# Patient Record
Sex: Male | Born: 1935 | Race: White | Hispanic: No | Smoking: Never smoker
Health system: Southern US, Community
[De-identification: ages and names within clinical notes are randomized; demographics above are authoritative.]

## PROBLEM LIST (undated history)

## (undated) DIAGNOSIS — C801 Malignant (primary) neoplasm, unspecified: Secondary | ICD-10-CM

## (undated) DIAGNOSIS — N400 Enlarged prostate without lower urinary tract symptoms: Secondary | ICD-10-CM

## (undated) DIAGNOSIS — R112 Nausea with vomiting, unspecified: Secondary | ICD-10-CM

## (undated) DIAGNOSIS — I499 Cardiac arrhythmia, unspecified: Secondary | ICD-10-CM

## (undated) DIAGNOSIS — E785 Hyperlipidemia, unspecified: Secondary | ICD-10-CM

## (undated) DIAGNOSIS — Z9889 Other specified postprocedural states: Secondary | ICD-10-CM

## (undated) DIAGNOSIS — H409 Unspecified glaucoma: Secondary | ICD-10-CM

## (undated) DIAGNOSIS — S92101A Unspecified fracture of right talus, initial encounter for closed fracture: Secondary | ICD-10-CM

## (undated) DIAGNOSIS — I10 Essential (primary) hypertension: Secondary | ICD-10-CM

## (undated) HISTORY — DX: Hyperlipidemia, unspecified: E78.5

## (undated) HISTORY — DX: Malignant (primary) neoplasm, unspecified: C80.1

## (undated) HISTORY — PX: BLADDER SURGERY: SHX569

## (undated) HISTORY — PX: TONSILLECTOMY AND ADENOIDECTOMY: SUR1326

## (undated) HISTORY — DX: Essential (primary) hypertension: I10

## (undated) HISTORY — DX: Cardiac arrhythmia, unspecified: I49.9

## (undated) HISTORY — DX: Benign prostatic hyperplasia without lower urinary tract symptoms: N40.0

---

## 2008-12-30 ENCOUNTER — Inpatient Hospital Stay (HOSPITAL_COMMUNITY): Admission: RE | Admit: 2008-12-30 | Discharge: 2009-01-01 | Payer: Self-pay | Admitting: Urology

## 2008-12-30 ENCOUNTER — Encounter (INDEPENDENT_AMBULATORY_CARE_PROVIDER_SITE_OTHER): Payer: Self-pay | Admitting: Urology

## 2009-02-01 ENCOUNTER — Ambulatory Visit: Admission: RE | Admit: 2009-02-01 | Discharge: 2009-05-02 | Payer: Self-pay | Admitting: Radiation Oncology

## 2009-02-04 ENCOUNTER — Ambulatory Visit: Payer: Self-pay | Admitting: Oncology

## 2009-02-15 ENCOUNTER — Encounter (INDEPENDENT_AMBULATORY_CARE_PROVIDER_SITE_OTHER): Payer: Self-pay | Admitting: Urology

## 2009-02-15 ENCOUNTER — Ambulatory Visit (HOSPITAL_COMMUNITY): Admission: RE | Admit: 2009-02-15 | Discharge: 2009-02-16 | Payer: Self-pay | Admitting: Urology

## 2009-03-05 LAB — CBC WITH DIFFERENTIAL/PLATELET
Basophils Absolute: 0 10*3/uL (ref 0.0–0.1)
EOS%: 3.4 % (ref 0.0–7.0)
Eosinophils Absolute: 0.3 10*3/uL (ref 0.0–0.5)
HCT: 43 % (ref 38.4–49.9)
HGB: 14.8 g/dL (ref 13.0–17.1)
MCH: 33 pg (ref 27.2–33.4)
MONO#: 0.6 10*3/uL (ref 0.1–0.9)
NEUT#: 4.7 10*3/uL (ref 1.5–6.5)
NEUT%: 62 % (ref 39.0–75.0)
RDW: 14.2 % (ref 11.0–14.6)
WBC: 7.6 10*3/uL (ref 4.0–10.3)
lymph#: 2 10*3/uL (ref 0.9–3.3)

## 2009-03-05 LAB — COMPREHENSIVE METABOLIC PANEL
AST: 14 U/L (ref 0–37)
BUN: 17 mg/dL (ref 6–23)
Calcium: 9 mg/dL (ref 8.4–10.5)
Chloride: 107 mEq/L (ref 96–112)
Creatinine, Ser: 0.97 mg/dL (ref 0.40–1.50)
Total Bilirubin: 0.8 mg/dL (ref 0.3–1.2)

## 2009-03-05 LAB — LACTATE DEHYDROGENASE: LDH: 149 U/L (ref 94–250)

## 2009-03-17 ENCOUNTER — Ambulatory Visit: Payer: Self-pay | Admitting: Oncology

## 2009-03-19 LAB — COMPREHENSIVE METABOLIC PANEL
Albumin: 4.1 g/dL (ref 3.5–5.2)
BUN: 17 mg/dL (ref 6–23)
Calcium: 8.5 mg/dL (ref 8.4–10.5)
Chloride: 107 mEq/L (ref 96–112)
Glucose, Bld: 155 mg/dL — ABNORMAL HIGH (ref 70–99)
Potassium: 3.4 mEq/L — ABNORMAL LOW (ref 3.5–5.3)

## 2009-03-19 LAB — CBC WITH DIFFERENTIAL/PLATELET
Basophils Absolute: 0 10*3/uL (ref 0.0–0.1)
Eosinophils Absolute: 0.2 10*3/uL (ref 0.0–0.5)
HGB: 14.7 g/dL (ref 13.0–17.1)
MCV: 94.4 fL (ref 79.3–98.0)
NEUT#: 3.8 10*3/uL (ref 1.5–6.5)
RDW: 13.9 % (ref 11.0–14.6)
lymph#: 1.1 10*3/uL (ref 0.9–3.3)

## 2009-04-02 LAB — CBC WITH DIFFERENTIAL/PLATELET
Basophils Absolute: 0 10*3/uL (ref 0.0–0.1)
EOS%: 3.2 % (ref 0.0–7.0)
Eosinophils Absolute: 0.2 10*3/uL (ref 0.0–0.5)
HCT: 41 % (ref 38.4–49.9)
HGB: 14.7 g/dL (ref 13.0–17.1)
MCH: 34.5 pg — ABNORMAL HIGH (ref 27.2–33.4)
MCV: 96.2 fL (ref 79.3–98.0)
NEUT#: 3.1 10*3/uL (ref 1.5–6.5)
NEUT%: 61.4 % (ref 39.0–75.0)
lymph#: 1.2 10*3/uL (ref 0.9–3.3)

## 2009-04-02 LAB — COMPREHENSIVE METABOLIC PANEL
AST: 15 U/L (ref 0–37)
Albumin: 4.1 g/dL (ref 3.5–5.2)
BUN: 16 mg/dL (ref 6–23)
Calcium: 8.3 mg/dL — ABNORMAL LOW (ref 8.4–10.5)
Chloride: 107 mEq/L (ref 96–112)
Creatinine, Ser: 1.12 mg/dL (ref 0.40–1.50)
Glucose, Bld: 98 mg/dL (ref 70–99)
Potassium: 4 mEq/L (ref 3.5–5.3)

## 2009-04-27 ENCOUNTER — Ambulatory Visit: Payer: Self-pay | Admitting: Oncology

## 2009-04-27 LAB — CBC WITH DIFFERENTIAL/PLATELET
BASO%: 0.3 % (ref 0.0–2.0)
Basophils Absolute: 0 10*3/uL (ref 0.0–0.1)
HCT: 38.2 % — ABNORMAL LOW (ref 38.4–49.9)
HGB: 13.8 g/dL (ref 13.0–17.1)
MONO#: 0.4 10*3/uL (ref 0.1–0.9)
NEUT%: 68 % (ref 39.0–75.0)
RDW: 17.3 % — ABNORMAL HIGH (ref 11.0–14.6)
WBC: 4.1 10*3/uL (ref 4.0–10.3)
lymph#: 0.8 10*3/uL — ABNORMAL LOW (ref 0.9–3.3)

## 2009-05-03 ENCOUNTER — Ambulatory Visit: Admission: RE | Admit: 2009-05-03 | Discharge: 2009-06-22 | Payer: Self-pay | Admitting: Radiation Oncology

## 2009-05-28 ENCOUNTER — Ambulatory Visit: Payer: Self-pay | Admitting: Oncology

## 2009-05-28 LAB — CBC WITH DIFFERENTIAL/PLATELET
Basophils Absolute: 0 10*3/uL (ref 0.0–0.1)
Eosinophils Absolute: 0.1 10*3/uL (ref 0.0–0.5)
HGB: 14.3 g/dL (ref 13.0–17.1)
MCV: 98.4 fL — ABNORMAL HIGH (ref 79.3–98.0)
MONO#: 0.5 10*3/uL (ref 0.1–0.9)
MONO%: 9.8 % (ref 0.0–14.0)
NEUT#: 3.3 10*3/uL (ref 1.5–6.5)
RDW: 15.8 % — ABNORMAL HIGH (ref 11.0–14.6)
WBC: 4.8 10*3/uL (ref 4.0–10.3)
lymph#: 0.9 10*3/uL (ref 0.9–3.3)

## 2009-05-28 LAB — COMPREHENSIVE METABOLIC PANEL
BUN: 13 mg/dL (ref 6–23)
CO2: 21 mEq/L (ref 19–32)
Calcium: 8.4 mg/dL (ref 8.4–10.5)
Chloride: 110 mEq/L (ref 96–112)
Creatinine, Ser: 0.99 mg/dL (ref 0.40–1.50)
Glucose, Bld: 109 mg/dL — ABNORMAL HIGH (ref 70–99)

## 2009-06-23 ENCOUNTER — Ambulatory Visit (HOSPITAL_COMMUNITY): Admission: RE | Admit: 2009-06-23 | Discharge: 2009-06-23 | Payer: Self-pay | Admitting: Oncology

## 2009-06-24 ENCOUNTER — Ambulatory Visit: Payer: Self-pay | Admitting: Oncology

## 2009-06-29 LAB — CBC WITH DIFFERENTIAL/PLATELET
Basophils Absolute: 0 10*3/uL (ref 0.0–0.1)
EOS%: 4 % (ref 0.0–7.0)
HCT: 42.4 % (ref 38.4–49.9)
HGB: 15.1 g/dL (ref 13.0–17.1)
MCH: 34.6 pg — ABNORMAL HIGH (ref 27.2–33.4)
MCV: 97.2 fL (ref 79.3–98.0)
MONO%: 9.9 % (ref 0.0–14.0)
NEUT%: 66.6 % (ref 39.0–75.0)
lymph#: 1 10*3/uL (ref 0.9–3.3)

## 2009-06-29 LAB — COMPREHENSIVE METABOLIC PANEL
AST: 16 U/L (ref 0–37)
BUN: 16 mg/dL (ref 6–23)
Calcium: 8.6 mg/dL (ref 8.4–10.5)
Chloride: 109 mEq/L (ref 96–112)
Creatinine, Ser: 0.97 mg/dL (ref 0.40–1.50)
Glucose, Bld: 97 mg/dL (ref 70–99)

## 2009-07-12 ENCOUNTER — Encounter (INDEPENDENT_AMBULATORY_CARE_PROVIDER_SITE_OTHER): Payer: Self-pay | Admitting: Urology

## 2009-07-12 ENCOUNTER — Ambulatory Visit (HOSPITAL_BASED_OUTPATIENT_CLINIC_OR_DEPARTMENT_OTHER): Admission: RE | Admit: 2009-07-12 | Discharge: 2009-07-12 | Payer: Self-pay | Admitting: Urology

## 2009-07-14 ENCOUNTER — Emergency Department (HOSPITAL_COMMUNITY): Admission: EM | Admit: 2009-07-14 | Discharge: 2009-07-14 | Payer: Self-pay | Admitting: Emergency Medicine

## 2010-08-22 ENCOUNTER — Ambulatory Visit (HOSPITAL_BASED_OUTPATIENT_CLINIC_OR_DEPARTMENT_OTHER): Admission: RE | Admit: 2010-08-22 | Discharge: 2010-08-23 | Payer: Self-pay | Admitting: Urology

## 2011-01-04 LAB — POCT I-STAT 4, (NA,K, GLUC, HGB,HCT)
Glucose, Bld: 102 mg/dL — ABNORMAL HIGH (ref 70–99)
HCT: 47 % (ref 39.0–52.0)
Hemoglobin: 16 g/dL (ref 13.0–17.0)
Potassium: 4.3 meq/L (ref 3.5–5.1)
Sodium: 143 meq/L (ref 135–145)

## 2011-01-09 ENCOUNTER — Ambulatory Visit (INDEPENDENT_AMBULATORY_CARE_PROVIDER_SITE_OTHER): Payer: Medicare Other | Admitting: Cardiology

## 2011-01-09 DIAGNOSIS — I119 Hypertensive heart disease without heart failure: Secondary | ICD-10-CM

## 2011-01-09 DIAGNOSIS — C679 Malignant neoplasm of bladder, unspecified: Secondary | ICD-10-CM

## 2011-01-09 DIAGNOSIS — E781 Pure hyperglyceridemia: Secondary | ICD-10-CM

## 2011-01-12 ENCOUNTER — Encounter: Payer: Self-pay | Admitting: Cardiology

## 2011-01-27 LAB — BASIC METABOLIC PANEL
BUN: 17 mg/dL (ref 6–23)
CO2: 25 mEq/L (ref 19–32)
Chloride: 110 mEq/L (ref 96–112)
Creatinine, Ser: 1.09 mg/dL (ref 0.4–1.5)
Glucose, Bld: 97 mg/dL (ref 70–99)

## 2011-01-27 LAB — DIFFERENTIAL
Basophils Relative: 0 % (ref 0–1)
Eosinophils Absolute: 0.1 10*3/uL (ref 0.0–0.7)
Monocytes Absolute: 0.5 10*3/uL (ref 0.1–1.0)
Monocytes Relative: 9 % (ref 3–12)
Neutro Abs: 4.3 10*3/uL (ref 1.7–7.7)

## 2011-01-27 LAB — POCT I-STAT 4, (NA,K, GLUC, HGB,HCT)
Potassium: 3.7 mEq/L (ref 3.5–5.1)
Sodium: 141 mEq/L (ref 135–145)

## 2011-01-27 LAB — CBC
MCHC: 34.3 g/dL (ref 30.0–36.0)
MCV: 100.1 fL — ABNORMAL HIGH (ref 78.0–100.0)
Platelets: 141 10*3/uL — ABNORMAL LOW (ref 150–400)

## 2011-01-27 LAB — CK TOTAL AND CKMB (NOT AT ARMC): Relative Index: INVALID (ref 0.0–2.5)

## 2011-01-30 ENCOUNTER — Other Ambulatory Visit: Payer: Self-pay | Admitting: Urology

## 2011-01-30 ENCOUNTER — Ambulatory Visit (HOSPITAL_BASED_OUTPATIENT_CLINIC_OR_DEPARTMENT_OTHER)
Admission: RE | Admit: 2011-01-30 | Discharge: 2011-01-30 | Disposition: A | Discharge status: Home / Self Care | Payer: Medicare Other | Source: Ambulatory Visit | Attending: Urology | Admitting: Urology

## 2011-01-30 DIAGNOSIS — C679 Malignant neoplasm of bladder, unspecified: Secondary | ICD-10-CM | POA: Insufficient documentation

## 2011-01-30 DIAGNOSIS — Z01812 Encounter for preprocedural laboratory examination: Secondary | ICD-10-CM | POA: Insufficient documentation

## 2011-01-30 DIAGNOSIS — Z79899 Other long term (current) drug therapy: Secondary | ICD-10-CM | POA: Insufficient documentation

## 2011-01-30 DIAGNOSIS — C669 Malignant neoplasm of unspecified ureter: Secondary | ICD-10-CM | POA: Insufficient documentation

## 2011-01-30 DIAGNOSIS — K219 Gastro-esophageal reflux disease without esophagitis: Secondary | ICD-10-CM | POA: Insufficient documentation

## 2011-01-30 DIAGNOSIS — I10 Essential (primary) hypertension: Secondary | ICD-10-CM | POA: Insufficient documentation

## 2011-01-30 DIAGNOSIS — C659 Malignant neoplasm of unspecified renal pelvis: Secondary | ICD-10-CM | POA: Insufficient documentation

## 2011-01-30 LAB — POCT I-STAT 4, (NA,K, GLUC, HGB,HCT)
Glucose, Bld: 97 mg/dL (ref 70–99)
HCT: 44 % (ref 39.0–52.0)
Hemoglobin: 15 g/dL (ref 13.0–17.0)
Sodium: 143 mEq/L (ref 135–145)

## 2011-02-01 LAB — BASIC METABOLIC PANEL
BUN: 11 mg/dL (ref 6–23)
Calcium: 8.4 mg/dL (ref 8.4–10.5)
GFR calc non Af Amer: >60 mL/min (ref >60)
Potassium: 3.6 mEq/L (ref 3.5–5.1)

## 2011-02-01 LAB — HEMOGLOBIN AND HEMATOCRIT, BLOOD: Hemoglobin: 13.7 g/dL (ref 13.0–17.0)

## 2011-02-02 LAB — BASIC METABOLIC PANEL
BUN: 10 mg/dL (ref 6–23)
BUN: 17 mg/dL (ref 6–23)
CO2: 24 mEq/L (ref 19–32)
Calcium: 7.8 mg/dL — ABNORMAL LOW (ref 8.4–10.5)
Calcium: 9.2 mg/dL (ref 8.4–10.5)
Chloride: 113 mEq/L — ABNORMAL HIGH (ref 96–112)
Creatinine, Ser: 1.05 mg/dL (ref 0.4–1.5)
Creatinine, Ser: 1.1 mg/dL (ref 0.4–1.5)
GFR calc Af Amer: >60 mL/min (ref >60)
GFR calc non Af Amer: >60 mL/min (ref >60)
Glucose, Bld: 106 mg/dL — ABNORMAL HIGH (ref 70–99)
Glucose, Bld: 122 mg/dL — ABNORMAL HIGH (ref 70–99)
Sodium: 142 mEq/L (ref 135–145)

## 2011-02-15 NOTE — Op Note (Signed)
[name], [name]                 ACCOUNT NO.:  [id number]  MEDICAL RECORD NO.:  [id number]          PATIENT TYPE:  AMB  LOCATION:  [facility]                         FACILITY:  [facility]  PHYSICIAN:  Valetta Fuller, M.D.  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  PROCEDURE PERFORMED:  Cystoscopy, left double-J stent removal, left retrograde pyelogram, rigid and flexible ureteroscopy of the left collecting system,  washings for cytology of the bladder and left renal pelvis.  SURGEON:  Valetta Fuller, M.D.  ANESTHESIA:  General.  INDICATIONS:  Mr. [name] is 77 years of age.  He has a history of transitional cell carcinoma of the bladder as well as some low-grade papillary tumor involving his left proximal ureter.  The patient underwent previous ureteroscopic assessment with biopsy and fulguration. The patient  did not want more aggressive treatment for his transitional cell carcinoma.  The patient has also had recurrent transitional cell carcinoma of the bladder and on his last biopsy did have low-grade papillary transitional cell carcinoma.  The patient is status post a 6- week course of BCG therapy with hope that he would have some reflux of the BCG up his double-J stent.  He has done well clinically and presents now for reassessment.  TECHNIQUE AND FINDINGS:  The patient was brought to the operating room where he had successful induction of general anesthesia.  He received perioperative ciprofloxacin and PAS compression boots.  He was placed in lithotomy position and prepped and draped in the usual manner. Appropriate surgical time-out was performed.  A cystoscopy was performed.  The patient had  minimal visual obstruction at the level of the prostatic urethra.  Bladder neck was otherwise relatively open.  Left double-J stent was seen in good position with just some mild erythema and mucosal edema around the orifice.  The bladder was carefully panendoscoped and no evidence of obvious recurrent tumor was noted.  A saline barbotage was done for cytologic analysis.  The left double-J stent was removed.  We then performed retrograde pyelography of the left ureter with fluoroscopic interpretation.  There was no evidence of obvious filling defect or obstruction.  A guidewire was placed up to the left renal pelvis.  Rigid ureteroscopy was utilized to assess the distal ureter and there was no evidence of obvious tumor.  An access sheath was placed and the digital flexible ureteroscope was then utilized to assess the proximal ureter and collecting system.  There were some areas of increased mucosal edema and erythema mostly consistent with inflammatory change.  I could see no definitive evidence of any residual papillary tumor.  Saline barbotage was utilized after removal of the flexible ureteroscope with an open-ended catheter to obtain cytology from the left renal pelvis.  We did not feel that left double-J stent replacement was necessary and the patient was brought to recovery room in stable  condition.     Valetta Fuller, M.D.     DSG/MEDQ  D:  01/30/2011  T:  01/30/2011  Job:  [id number]  Electronically Signed by Barron Alvine M.D. on 02/15/2011 11:21:52 AM

## 2011-02-22 ENCOUNTER — Other Ambulatory Visit: Payer: Self-pay | Admitting: *Deleted

## 2011-02-22 DIAGNOSIS — I10 Essential (primary) hypertension: Secondary | ICD-10-CM

## 2011-02-22 MED ORDER — LISINOPRIL 10 MG PO TABS: 10.0000 mg | ORAL_TABLET | Freq: Two times a day (BID) | ORAL | Status: DC

## 2011-02-22 NOTE — Telephone Encounter (Signed)
Refilled rx. For lisinopril 10mg  bid

## 2011-03-07 NOTE — Op Note (Signed)
[name], [name]                 ACCOUNT NO.:  [id number]   MEDICAL RECORD NO.:  [id number]          PATIENT TYPE:  OIB   LOCATION:  1610                         FACILITY:  [facility]   PHYSICIAN:  Valetta Fuller, M.D.  DATE OF BIRTH:  04-15-1923   DATE OF PROCEDURE:  DATE OF DISCHARGE:                               OPERATIVE REPORT   PREOPERATIVE DIAGNOSIS:  Muscle-invasive bladder cancer.   POSTOPERATIVE DIAGNOSIS:  Muscle-invasive bladder cancer.   PROCEDURE PERFORMED:  1. Cystourethroscopy.  2. Exchange of left ureteral stent.  3. Removal of right ureteral stent.  4. Transurethral resection of bladder tumor, medium size.   SURGEON:  Valetta Fuller, M.D.   RESIDENT:  [name] __________.   ANESTHESIA:  General.   DRAINS:  A 22 French 3-way Foley catheter.   ESTIMATED BLOOD LOSS:  Minimal.   COMPLICATIONS:  None.   INDICATIONS FOR PROCEDURE:  Patient is an 77 year old gentleman who is  status post TURBT about 4 weeks ago, at which point the last tumor  burden was seen involving the trigone, the left lateral bladder wall,  and also the right trigone.  The patient's pathology from last year  showed muscle-invasive bladder cancer.  At that resection, patient  apparently had ureteral stents placed.  Patient was most recently  evaluated by Dr. Isabel Caprice in the clinic, at which point, different  treatment options were discussed.  Concerning the patient's  comorbidities and age, it was decided that the patient will undergo  chemoradiation, for which he has been referred to the radiation  oncologist.  Also, it was decided that the patient will undergo second-  look TURBT to assess residual tumor burden and left stent change.  The  risks and benefits of the procedure were explained, and informed consent  obtained.   DESCRIPTION OF PROCEDURE IN DETAIL:  Patient was brought to the  operating room, placed in supine position.  Anesthesia was administered  by the anesthesia team.  A  proper time-out was performed, identifying  the correct patient, procedure, and the site.  Patient was given a  preoperative antibiotics.  Patient was subsequently placed in the dorsal  lithotomy position.  Pressure points were well padded.  Patient was  appropriately draped in the usual sterile manner.  Using a 22 French  sheath and a 12-degree lens, we then performed cystourethroscopy.  The  patient's anterior urethra was normal.  The posterior urethra revealed  moderate bilevel prostatic enlargement.  Upon entering the bladder, a  distal course of bilateral ureteral stents were seen fluxing the right  and left ureteral orifices.  On careful inspection, it was seen that he  had 2 cm sized papillary lesions along the lateral aspect of the left  ureteral orifice.  Also, erythematous and edematous mucosa was seen  around the left ureteral orifice and left trigone and left lateral  bladder wall.  Then using a flexible forceps, externalized the lower  uterine stent and over a Glidewire, changed the left ureteral stent to a  7x 24cm contour stent.  Good proximal curl was seen.  We then removed  the right ureteral stent.  We then performed urethral dilatation using  male urethral sounds to a 30 [place].  We then introduced a 28 French  resectoscope sheath over an obturator and introduced the resectoscope.  We then sequentially performed resection to the 1 cm papillary lesion  along the lateral aspect of the left ureteral orifice.  We then also  resected erythematous mucosa along the medial aspect of the left  ureteral orifice.  We then fulgurated all the concerned areas of  resection, the base and the edges, and the erythematous mucosa, as  described above.  We then examined the area, and there was no active  bleeding.  We then emptied the patient's bladder, removed the  resectoscope, and placed a [place] 3-way Foley catheter.  Irrigated the  bladder.  This marked the end of the procedure.   Patient was  subsequently transferred in stable condition to the recovery room.  Dr.  Isabel Caprice was present and available for all the aspects of the case.      Delman Kitten, MD      Valetta Fuller, M.D.  Electronically Signed    DW/MEDQ  D:  02/15/2009  T:  02/15/2009  Job:  [id number]

## 2011-03-07 NOTE — Discharge Summary (Signed)
NAMEFIN, HUPP                 ACCOUNT NO.:  [id number]   MEDICAL RECORD NO.:  [id number]          PATIENT TYPE:  INP   LOCATION:  1444                         FACILITY:  [facility]   PHYSICIAN:  Valetta Fuller, M.D.  DATE OF BIRTH:  [birth year]   DATE OF ADMISSION:  12/30/2008  DATE OF DISCHARGE:  01/01/2009                               DISCHARGE SUMMARY   ADMISSION DIAGNOSIS:  Bladder mass.   DISCHARGE DIAGNOSIS:  Bladder cancer.   SERVICE:  Patient was admitted under Dr. Isabel Caprice, Urology.   CONSULTS:  Home health services was requested.   PROCEDURES:  Patient underwent cystourethroscopy, bilateral ureteral  stent placement, left retrograde pyelogram and transurethral resection  of bladder tumor on 12/30/2008.   HISTORY:  Patient is an 77 year old gentleman who was evaluated by Dr.  Isabel Caprice in the Urology Clinic for recurrent microhematuria.  Patient had  an office cystoscopy which revealed extensive clots in his bladder.  The  patient was consulted about different treatment options, was brought in  now to explain risks and benefits and to obtain informed consent for  cystourethroscopy, bilateral retrograde and transurethral resection of  bladder tumor.   COURSE:  Patient underwent uncomplicated procedure on 12/30/2008.  Patient during the surgery had bilateral ureteral stents placed and  extensive resection of bladder mass.  Postoperatively, patient after a  brief stay in recovery room was transferred to regular floor bed.  Patient postoperatively had a 3-way Foley catheter placed status post  bladder irrigation.  His postoperative course was uncomplicated.  His  continuous bladder irrigation was weaned off on the morning of  postoperative day.  Patient, however, because of his extensive nature of  bladder involvement by the mass underwent a CT scan of the abdomen and  pelvis and chest x-ray for metastatic workup.  The CT scan of the  abdomen and pelvis revealed bilateral  atelectasis with bilateral  hydronephrosis, although there was Foley catheter in place in the  bladder with decompressed bladder.  There was some concern about  contrast extravasating in the retro-pubic space.  This was discussed  with the patient and his family.  At that point, decision was made to  send patient home with Foley catheter to straight drain.  The urine was  clear requiring no irrigation.  The patient has poor social support, is  being taken care of by his sister as this point so home health services  were consulted.   Discharge condition  Stable.   DISPOSITION:   MEDICATIONS:  The patient was sent home on:  1. Vicodin.  2. Ciprofloxacin.  3. Colace.   INSTRUCTIONS:  Patient has been instructed to give [place] call if he  develops a high grade temperature of 101.5, intractable nausea,  vomiting, intolerable pain, any difficulty with catheter.   FOLLOWUP:  Patient will be scheduled a followup appointment with Dr.  Isabel Caprice in 1-2 weeks for catheter removal.     ______________________________  Georgeanna Lea, MD      Valetta Fuller, M.D.  Electronically Signed    JJ/MEDQ  D:  01/27/2009  T:  01/27/2009  Job:  [id number]

## 2011-03-07 NOTE — Op Note (Signed)
[name], KNEE                 ACCOUNT NO.:  [id number]   MEDICAL RECORD NO.:  [id number]          PATIENT TYPE:  AMB   LOCATION:  DAY                          [facility]AC[facility]   PHYSICIAN:  Valetta Fuller, M.D.  DATE OF BIRTH:  1923/07/10   DATE OF PROCEDURE:  12/30/2008  DATE OF DISCHARGE:                               OPERATIVE REPORT   PREOPERATIVE DIAGNOSIS:  Bladder mass.   POSTOPERATIVE DIAGNOSIS:  Bladder mass.   PROCEDURE PERFORMED:  1. Cystourethroscopy.  2. Bilateral ureteral stent placement, double J.  3. Left retrograde pyelogram.  4. Transurethral resection of bladder tumor, large greater than 5 cm.   SURGEON:  Valetta Fuller, MD.   RESIDENT:  Georgeanna Lea, MD.   ANESTHESIA:  General.   COMPLICATIONS:  None.   DRAIN:  24-French 3-way Foley catheter.   INDICATIONS FOR PROCEDURE:  The patient is an 77 year old gentleman, who  was recently evaluated for complaints of recurrent microhematuria.  The  patient 4 or 5 years ago had a workup for microhematuria, which was  negative.  This time about a month ago was seen in the Urology Clinic at  which point he had a pathology, which was atypical cells and offered  cystoscopy, which revealed bladder mass involving the left trigone and  bladder wall area extending into the bladder neck area.  The patient was  counseled about different treatment options but explained would probably  need an extensive resection with or without ureteral stents.  The risks  and benefits of the procedure were explained and informed consent  obtained.   DESCRIPTION OF PROCEDURE IN DETAIL:  The patient was brought to the  operating room.  Placed in the supine position.  Administered general  anesthesia by the Anesthesia Team.  Appropriate time-out was performed  to identify correct the patient, procedure, and the site.  The patient  was subsequently placed in the dorsal lithotomy position and pressure  points were well-padded,  bilateral lower extremities SCDs were applied.  The patient was prepped and draped in the usual sterile fashion.  Using  a 22-French sheath and a 12-degree lens, we then performed  cystourethroscopy.  The patient's anterior urethra was normal.  The  posterior urethra revealed a moderate to severe bilobar prostate  enlargement.  Upon entering the bladder, at the bladder neck we could  see papillary tissue coming out of the left side of the bladder neck  area.  Once we entered the bladder, we could appreciate a large  papillary lesion involving the trigone, the left lateral bladder wall,  base of the bladder extending onto the posterior bladder wall.  With  much difficulty after giving patient an indigo carmine, then we were  able to identify the right ureteric orifice.  The rest of the cystoscopy  did not reveal any mass lesion or stone.  We cannulated the right  ureteric orifice with glide-tip guidewire and advanced a 4.8 into 24-cm  stent in the right renal pelvis under fluoroscopic guidance.  We then  looked for the left ureteric orifice, however, we were not able  to  identify the left ureteric orifice.  At that point, we removed the  cystoscope.  We then performed urethral dilatation with the urethral  meatal sounds, sounding from 28 to 32 [place].  We then were able to  advance a 28-French resectoscopic sheath into the bladder.  We then  placed the rest of the resectoscope up the bladder.  With continuous  flow, we then reevaluated the tissue, again confirmed the bladder mass.  We then sequentially performed resection of this bladder mass starting  at the left lateral edge.  From time to time, we were able to resect the  bladder mass and remove it and then obtain coagulation.  We kept looking  for a left ureteric orifice.  The patient had to be given indigo carmine  once more and once we had most of the bladder mass and bleeding was  coagulated, we were able to find a pretty dilated  opening at the spot  where the left ureteric orifice would be.  We then removed the  resectoscope, repositioned the cystoscope, and performed a left  retrograde pyelogram after cannulating what seemed like the left  ureteric orifice.  The left retrograde pyelogram revealed a dilated  system.  The contrast initially could just go up to the midureter.  We  then pulled the Glidewire, advanced the end-hole catheter up to be in  the left renal pelvis, and performed the rest of the pyelogram, again  revealing a dilated tortuous system.  We then placed a Glidewire into  the left renal pelvis and again over the Glidewire we were able to place  six 24 double-J ureteral stent into the left renal pelvis under  fluoroscopy.  We then once again examined the resection site for any  residual tumor.  Any residual tumor that was seen was coagulated.  We  also coagulated the base and the edge of the resection.  There was no  active bleeding seen.  At this point, we removed the resectoscope and  placed a 24-French 3-way Foley catheter and irrigated until light pink.  This marked the end of the procedure.  The patient was subsequently  extubated and transferred in stable condition to the recovery room.  Note that Dr. Isabel Caprice was present and available for all the aspects of  the case.     ______________________________  Lovie Macadamia, M.D.      Valetta Fuller, M.D.  Electronically Signed    JJ/MEDQ  D:  12/30/2008  T:  12/30/2008  Job:  [id number]

## 2011-06-07 ENCOUNTER — Other Ambulatory Visit: Payer: Self-pay | Admitting: Cardiology

## 2011-06-07 DIAGNOSIS — I10 Essential (primary) hypertension: Secondary | ICD-10-CM

## 2011-06-07 DIAGNOSIS — E785 Hyperlipidemia, unspecified: Secondary | ICD-10-CM

## 2011-06-07 DIAGNOSIS — I499 Cardiac arrhythmia, unspecified: Secondary | ICD-10-CM

## 2011-06-07 DIAGNOSIS — N400 Enlarged prostate without lower urinary tract symptoms: Secondary | ICD-10-CM

## 2011-06-09 ENCOUNTER — Ambulatory Visit (INDEPENDENT_AMBULATORY_CARE_PROVIDER_SITE_OTHER): Payer: Medicare Other | Admitting: Cardiology

## 2011-06-09 ENCOUNTER — Other Ambulatory Visit (INDEPENDENT_AMBULATORY_CARE_PROVIDER_SITE_OTHER): Payer: Medicare Other | Admitting: *Deleted

## 2011-06-09 ENCOUNTER — Other Ambulatory Visit: Payer: Medicare Other | Admitting: *Deleted

## 2011-06-09 ENCOUNTER — Encounter: Payer: Self-pay | Admitting: Cardiology

## 2011-06-09 VITALS — BP 136/80 | HR 72 | Wt 179.0 lb

## 2011-06-09 DIAGNOSIS — I10 Essential (primary) hypertension: Secondary | ICD-10-CM

## 2011-06-09 DIAGNOSIS — E785 Hyperlipidemia, unspecified: Secondary | ICD-10-CM

## 2011-06-09 DIAGNOSIS — I493 Ventricular premature depolarization: Secondary | ICD-10-CM

## 2011-06-09 DIAGNOSIS — N400 Enlarged prostate without lower urinary tract symptoms: Secondary | ICD-10-CM

## 2011-06-09 DIAGNOSIS — I4949 Other premature depolarization: Secondary | ICD-10-CM

## 2011-06-09 DIAGNOSIS — I499 Cardiac arrhythmia, unspecified: Secondary | ICD-10-CM

## 2011-06-09 LAB — HEPATIC FUNCTION PANEL
ALT: 20 U/L (ref 0–53)
AST: 16 U/L (ref 0–37)
Albumin: 4 g/dL (ref 3.5–5.2)
Alkaline Phosphatase: 45 U/L (ref 39–117)
Total Protein: 6.8 g/dL (ref 6.0–8.3)

## 2011-06-09 LAB — BASIC METABOLIC PANEL
CO2: 29 mEq/L (ref 19–32)
Calcium: 8.7 mg/dL (ref 8.4–10.5)
Chloride: 108 mEq/L (ref 96–112)
Glucose, Bld: 103 mg/dL — ABNORMAL HIGH (ref 70–99)
Potassium: 4.5 mEq/L (ref 3.5–5.1)
Sodium: 142 mEq/L (ref 135–145)

## 2011-06-09 LAB — LIPID PANEL: HDL: 33.3 mg/dL — ABNORMAL LOW (ref >39.00)

## 2011-06-09 NOTE — Progress Notes (Signed)
Bayard Hugger Date of Birth:  December 13, 1922 [facility] / [facility] [address]   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  He has a past history of asymptomatic PVCs and a past history of hypercholesterolemia.  He also has a history of an enlarged prostate.  He's also had a past history of bladder cancer treated by Dr. Isabel Caprice.  He denies any chest pain or shortness of breath.He has had no dizziness or syncope  Current Outpatient Prescriptions  Medication Sig Dispense Refill  . aspirin 81 MG tablet Take 81 mg by mouth 2 (two) times daily.       [name] atorvastatin (LIPITOR) 10 MG tablet Take 10 mg by mouth 2 (two) times daily.       [name] dutasteride (AVODART) 0.5 MG capsule Take 0.5 mg by mouth daily.        [name] lisinopril (PRINIVIL,ZESTRIL) 10 MG tablet Take 1 tablet (10 mg total) by mouth 2 (two) times daily.  60 tablet  11  . omeprazole (PRILOSEC) 20 MG capsule Take 20 mg by mouth daily.          No Known Allergies  There is no problem list on file for this patient.   History  Smoking status  . Never Smoker   Smokeless tobacco  . Not on file    History  Alcohol Use No    Family History  Problem Relation Age of Onset  . Heart attack Father 108    deceased from mi  . Hypertension Father   . Stroke Father   . Breast cancer Mother 75    breast cancer  . Cancer Mother     Review of Systems: The patient denies any heat or cold intolerance.  No weight gain or weight loss.  The patient denies headaches or blurry vision.  There is no cough or sputum production.  The patient denies dizziness.  There is no hematuria or hematochezia.  The patient denies any muscle aches or arthritis.  The patient denies any rash.  The patient denies frequent falling or instability.  There is no history of depression or anxiety.  All other systems were reviewed  and are negative.   Physical Exam: Filed Vitals:   06/09/11 0827  BP: 136/80  Pulse: 72  The general appearance feels a well-developed well-nourished gentleman in no distress.The head and neck exam reveals pupils equal and reactive.  Extraocular movements are full.  There is no scleral icterus.  The mouth and pharynx are normal.  The neck is supple.  The carotids reveal no bruits.  The jugular venous pressure is normal.  The  thyroid is not enlarged.  There is no lymphadenopathy.  The chest is clear to percussion and auscultation.  There are no rales or rhonchi.  Expansion of the chest is symmetrical.  The precordium is quiet.  The first heart sound is normal.  The second heart sound is physiologically split.  There is no murmur gallop rub or click.  There is no abnormal lift or heave.  The abdomen is soft and nontender.  The bowel sounds are normal.  The liver and spleen are not enlarged.  There are no abdominal masses.  There are no abdominal bruits.  Extremities reveal good pedal pulses.  There is no phlebitis or edema.  There is  no cyanosis or clubbing.  Strength is normal and symmetrical in all extremities.  There is no lateralizing weakness.  There are no sensory deficits.  The skin is warm and dry.  There is no rash.      Assessment / Plan: Continue same medication.  Recheck in 6 months.  Blood work today pending

## 2011-06-09 NOTE — Assessment & Plan Note (Signed)
Patient has a history of dyslipidemia.  He is on low dose Lipitor.  He's not having any side effects.

## 2011-06-09 NOTE — Assessment & Plan Note (Signed)
Patient has a history of PVCs.  He has not had any dizziness or syncope associated with his heart.  He has not really been aware of the PVCs himself and it does not seem to affect his ability to ambulate and get around his forearm.

## 2011-06-12 ENCOUNTER — Telehealth: Payer: Self-pay | Admitting: *Deleted

## 2011-06-12 NOTE — Telephone Encounter (Signed)
Advised of labs 

## 2011-06-12 NOTE — Telephone Encounter (Signed)
Message copied by Eugenia Pancoast on Mon Jun 12, 2011 10:03 AM ------      Message from: Cassell Clement      Created: Sat Jun 10, 2011  8:00 PM       TG 160 slightly high.  Watch carbs.      Liver and kidneys are okay

## 2011-07-31 DIAGNOSIS — H2510 Age-related nuclear cataract, unspecified eye: Secondary | ICD-10-CM | POA: Insufficient documentation

## 2011-12-15 ENCOUNTER — Ambulatory Visit (INDEPENDENT_AMBULATORY_CARE_PROVIDER_SITE_OTHER): Payer: Medicare Other | Admitting: Cardiology

## 2011-12-15 ENCOUNTER — Encounter: Payer: Self-pay | Admitting: Cardiology

## 2011-12-15 ENCOUNTER — Other Ambulatory Visit (INDEPENDENT_AMBULATORY_CARE_PROVIDER_SITE_OTHER): Payer: Medicare Other

## 2011-12-15 VITALS — BP 100/86 | HR 70 | Ht 66.0 in | Wt 184.0 lb

## 2011-12-15 DIAGNOSIS — I493 Ventricular premature depolarization: Secondary | ICD-10-CM

## 2011-12-15 DIAGNOSIS — E785 Hyperlipidemia, unspecified: Secondary | ICD-10-CM

## 2011-12-15 DIAGNOSIS — I119 Hypertensive heart disease without heart failure: Secondary | ICD-10-CM

## 2011-12-15 DIAGNOSIS — I4949 Other premature depolarization: Secondary | ICD-10-CM

## 2011-12-15 LAB — HEPATIC FUNCTION PANEL
ALT: 25 U/L (ref 0–53)
Total Protein: 6.8 g/dL (ref 6.0–8.3)

## 2011-12-15 LAB — LDL CHOLESTEROL, DIRECT: Direct LDL: 54.2 mg/dL

## 2011-12-15 LAB — BASIC METABOLIC PANEL
BUN: 14 mg/dL (ref 6–23)
GFR: 71.06 mL/min (ref >60.00)
Potassium: 4.1 mEq/L (ref 3.5–5.1)
Sodium: 138 mEq/L (ref 135–145)

## 2011-12-15 LAB — LIPID PANEL
HDL: 31.3 mg/dL — ABNORMAL LOW (ref >39.00)
Triglycerides: 278 mg/dL — ABNORMAL HIGH (ref 0.0–149.0)

## 2011-12-15 NOTE — Assessment & Plan Note (Signed)
The patient has not had any recent PVCs.  He is not having any chest pain or shortness of breath.  He has had no dizziness or syncope

## 2011-12-15 NOTE — Patient Instructions (Signed)
Your physician recommends that you continue on your current medications as directed. Please refer to the Current Medication list given to you today.  Your physician wants you to follow-up in: 6 months. You will receive a reminder letter in the mail two months in advance. If you don't receive a letter, please call our office to schedule the follow-up appointment.  

## 2011-12-15 NOTE — Assessment & Plan Note (Signed)
The patient has a history of dyslipidemia with elevated triglycerides.  His cholesterol has responded nicely to low dose atorvastatin.  He has not been having any myalgias from the atorvastatin.

## 2011-12-15 NOTE — Progress Notes (Signed)
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  He has a history of dyslipidemia and a history of frequent PVCs.  He does not have any history of ischemic heart disease.  He had a normal nuclear stress test in 2005.  His EKGs in the past have shown rate related right bundle branch block pattern.  Current Outpatient Prescriptions  Medication Sig Dispense Refill  . aspirin 81 MG tablet Take 81 mg by mouth 2 (two) times daily.       [name] atorvastatin (LIPITOR) 10 MG tablet Take 10 mg by mouth 2 (two) times daily.       [name] dutasteride (AVODART) 0.5 MG capsule Take 0.5 mg by mouth daily.        [name] lisinopril (PRINIVIL,ZESTRIL) 10 MG tablet Take 1 tablet (10 mg total) by mouth 2 (two) times daily.  60 tablet  11  . omeprazole (PRILOSEC) 20 MG capsule Take 20 mg by mouth daily.        [name] latanoprost (XALATAN) 0.005 % ophthalmic solution as directed.        No Known Allergies  Patient Active Problem List  Diagnoses  . Dyslipidemia  . PVC (premature ventricular contraction)    History  Smoking status  . Never Smoker   Smokeless tobacco  . Not on file    History  Alcohol Use No    Family History  Problem Relation Age of Onset  . Heart attack Father 92    deceased from mi  . Hypertension Father   . Stroke Father   . Breast cancer Mother 37    breast cancer  . Cancer Mother     Review of Systems: The patient denies any heat or cold intolerance.  No weight gain or weight loss.  The patient denies headaches or blurry vision.  There is no cough or sputum production.  The patient denies dizziness.  There is no hematuria or hematochezia.  The patient denies any muscle aches or arthritis.  The patient denies any rash.  The patient denies frequent falling or instability.  There is no  history of depression or anxiety.  All other systems were reviewed and are negative.   Physical Exam: Filed Vitals:   12/15/11 0936  BP: 100/86  Pulse: 70   The general appearance reveals a well-developed well-nourished gentleman in no distress.The head and neck exam reveals pupils equal and reactive.  Extraocular movements are full.  There is no scleral icterus.  The mouth and pharynx are normal.  The neck is supple.  The carotids reveal no bruits.  The jugular venous pressure is normal.  The  thyroid is not enlarged.  There is no lymphadenopathy.  The chest is clear to percussion and auscultation.  There are no rales or rhonchi.  Expansion of the chest is symmetrical.  The precordium is quiet.  The first heart sound is normal.  The second heart sound is physiologically split.  There is no murmur gallop rub or click.  There is no abnormal lift or heave.  The abdomen is soft and nontender.  The bowel sounds are normal.  The liver and spleen are not enlarged.  There are no abdominal masses.  There are no abdominal bruits.  Extremities reveal good pedal pulses.  There is no phlebitis or edema.  There is no cyanosis or clubbing.  Strength is normal and symmetrical in all extremities.  There is no lateralizing weakness.  There are no sensory deficits.  The skin is warm and dry.  There is no rash.     Assessment / Plan:  Continue same medication.  Blood work today pending.  Recheck in 6 months for followup office visit EKG CBC and lipid panel and hepatic function panel and basal metabolic panel

## 2011-12-18 ENCOUNTER — Telehealth: Payer: Self-pay | Admitting: *Deleted

## 2011-12-18 NOTE — Telephone Encounter (Signed)
Message copied by Burnell Blanks on Mon Dec 18, 2011  1:12 PM ------      Message from: Cassell Clement      Created: Mon Dec 18, 2011  8:36 AM       The liver and kidney tests are good.  The cholesterol is excellent.  The triglycerides are still high and I want him to watch his carbohydrate intake carefully.

## 2011-12-18 NOTE — Telephone Encounter (Signed)
Mailed copy of labs and left message to call if any questions  

## 2012-01-26 ENCOUNTER — Other Ambulatory Visit: Payer: Self-pay | Admitting: *Deleted

## 2012-02-01 ENCOUNTER — Other Ambulatory Visit: Payer: Self-pay | Admitting: *Deleted

## 2012-02-01 DIAGNOSIS — E7800 Pure hypercholesterolemia, unspecified: Secondary | ICD-10-CM

## 2012-02-01 MED ORDER — ATORVASTATIN CALCIUM 10 MG PO TABS: 10.0000 mg | ORAL_TABLET | Freq: Every day | ORAL | Status: DC

## 2012-03-13 ENCOUNTER — Other Ambulatory Visit: Payer: Self-pay | Admitting: *Deleted

## 2012-03-13 DIAGNOSIS — I10 Essential (primary) hypertension: Secondary | ICD-10-CM

## 2012-03-13 MED ORDER — LISINOPRIL 10 MG PO TABS: 10.0000 mg | ORAL_TABLET | Freq: Two times a day (BID) | ORAL | Status: DC

## 2012-06-13 ENCOUNTER — Ambulatory Visit (INDEPENDENT_AMBULATORY_CARE_PROVIDER_SITE_OTHER): Payer: Medicare Other | Admitting: Cardiology

## 2012-06-13 ENCOUNTER — Encounter: Payer: Self-pay | Admitting: Cardiology

## 2012-06-13 VITALS — BP 110/78 | HR 58 | Ht 66.0 in | Wt 186.0 lb

## 2012-06-13 DIAGNOSIS — I493 Ventricular premature depolarization: Secondary | ICD-10-CM

## 2012-06-13 DIAGNOSIS — E785 Hyperlipidemia, unspecified: Secondary | ICD-10-CM

## 2012-06-13 DIAGNOSIS — I119 Hypertensive heart disease without heart failure: Secondary | ICD-10-CM

## 2012-06-13 DIAGNOSIS — I4949 Other premature depolarization: Secondary | ICD-10-CM

## 2012-06-13 NOTE — Assessment & Plan Note (Signed)
Patient has a past history of dyslipidemia and is on atorvastatin 10 mg daily.  He denies any myalgias or side effects.  Blood work pending

## 2012-06-13 NOTE — Progress Notes (Signed)
[name] Date of Birth:  May 29, 1923 [facility] 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 He has a history of dyslipidemia and a history of frequent PVCs. He does not have any history of ischemic heart disease. He had a normal nuclear stress test in 2005. His EKGs in the past have shown rate related right bundle branch block pattern. Since last visit he has been feeling well.  He has not been expressing any chest pain or shortness of breath.  No syncope.  No racing of his heart.  His appetite is good and his weight is up 2 pounds.  He does not get any aerobic exercise other than for walking to his mailbox and back.   Current Outpatient Prescriptions  Medication Sig Dispense Refill  . aspirin 81 MG tablet Take 81 mg by mouth 2 (two) times daily.       [name] atorvastatin (LIPITOR) 10 MG tablet Take 1 tablet (10 mg total) by mouth daily.  30 tablet  11  . dutasteride (AVODART) 0.5 MG capsule Take 0.5 mg by mouth daily.        [name] latanoprost (XALATAN) 0.005 % ophthalmic solution as directed.      [name] lisinopril (PRINIVIL,ZESTRIL) 10 MG tablet Take 1 tablet (10 mg total) by mouth 2 (two) times daily.  60 tablet  11  . omeprazole (PRILOSEC) 20 MG capsule Take 20 mg by mouth daily.        . dorzolamide (TRUSOPT) 2 % ophthalmic solution as directed.        No Known Allergies  Patient Active Problem List  Diagnosis  . Dyslipidemia  . PVC (premature ventricular contraction)    History  Smoking status  . Never Smoker   Smokeless tobacco  . Not on file    History  Alcohol Use No    Family History  Problem Relation Age of Onset  . Heart attack Father 63    deceased from mi  . Hypertension Father   . Stroke Father   . Breast cancer Mother 25    breast cancer  . Cancer Mother     Review of Systems: The patient denies any  heat or cold intolerance.  No weight gain or weight loss.  The patient denies headaches or blurry vision.  There is no cough or sputum production.  The patient denies dizziness.  There is no hematuria or hematochezia.  The patient denies any muscle aches or arthritis.  The patient denies any rash.  The patient denies frequent falling or instability.  There is no history of depression or anxiety.  All other systems were reviewed and are negative.   Physical Exam: Filed Vitals:   06/13/12 1046  BP: 110/78  Pulse: 58   general appearance reveals an elderly gentleman in no distress.The head and neck exam reveals pupils equal and reactive.  Extraocular movements are full.  There is no scleral icterus.  The mouth and pharynx are normal.  The neck is supple.  The carotids reveal no bruits.  The jugular venous pressure is normal.  The  thyroid is not enlarged.  There is no lymphadenopathy.  The chest is clear to percussion and auscultation.  There are no rales or rhonchi.  Expansion of the chest is symmetrical.  The precordium is quiet.  The first heart sound is normal.  The second  heart sound is physiologically split.  There is no murmur gallop rub or click.  There is no abnormal lift or heave.  The abdomen is soft and nontender.  The bowel sounds are normal.  The liver and spleen are not enlarged.  There are no abdominal masses.  There are no abdominal bruits.  Extremities reveal good pedal pulses.  There is no phlebitis or edema.  There is no cyanosis or clubbing.  Strength is normal and symmetrical in all extremities.  There is no lateralizing weakness.  There are no sensory deficits.  The skin is warm and dry.  There is no rash.  EKG shows sinus bradycardia with incomplete right bundle branch block and no ischemic changes   Assessment / Plan: Continue same medication.  Recheck in 6 months for office visit lipid panel hepatic function panel and basal metabolic panel.  I have encouraged him to try to get  more aerobic exercise and to watch his diet carefully

## 2012-06-13 NOTE — Patient Instructions (Addendum)
Your physician recommends that you continue on your current medications as directed. Please refer to the Current Medication list given to you today.  Your physician wants you to follow-up in: 6 months with fasting labs (lp/bmet/hfp)  You will receive a reminder letter in the mail two months in advance. If you don't receive a letter, please call our office to schedule the follow-up appointment.  

## 2012-06-13 NOTE — Assessment & Plan Note (Signed)
The patient has not been aware of any recent PVCs or palpitations.  EKG today shows no ectopic rhythm

## 2013-03-14 ENCOUNTER — Other Ambulatory Visit: Payer: Self-pay | Admitting: *Deleted

## 2013-03-14 DIAGNOSIS — E7800 Pure hypercholesterolemia, unspecified: Secondary | ICD-10-CM

## 2013-03-14 MED ORDER — ATORVASTATIN CALCIUM 10 MG PO TABS: 10.0000 mg | ORAL_TABLET | Freq: Every day | ORAL | Status: DC

## 2013-03-21 ENCOUNTER — Other Ambulatory Visit: Payer: Self-pay | Admitting: *Deleted

## 2013-03-21 DIAGNOSIS — I10 Essential (primary) hypertension: Secondary | ICD-10-CM

## 2013-03-21 MED ORDER — LISINOPRIL 10 MG PO TABS: 10.0000 mg | ORAL_TABLET | Freq: Two times a day (BID) | ORAL | Status: DC

## 2013-05-27 ENCOUNTER — Other Ambulatory Visit: Payer: Self-pay | Admitting: *Deleted

## 2013-05-27 DIAGNOSIS — I10 Essential (primary) hypertension: Secondary | ICD-10-CM

## 2013-05-27 MED ORDER — LISINOPRIL 10 MG PO TABS: 10.0000 mg | ORAL_TABLET | Freq: Two times a day (BID) | ORAL | Status: DC

## 2013-06-17 ENCOUNTER — Telehealth: Payer: Self-pay | Admitting: *Deleted

## 2013-06-17 DIAGNOSIS — E7800 Pure hypercholesterolemia, unspecified: Secondary | ICD-10-CM

## 2013-06-17 DIAGNOSIS — I10 Essential (primary) hypertension: Secondary | ICD-10-CM

## 2013-06-17 MED ORDER — LISINOPRIL 10 MG PO TABS: 10.0000 mg | ORAL_TABLET | Freq: Two times a day (BID) | ORAL | Status: DC

## 2013-06-17 MED ORDER — ATORVASTATIN CALCIUM 10 MG PO TABS: 10.0000 mg | ORAL_TABLET | Freq: Every day | ORAL | Status: DC

## 2013-06-17 NOTE — Telephone Encounter (Signed)
Patient came in to pick up Rx for Atorvastatin and Lisinopril to last him until he comes back for his 06/27/13 office visit with Dr. Patty Sermons. Printed Rx out for one month supply only.

## 2013-06-27 ENCOUNTER — Ambulatory Visit (INDEPENDENT_AMBULATORY_CARE_PROVIDER_SITE_OTHER): Payer: Medicare Other | Admitting: Cardiology

## 2013-06-27 ENCOUNTER — Encounter: Payer: Self-pay | Admitting: Cardiology

## 2013-06-27 VITALS — BP 132/68 | HR 60 | Ht 66.0 in | Wt 185.0 lb

## 2013-06-27 DIAGNOSIS — E7800 Pure hypercholesterolemia, unspecified: Secondary | ICD-10-CM

## 2013-06-27 DIAGNOSIS — E785 Hyperlipidemia, unspecified: Secondary | ICD-10-CM

## 2013-06-27 DIAGNOSIS — I493 Ventricular premature depolarization: Secondary | ICD-10-CM

## 2013-06-27 DIAGNOSIS — I4949 Other premature depolarization: Secondary | ICD-10-CM

## 2013-06-27 LAB — BASIC METABOLIC PANEL
BUN: 16 mg/dL (ref 6–23)
Calcium: 8.7 mg/dL (ref 8.4–10.5)
GFR: 70.81 mL/min (ref >60.00)
Glucose, Bld: 93 mg/dL (ref 70–99)
Potassium: 3.7 mEq/L (ref 3.5–5.1)
Sodium: 139 mEq/L (ref 135–145)

## 2013-06-27 LAB — LIPID PANEL
HDL: 27.9 mg/dL — ABNORMAL LOW (ref >39.00)
Triglycerides: 365 mg/dL — ABNORMAL HIGH (ref 0.0–149.0)
VLDL: 73 mg/dL — ABNORMAL HIGH (ref 0.0–40.0)

## 2013-06-27 LAB — HEPATIC FUNCTION PANEL
Albumin: 3.8 g/dL (ref 3.5–5.2)
Total Bilirubin: 0.8 mg/dL (ref 0.3–1.2)

## 2013-06-27 LAB — LDL CHOLESTEROL, DIRECT: Direct LDL: 54.1 mg/dL

## 2013-06-27 NOTE — Patient Instructions (Signed)
Will obtain labs today and call you with the results (lp/bmet/hfp)  Your physician recommends that you continue on your current medications as directed. Please refer to the Current Medication list given to you today.  Your physician wants you to follow-up in: 6 months with fasting labs (lp/bmet/hfp) and ekg You will receive a reminder letter in the mail two months in advance. If you don't receive a letter, please call our office to schedule the follow-up appointment.  

## 2013-06-27 NOTE — Assessment & Plan Note (Signed)
The patient has a history of dyslipidemia and is on low-dose Lipitor 10 mg daily.  He denies any myalgias.  We are checking fasting lab work today.

## 2013-06-27 NOTE — Progress Notes (Signed)
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 He has a history of dyslipidemia and a history of frequent PVCs. He does not have any history of ischemic heart disease. He had a normal nuclear stress test in 2005. His EKGs in the past have shown rate related right bundle branch block pattern.  Since last visit he has been feeling well. He has not been expressing any chest pain or shortness of breath. No syncope. No racing of his heart. His appetite is good but his weight is down 1 pound.  He eats his meals out every day.  He does not Cook at home.  He does not get any aerobic exercise other than for walking to his mailbox and back.   Current Outpatient Prescriptions  Medication Sig Dispense Refill  . aspirin 81 MG tablet Take 81 mg by mouth 2 (two) times daily.       [name] atorvastatin (LIPITOR) 10 MG tablet Take 1 tablet (10 mg total) by mouth daily.  30 tablet  1  . dorzolamide (TRUSOPT) 2 % ophthalmic solution as directed.      . dutasteride (AVODART) 0.5 MG capsule Take 0.5 mg by mouth daily.        [name] latanoprost (XALATAN) 0.005 % ophthalmic solution as directed.      [name] lisinopril (PRINIVIL,ZESTRIL) 10 MG tablet Take 1 tablet (10 mg total) by mouth 2 (two) times daily.  60 tablet  0  . omeprazole (PRILOSEC) 20 MG capsule Take 20 mg by mouth daily.         No current facility-administered medications for this visit.    No Known Allergies  Patient Active Problem List   Diagnosis Date Noted  . Dyslipidemia 06/09/2011  . PVC (premature ventricular contraction) 06/09/2011    History  Smoking status  . Never Smoker   Smokeless tobacco  . Not on file    History  Alcohol Use No    Family History  Problem Relation Age of Onset  . Heart attack Father 71    deceased from mi  .  Hypertension Father   . Stroke Father   . Breast cancer Mother 63    breast cancer  . Cancer Mother     Review of Systems: The patient denies any heat or cold intolerance.  No weight gain or weight loss.  The patient denies headaches or blurry vision.  There is no cough or sputum production.  The patient denies dizziness.  There is no hematuria or hematochezia.  The patient denies any muscle aches or arthritis.  The patient denies any rash.  The patient denies frequent falling or instability.  There is no history of depression or anxiety.  All other systems were reviewed and are negative.   Physical Exam: Filed Vitals:   06/27/13 1504  BP: 132/68  Pulse: 60   the general appearance reveals a well-developed well-nourished hard of hearing elderly gentleman in no acute distress.The head and neck exam reveals pupils equal and reactive.  Extraocular movements are full.  There is no scleral icterus.  The mouth and pharynx are normal.  The neck is supple.  The carotids reveal no bruits.  The jugular venous pressure is normal.  The  thyroid is not enlarged.  There is no lymphadenopathy.  The chest is clear  to percussion and auscultation.  There are no rales or rhonchi.  Expansion of the chest is symmetrical.  The precordium is quiet.  The first heart sound is normal.  The second heart sound is physiologically split.  There is no murmur gallop rub or click.  There is no abnormal lift or heave.  The abdomen is soft and nontender.  The bowel sounds are normal.  The liver and spleen are not enlarged.  There are no abdominal masses.  There are no abdominal bruits.  Extremities reveal good pedal pulses.  There is no phlebitis or edema.  There is no cyanosis or clubbing.  Strength is normal and symmetrical in all extremities.  There is no lateralizing weakness.  There are no sensory deficits.  The skin is warm and dry.  There is no rash.      Assessment / Plan: Continue same medication.  Try to get plenty of  walking exercise.  Recheck 6 months for office visit EKG lipid panel hepatic function panel and basal metabolic panel.  Blood work today is pending

## 2013-06-27 NOTE — Assessment & Plan Note (Signed)
The patient has not been aware of any recent PVCs or palpitations.  No dizziness or syncope.  No chest pain

## 2013-06-27 NOTE — Progress Notes (Signed)
Quick Note:  Please report to patient. The recent labs are stable. Continue same medication and careful diet. Triglycerides are high so he should avoid too many carbohydrates and sweets. Try to get more regular exercise ______

## 2013-06-30 ENCOUNTER — Telehealth: Payer: Self-pay | Admitting: *Deleted

## 2013-06-30 NOTE — Telephone Encounter (Signed)
Message copied by Burnell Blanks on Mon Jun 30, 2013  9:11 AM ------      Message from: Cassell Clement      Created: Fri Jun 27, 2013  7:40 PM       Please report to patient.  The recent labs are stable. Continue same medication and careful diet.  Triglycerides are high so he should avoid too many carbohydrates and sweets.  Try to get more regular exercise ------

## 2013-06-30 NOTE — Telephone Encounter (Signed)
Advised patient of lab results  

## 2013-08-04 ENCOUNTER — Other Ambulatory Visit: Payer: Self-pay

## 2013-08-04 DIAGNOSIS — I10 Essential (primary) hypertension: Secondary | ICD-10-CM

## 2013-08-04 MED ORDER — LISINOPRIL 10 MG PO TABS: 10.0000 mg | ORAL_TABLET | Freq: Two times a day (BID) | ORAL | Status: DC

## 2013-08-12 ENCOUNTER — Other Ambulatory Visit: Payer: Self-pay

## 2013-08-12 DIAGNOSIS — I10 Essential (primary) hypertension: Secondary | ICD-10-CM

## 2013-08-12 MED ORDER — LISINOPRIL 10 MG PO TABS: 10.0000 mg | ORAL_TABLET | Freq: Two times a day (BID) | ORAL | Status: DC

## 2013-08-19 ENCOUNTER — Other Ambulatory Visit: Payer: Self-pay

## 2013-08-19 DIAGNOSIS — E7800 Pure hypercholesterolemia, unspecified: Secondary | ICD-10-CM

## 2013-08-19 MED ORDER — ATORVASTATIN CALCIUM 10 MG PO TABS: 10.0000 mg | ORAL_TABLET | Freq: Every day | ORAL | Status: DC

## 2013-09-19 ENCOUNTER — Other Ambulatory Visit: Payer: Self-pay

## 2013-09-19 DIAGNOSIS — E7800 Pure hypercholesterolemia, unspecified: Secondary | ICD-10-CM

## 2013-09-19 MED ORDER — ATORVASTATIN CALCIUM 10 MG PO TABS: 10.0000 mg | ORAL_TABLET | Freq: Every day | ORAL | Status: DC

## 2013-11-03 ENCOUNTER — Other Ambulatory Visit: Payer: Self-pay | Admitting: Urology

## 2013-11-10 ENCOUNTER — Encounter (HOSPITAL_COMMUNITY): Payer: Self-pay | Admitting: Pharmacy Technician

## 2013-11-10 NOTE — Patient Instructions (Addendum)
20 WASIF SIMONICH  11/10/2013   Your procedure is scheduled on: 11/17/13  Report to [place] at 7:30 AM.  Call this number if you have problems the morning of surgery [phone]: [phone]   Remember:   Do not eat food or drink liquids After Midnight.     Take these medicines the morning of surgery with A SIP OF WATER: lipitor, eye drops (azopt)   Do not wear jewelry, make-up or nail polish.  Do not wear lotions, powders, or perfumes. You may wear deodorant.  Do not shave 48 hours prior to surgery. Men may shave face and neck.  Do not bring valuables to the hospital.  Contacts, dentures or bridgework may not be worn into surgery.     Patients discharged the day of surgery will not be allowed to drive home.  Name and phone number of your driver: Opal Sidles [phone]    Paulette Blanch, RN  pre op nurse call if needed [phone]    FAILURE TO FOLLOW THESE INSTRUCTIONS MAY RESULT IN CANCELLATION OF YOUR SURGERY   Patient Signature: ___________________________________________

## 2013-11-11 ENCOUNTER — Ambulatory Visit (HOSPITAL_COMMUNITY)
Admission: RE | Admit: 2013-11-11 | Discharge: 2013-11-11 | Disposition: A | Discharge status: Home / Self Care | Payer: Medicare Other | Source: Ambulatory Visit | Attending: Urology | Admitting: Urology

## 2013-11-11 ENCOUNTER — Encounter (HOSPITAL_COMMUNITY)
Admission: RE | Admit: 2013-11-11 | Discharge: 2013-11-11 | Disposition: A | Discharge status: Home / Self Care | Payer: Medicare Other | Source: Ambulatory Visit | Attending: Urology | Admitting: Urology

## 2013-11-11 ENCOUNTER — Encounter (HOSPITAL_COMMUNITY): Payer: Self-pay

## 2013-11-11 DIAGNOSIS — Z01818 Encounter for other preprocedural examination: Secondary | ICD-10-CM | POA: Insufficient documentation

## 2013-11-11 DIAGNOSIS — Z01810 Encounter for preprocedural cardiovascular examination: Secondary | ICD-10-CM | POA: Insufficient documentation

## 2013-11-11 DIAGNOSIS — Z01812 Encounter for preprocedural laboratory examination: Secondary | ICD-10-CM | POA: Insufficient documentation

## 2013-11-11 HISTORY — DX: Nausea with vomiting, unspecified: R11.2

## 2013-11-11 HISTORY — DX: Other specified postprocedural states: Z98.890

## 2013-11-11 HISTORY — DX: Unspecified glaucoma: H40.9

## 2013-11-11 LAB — BASIC METABOLIC PANEL
BUN: 13 mg/dL (ref 6–23)
CALCIUM: 9.2 mg/dL (ref 8.4–10.5)
CO2: 25 mEq/L (ref 19–32)
Chloride: 107 mEq/L (ref 96–112)
Creatinine, Ser: 1.09 mg/dL (ref 0.50–1.35)
GFR calc Af Amer: 68 mL/min — ABNORMAL LOW (ref >90)
GFR, EST NON AFRICAN AMERICAN: 59 mL/min — AB (ref >90)
GLUCOSE: 100 mg/dL — AB (ref 70–99)
Potassium: 4.3 mEq/L (ref 3.7–5.3)
SODIUM: 142 meq/L (ref 137–147)

## 2013-11-11 LAB — CBC
HCT: 40.2 % (ref 39.0–52.0)
Hemoglobin: 13.9 g/dL (ref 13.0–17.0)
MCH: 32.7 pg (ref 26.0–34.0)
MCHC: 34.6 g/dL (ref 30.0–36.0)
MCV: 94.6 fL (ref 78.0–100.0)
PLATELETS: 182 10*3/uL (ref 150–400)
RBC: 4.25 MIL/uL (ref 4.22–5.81)
RDW: 13.6 % (ref 11.5–15.5)
WBC: 6.7 10*3/uL (ref 4.0–10.5)

## 2013-11-14 NOTE — H&P (Signed)
Reason For Visit                [name] presents today for followup and also consideration of cystoscopy. His very complicated history is summarized below. 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 Last CT with IV contrast was approximately a year ago in February of 2014 and actually looked fairly good. The patient subsequently refused ongoing routine followup cystoscopy and we said we would see him back if he had any difficulties. For about 3 weeks now, he has had recurrent total painless gross hematuria, which has been fairly persistent. Recent urinalysis here showed too numerous to count red blood cells and urine culture was negative. Repeat urine today continues to show significant degrees of ongoing gross hematuria. He has had no abdominal or flank pain. He has been voiding reasonably well and appears to be emptying his bladder fairly well based on previous postvoid residual checked a couple of weeks ago.     History of Present Illness       PAST GU HX:      Mr. [name] is currently 80 years of age. 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 He is also noted to have bilateral hydronephrosis. He did have tumor involving he left hemi-trigone which did encroach on the right side of the bladder as well. Bilateral double-J stents were placed. On second look biopsy, there was no obvious residual tumor. The patient was treated with chemo and radiation therapy. He finished his radiation in July of 2010. CT scan done in September of 2010 was negative for hydronephrosis, negative for metastatic disease or obvious bladder tumor. He was taken to surgery for cystoscopy which was really totally unremarkable. Random biopsy was negative at that time.        The patient was reassessed endoscopically ( 2011) to both check his  bladder as well as a questionable filling defect in his proximal left ureter. At the time of surgery his bladder looked pretty good but there were some areas of erythema. I did some biopsies which did show some low-grade papillary transitional cell carcinoma without any evidence of invasion. The patient also had ureteroscopy. There I could see a papillary tumor in his proximal left ureter. Biopsies indeed confirmed that there was a papillary tumor. We did the best we could with fulguration and the gross tumor appeared to be taken care of with that procedure, although certainly we could not definitively say that the tumor was completely eradicated. [place] has recovered from that well. He tells me today he does not want any other procedures or operations and certainly I think he is not a particularly good candidate for cystectomy or nephroureterectomy. He lives alone and certainly does not want to end up in a nursing facility. After discussion today, however, I do think we need to strike a balance between ignoring this problem and seeing what we can do on a continued endoscopic basis to keep things under control and most importantly keep his quality of life as high as possible and keep him from having further problems or issues.     Cytology checked in February 2013 was negative. CT scan with IV contrast showed some proximal ureteral narrowing but no evidence of obvious obstruction or tumor at that time. That was also done in February 2013. The patient again did get  BCG weekly x6 weeks completed in January 2012.    Patient underwent repeat CT imaging of the abdomen and pelvis with contrast in February 2014. There is no evidence of significant obstruction/hydronephrosis and the bladder was without evidence of obvious mass/recurrence. There is no evidence of metastatic disease. The patient refused cystoscopy at that time.     Past Medical History Problems  1. History of glaucoma (V12.49) 2. History of  heartburn (V12.79) 3. History of hypercholesterolemia (V12.29) 4. History of hypertension (V12.59)  Surgical History Problems  1. History of Cystoscopy With Biopsy 2. History of Cystoscopy With Biopsy 3. History of Cystoscopy With Fulguration Large Lesion (Over 5cm) 4. History of Cystoscopy With Fulguration Medium Lesion (2-5cm) 5. History of Cystoscopy With Insertion Of Ureteral Stent Bilateral 6. History of Cystoscopy With Insertion Of Ureteral Stent Left 7. History of Cystoscopy With Insertion Of Ureteral Stent Left 8. History of Cystoscopy With Ureteroscopy For Biopsy Left 9. History of Cystoscopy With Ureteroscopy Left 10. History of Tonsillectomy  Current Meds 1. Aspirin 81 MG Oral Tablet; 2 tablets 1 x daily;  Therapy: (Recorded:27Jul2012) to Recorded 2. Atorvastatin Calcium 10 MG Oral Tablet;  Therapy: [id number] to Recorded 3. Avodart 0.5 MG Oral Capsule; TAKE 1 CAPSULE BY MOUTH  EVERY DAY;  Therapy: 30Sep2008 to (Evaluate:24Feb2015)  Requested for: [phone]; Last  Rx:26Nov2014 Ordered 4. Azopt 1 % Ophthalmic Suspension; 1 drop in the left eye twice daily;  Therapy: [id number] to Recorded 5. Dorzolamide HCl - 2 % Ophthalmic Solution;  Therapy: [phone] to Recorded 6. Latanoprost 0.005 % Ophthalmic Solution;  Therapy: 28Apr2011 to Recorded 7. Lisinopril 10 MG Oral Tablet; 1 per day;  Therapy: (Recorded:27Jul2012) to Recorded 8. PriLOSEC OTC TBEC; 20mg ; 1 per day;  Therapy: (Recorded:27Jul2012) to Recorded 9. VESIcare 5 MG Oral Tablet; Take 1 tablet daily;  Therapy: 20Apr2012 to (Evaluate:20Apr2013)  Requested for: 20Apr2012; Last  Rx:20Apr2012 Ordered  Allergies Medication  1. No Known Drug Allergies  Family History Problems  1. Family history of Acute Myocardial Infarction (V17.3) : Father 2. Family history of Breast Cancer (V16.3) : Mother 3. Family history of Death In The Family Father   37yrs, MI 4. Family history of Death In The Family Mother   2yrs,  breast cancer 5. Denied: Family history of Family Health Status Number Of Children 6. Family history of Hypertension (V17.49) : Father  Social History Problems  1. Denied: History of Alcohol Use 2. Denied: History of Caffeine Use 3. Marital History - Single 4. Never A Smoker 5. Occupation:   retired  Review of Systems Genitourinary, constitutional, skin, eye, otolaryngeal, hematologic/lymphatic, cardiovascular, pulmonary, endocrine, musculoskeletal, gastrointestinal, neurological and psychiatric system(s) were reviewed and pertinent findings if present are noted.  Genitourinary: urinary frequency, feelings of urinary urgency, nocturia, hematuria and erectile dysfunction, but no dysuria and urine stream is not weak.  Gastrointestinal: no nausea, no vomiting, no flank pain and no abdominal pain.  Hematologic/Lymphatic: a tendency to easily bruise.  Cardiovascular: no chest pain.  Musculoskeletal: no bone pain and no back pain.    Vitals Vital Signs [Data Includes: Last 1 Day]  Recorded: [id number] 10:58AM  Blood Pressure: 179 / 100 Temperature: 98.2 F Heart Rate: 63  Physical Exam Constitutional: Well nourished and well developed . No acute distress.  Pulmonary: No respiratory distress and normal respiratory rhythm and effort.  Cardiovascular: Heart rate and rhythm are normal . No peripheral edema.  Abdomen: The abdomen is soft and nontender. No masses are palpated. No CVA tenderness. No hernias are palpable.  No hepatosplenomegaly noted.  Skin: Normal skin turgor, no visible rash and no visible skin lesions.  Neuro/Psych:. Mood and affect are appropriate.    Results/Data Urine [Data Includes: Last 1 Day]   [id number]  COLOR RED   APPEARANCE CLOUDY   SQUAMOUS EPITHELIAL/HPF NONE SEEN   WBC 0-2 WBC/hpf  RBC TNTC RBC/hpf  BACTERIA MODERATE   CRYSTALS NONE SEEN   CASTS NONE SEEN    Assessment Assessed  1. 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 Gross  hematuria (599.71)  Plan Gross hematuria  1. Follow-up Schedule Surgery Office  Follow-up  Status: Hold For - Appointment   Requested for: [id number] Health Maintenance  2. UA With REFLEX; [Do Not Release]; Status:Resulted - Requires Verification;   Done:  [id number] 10:43AM  Discussion/Summary   Mort has actually done surprisingly well over the last several years, given his prior history as an aggressive and poorly differentiated muscle invasive cancer. In addition, he was diagnosed with a tumor within his left renal pelvis, which was treated endoscopically. He has now had several weeks of recurrent gross hematuria and it is exceedingly likely that this represents recurrent transitional cell carcinoma. Whether it is in the bladder or upper tracts or both, is unclear at this point. He really does not want to have office cystoscopy and it would be limited, given his ongoing hematuria. For that reason, we will bring him to surgery for cystoscopy under anesthesia. If there is an abnormality in the his bladder, this will be biopsied and/or resected, depending on what is found. We will also perform bilateral retrograde pyelography and if necessary consider ureteroscopy. Depending on where any findings are located, we may be able to deal with it definitively or we may be able to simply provide diagnostic information. We potentially will need to keep him overnight depending on what is found and what has to be done. We will assess renal function and hemoglobin levels today to see where we stand. We will attempt to get this on schedule for the next 1-3 weeks presuming hemoglobin and blood work is acceptable and does not require anymore urgent intervention.  Signatures Electronically signed by : Rana Snare, M.D.; Oct 31 2013  4:25PM EST

## 2013-11-17 ENCOUNTER — Encounter (HOSPITAL_COMMUNITY): Payer: Self-pay | Admitting: *Deleted

## 2013-11-17 ENCOUNTER — Ambulatory Visit (HOSPITAL_COMMUNITY)
Admission: RE | Admit: 2013-11-17 | Discharge: 2013-11-17 | Disposition: A | Discharge status: Home / Self Care | Payer: Medicare Other | Source: Ambulatory Visit | Attending: Urology | Admitting: Urology

## 2013-11-17 ENCOUNTER — Encounter (HOSPITAL_COMMUNITY): Payer: Medicare Other | Admitting: Anesthesiology

## 2013-11-17 ENCOUNTER — Encounter (HOSPITAL_COMMUNITY)
Admission: RE | Disposition: A | Discharge status: Home / Self Care | Payer: Self-pay | Source: Ambulatory Visit | Attending: Urology

## 2013-11-17 ENCOUNTER — Ambulatory Visit (HOSPITAL_COMMUNITY): Payer: Medicare Other | Admitting: Anesthesiology

## 2013-11-17 DIAGNOSIS — Z7982 Long term (current) use of aspirin: Secondary | ICD-10-CM | POA: Insufficient documentation

## 2013-11-17 DIAGNOSIS — I10 Essential (primary) hypertension: Secondary | ICD-10-CM | POA: Insufficient documentation

## 2013-11-17 DIAGNOSIS — N1330 Unspecified hydronephrosis: Secondary | ICD-10-CM | POA: Insufficient documentation

## 2013-11-17 DIAGNOSIS — H409 Unspecified glaucoma: Secondary | ICD-10-CM | POA: Insufficient documentation

## 2013-11-17 DIAGNOSIS — Z8551 Personal history of malignant neoplasm of bladder: Secondary | ICD-10-CM | POA: Insufficient documentation

## 2013-11-17 DIAGNOSIS — E7800 Pure hypercholesterolemia, unspecified: Secondary | ICD-10-CM | POA: Insufficient documentation

## 2013-11-17 DIAGNOSIS — R310 Gross hematuria: Secondary | ICD-10-CM

## 2013-11-17 DIAGNOSIS — C679 Malignant neoplasm of bladder, unspecified: Secondary | ICD-10-CM

## 2013-11-17 DIAGNOSIS — Z79899 Other long term (current) drug therapy: Secondary | ICD-10-CM | POA: Insufficient documentation

## 2013-11-17 DIAGNOSIS — R12 Heartburn: Secondary | ICD-10-CM | POA: Insufficient documentation

## 2013-11-17 DIAGNOSIS — D414 Neoplasm of uncertain behavior of bladder: Secondary | ICD-10-CM | POA: Insufficient documentation

## 2013-11-17 HISTORY — PX: CYSTOSCOPY/RETROGRADE/URETEROSCOPY: SHX5316

## 2013-11-17 SURGERY — CYSTOSCOPY/RETROGRADE/URETEROSCOPY
Anesthesia: General

## 2013-11-17 MED ORDER — LIDOCAINE HCL 2 % EX GEL
CUTANEOUS | Status: DC | PRN
  Administered 2013-11-17: 1 via URETHRAL

## 2013-11-17 MED ORDER — PROMETHAZINE HCL 25 MG PO TABS
25.0000 mg | ORAL_TABLET | Freq: Once | ORAL | Status: AC
  Administered 2013-11-17: 25 mg via ORAL
  Filled 2013-11-17 (×3): qty 1

## 2013-11-17 MED ORDER — LIDOCAINE HCL 2 % EX GEL
CUTANEOUS | Status: AC
  Filled 2013-11-17: qty 10

## 2013-11-17 MED ORDER — CIPROFLOXACIN IN D5W 400 MG/200ML IV SOLN
400.0000 mg | INTRAVENOUS | Status: AC
  Administered 2013-11-17: 400 mg via INTRAVENOUS

## 2013-11-17 MED ORDER — ONDANSETRON HCL 4 MG/2ML IJ SOLN
INTRAMUSCULAR | Status: DC | PRN
  Administered 2013-11-17: 4 mg via INTRAVENOUS

## 2013-11-17 MED ORDER — PROPOFOL 10 MG/ML IV BOLUS
INTRAVENOUS | Status: DC | PRN
  Administered 2013-11-17: 50 mg via INTRAVENOUS
  Administered 2013-11-17: 150 mg via INTRAVENOUS

## 2013-11-17 MED ORDER — EPHEDRINE SULFATE 50 MG/ML IJ SOLN
INTRAMUSCULAR | Status: DC | PRN
  Administered 2013-11-17: 5 mg via INTRAVENOUS

## 2013-11-17 MED ORDER — FENTANYL CITRATE 0.05 MG/ML IJ SOLN
INTRAMUSCULAR | Status: AC
  Filled 2013-11-17: qty 2

## 2013-11-17 MED ORDER — SODIUM CHLORIDE 0.9 % IJ SOLN
INTRAMUSCULAR | Status: AC
  Filled 2013-11-17: qty 10

## 2013-11-17 MED ORDER — LACTATED RINGERS IV SOLN: INTRAVENOUS | Status: DC

## 2013-11-17 MED ORDER — DEXAMETHASONE SODIUM PHOSPHATE 10 MG/ML IJ SOLN
INTRAMUSCULAR | Status: DC | PRN
  Administered 2013-11-17: 10 mg via INTRAVENOUS

## 2013-11-17 MED ORDER — DEXAMETHASONE SODIUM PHOSPHATE 10 MG/ML IJ SOLN
INTRAMUSCULAR | Status: AC
  Filled 2013-11-17: qty 1

## 2013-11-17 MED ORDER — EPHEDRINE SULFATE 50 MG/ML IJ SOLN
INTRAMUSCULAR | Status: AC
  Filled 2013-11-17: qty 1

## 2013-11-17 MED ORDER — LACTATED RINGERS IV SOLN
INTRAVENOUS | Status: DC | PRN
  Administered 2013-11-17: 09:00:00 via INTRAVENOUS

## 2013-11-17 MED ORDER — CIPROFLOXACIN IN D5W 400 MG/200ML IV SOLN
INTRAVENOUS | Status: AC
  Filled 2013-11-17: qty 200

## 2013-11-17 MED ORDER — ONDANSETRON HCL 4 MG/2ML IJ SOLN
INTRAMUSCULAR | Status: AC
  Filled 2013-11-17: qty 2

## 2013-11-17 MED ORDER — FENTANYL CITRATE 0.05 MG/ML IJ SOLN
INTRAMUSCULAR | Status: DC | PRN
  Administered 2013-11-17 (×4): 25 ug via INTRAVENOUS

## 2013-11-17 MED ORDER — PROPOFOL 10 MG/ML IV BOLUS
INTRAVENOUS | Status: AC
  Filled 2013-11-17: qty 20

## 2013-11-17 MED ORDER — STERILE WATER FOR IRRIGATION IR SOLN
Status: DC | PRN
  Administered 2013-11-17: 3000 mL

## 2013-11-17 MED ORDER — FENTANYL CITRATE 0.05 MG/ML IJ SOLN
25.0000 ug | INTRAMUSCULAR | Status: DC | PRN
  Administered 2013-11-17 (×2): 25 ug via INTRAVENOUS
  Administered 2013-11-17: 50 ug via INTRAVENOUS

## 2013-11-17 MED ORDER — SODIUM CHLORIDE 0.9 % IR SOLN
Status: DC | PRN
  Administered 2013-11-17: 4000 mL

## 2013-11-17 SURGICAL SUPPLY — 27 items
BAG URINE DRAINAGE (UROLOGICAL SUPPLIES) IMPLANT
BAG URO CATCHER STRL LF (DRAPE) ×3 IMPLANT
BASKET ZERO TIP NITINOL 2.4FR (BASKET) IMPLANT
BLADE SURG 15 STRL LF DISP TIS (BLADE) IMPLANT
BLADE SURG 15 STRL SS (BLADE)
BSKT STON RTRVL ZERO TP 2.4FR (BASKET)
CATH INTERMIT  6FR 70CM (CATHETERS) ×3 IMPLANT
CATH URET 5FR 28IN CONE TIP (BALLOONS)
CATH URET 5FR 70CM CONE TIP (BALLOONS) IMPLANT
DRAPE CAMERA CLOSED 9X96 (DRAPES) ×3 IMPLANT
ELECT REM PT RETURN 9FT ADLT (ELECTROSURGICAL) ×3
ELECTRODE REM PT RTRN 9FT ADLT (ELECTROSURGICAL) ×2 IMPLANT
GLOVE BIOGEL M STRL SZ7.5 (GLOVE) ×18 IMPLANT
GOWN STRL REUS W/TWL LRG LVL3 (GOWN DISPOSABLE) ×9 IMPLANT
GOWN STRL REUS W/TWL XL LVL3 (GOWN DISPOSABLE) IMPLANT
GUIDEWIRE STR DUAL SENSOR (WIRE) ×6 IMPLANT
HOLDER FOLEY CATH W/STRAP (MISCELLANEOUS) IMPLANT
KIT ASPIRATION TUBING (SET/KITS/TRAYS/PACK) IMPLANT
LOOPS RESECTOSCOPE DISP (ELECTROSURGICAL) IMPLANT
MANIFOLD NEPTUNE II (INSTRUMENTS) ×3 IMPLANT
NS IRRIG 1000ML POUR BTL (IV SOLUTION) ×3 IMPLANT
PACK CYSTO (CUSTOM PROCEDURE TRAY) ×3 IMPLANT
SHEATH ACCESS URETERAL 54CM (SHEATH) ×3 IMPLANT
STENT CONTOUR 7FRX24 (STENTS) ×3 IMPLANT
SYRINGE IRR TOOMEY STRL 70CC (SYRINGE) ×3 IMPLANT
TUBING CONNECTING 10 (TUBING) ×3 IMPLANT
WIRE COONS/BENSON .038X145CM (WIRE) IMPLANT

## 2013-11-17 NOTE — Anesthesia Postprocedure Evaluation (Signed)
  Anesthesia Post-op Note  Patient: [name]  Procedure(s) Performed: Procedure(s) (LRB): CYSTOSCOPY, bilateral /RETROGRADE/ right URETEROSCOPY, right stent, bladder fulgeration (Bilateral)  Patient Location: PACU  Anesthesia Type: General  Level of Consciousness: awake and alert   Airway and Oxygen Therapy: Patient Spontanous Breathing  Post-op Pain: mild  Post-op Assessment: Post-op Vital signs reviewed, Patient's Cardiovascular Status Stable, Respiratory Function Stable, Patent Airway and No signs of Nausea or vomiting  Last Vitals:  Filed Vitals:   11/17/13 1230  BP: 152/60  Pulse: 58  Temp: 36.6 C  Resp: 14    Post-op Vital Signs: stable   Complications: No apparent anesthesia complications

## 2013-11-17 NOTE — Interval H&P Note (Signed)
History and Physical Interval Note:  11/17/2013 10:14 AM  [name]  has presented today for surgery, with the diagnosis of GROSS HEMATURIA, HISTORY OF BLADDER AND URETERAL CANCER  The various methods of treatment have been discussed with the patient and family. After consideration of risks, benefits and other options for treatment, the patient has consented to  Procedure(s): CYSTOSCOPY/RETROGRADE/ POSSIBLE URETEROSCOPY (Bilateral) TRANSURETHRAL RESECTION OF BLADDER TUMOR (TURBT) (N/A) as a surgical intervention .  The patient's history has been reviewed, patient examined, no change in status, stable for surgery.  I have reviewed the patient's chart and labs.  Questions were answered to the patient's satisfaction.     Cyanna Neace S

## 2013-11-17 NOTE — Anesthesia Preprocedure Evaluation (Addendum)
Anesthesia Evaluation  Patient identified by MRN, date of birth, ID band Patient awake    Reviewed: Allergy & Precautions, H&P , NPO status , Patient's Chart, lab work & pertinent test results  History of Anesthesia Complications (+) PONV and history of anesthetic complications  Airway Mallampati: II TM Distance: >3 FB Neck ROM: Full    Dental no notable dental hx.    Pulmonary neg pulmonary ROS,  breath sounds clear to auscultation  Pulmonary exam normal       Cardiovascular Exercise Tolerance: Good hypertension, Pt. on medications + dysrhythmias Rhythm:Regular Rate:Normal  CXR and ECG reviewed.   Neuro/Psych negative neurological ROS  negative psych ROS   GI/Hepatic negative GI ROS, Neg liver ROS,   Endo/Other  negative endocrine ROS  Renal/GU negative Renal ROS  negative genitourinary   Musculoskeletal negative musculoskeletal ROS (+)   Abdominal   Peds negative pediatric ROS (+)  Hematology negative hematology ROS (+)   Anesthesia Other Findings   Reproductive/Obstetrics negative OB ROS                          Anesthesia Physical Anesthesia Plan  ASA: III  Anesthesia Plan: General   Post-op Pain Management:    Induction: Intravenous  Airway Management Planned: LMA  Additional Equipment:   Intra-op Plan:   Post-operative Plan: Extubation in OR  Informed Consent: I have reviewed the patients History and Physical, chart, labs and discussed the procedure including the risks, benefits and alternatives for the proposed anesthesia with the patient or authorized representative who has indicated his/her understanding and acceptance.   Dental advisory given  Plan Discussed with: CRNA  Anesthesia Plan Comments: (Multiple antiemetics.)       Anesthesia Quick Evaluation

## 2013-11-17 NOTE — Preoperative (Signed)
Beta Blockers   Reason not to administer Beta Blockers:Not Applicable 

## 2013-11-17 NOTE — Transfer of Care (Signed)
Immediate Anesthesia Transfer of Care Note  Patient: [name]  Procedure(s) Performed: Procedure(s): CYSTOSCOPY, bilateral /RETROGRADE/ right URETEROSCOPY, right stent, bladder fulgeration (Bilateral)  Patient Location: PACU  Anesthesia Type:General  Level of Consciousness: awake and patient cooperative  Airway & Oxygen Therapy: Patient Spontanous Breathing and Patient connected to face mask oxygen  Post-op Assessment: Report given to PACU RN and Post -op Vital signs reviewed and stable  Post vital signs: Reviewed and stable  Complications: No apparent anesthesia complications

## 2013-11-17 NOTE — Progress Notes (Signed)
Denies nausea  Wants to go home

## 2013-11-17 NOTE — Op Note (Signed)
Preoperative diagnosis: history of transitional cell carcinoma of the urinary bladder and left renal pelvis, gross hematuria Postoperative diagnosis:same  Procedure:cystoscopy, bilateral retrograde pyelography with fluoroscopic interpretation, right flexible ureteroscopy and bladder fulguration   Surgeon: Bernestine Amass M.D.  Anesthesia: Gen.  Indications:patient is currently 80 years of age.  He was diagnosed with muscle invasive bladder cancer or possibly 4-5 years ago.  He refused cystectomy and was treated with radiation and chemotherapy.  The patient subsequently had a tumor involving the left renal pelvis/proximal ureter which was treated endoscopically.  The patient then did well and had no evidence of recurrence.  He was last imaged proximally 12 months ago.  The patient subsequently refused ongoing surveillance cystoscopy recently developed significant ongoing gross hematuria.  He presents now for further assessment of the hematuria.     Technique and findings:patient was brought to the operating room revealed successful induction of general anesthesia.  He was placed in lithotomy position appropriate usual manner.  Appropriate surgical timeout was performed.  Cystoscopy revealed a unremarkable anterior and prostatic urethra.  After irrigation the bladder did show evidence of a small area of papillary recurrence above the left trigone.  Was not actively bleeding however.  The left ureteral orifice was capacious.   Retrograde pyelogram  was performed on the left side.  The ureter appeared delicate and there were no obvious filling defects. Because of his prior history in that location and placed an open-ended catheter in the left renal pelvis were saline washings were performed for cytology.  The urine from that side appeared to be clear.  On evaluation of the right ureteral orifice gross blood could be seen effluxing from the orifice.  A retrograde polyp wrap showed a delicate appearing ureter  but a question of several filling defects in the right renal pelvis.  Both retrograde followed grams were performed with fluoroscopic interpretation.  A guidewire was placed to the right renal pelvis and a access sheath was placed.  The digital flexible ureteroscope was then inserted.  One could appreciate diffuse oozing from the right collecting system but despite careful endoscopic assessment of each calyx I was unable to find a definitive area of bleeding and saw no definitive evidence of a transitional cell carcinoma.  A right-sided double-J stent was placed.  This was 7 [place] 24 cm.  With the resectoscope in place and utilizing a loop I fulgurated the small area of papillary tumor recurrence above the left hemitrigone.  This was a small area of carpeting of tumor that appeared to be superficial.  Given his entire circumstance I did not feel that pathology would likely change my management.  The patient was brought to recovery room in stable condition having had no obvious complications or problems.      I saw [facility] 6:30

## 2013-11-17 NOTE — Discharge Instructions (Signed)
[facility] [phone] Post Ureteroscopy With or Without Stent Instructions  Definitions:  Ureter: The duct that transports urine from the kidney to the bladder. Stent:   A plastic hollow tube that is placed into the ureter, from the kidney to the                 bladder to prevent the ureter from swelling shut.  GENERAL INSTRUCTIONS:  Despite the fact that no skin incisions were used, the area around the ureter and bladder is raw and irritated. The stent is a foreign body which will further irritate the bladder wall. This irritation is manifested by increased frequency of urination, both day and night, and by an increase in the urge to urinate. In some, the urge to urinate is present almost always. Sometimes the urge is strong enough that you may not be able to stop yourself from urinating. The only real cure is to remove the stent and then give time for the bladder wall to heal which can't be done until the danger of the ureter swelling shut has passed, which varies.  You may see some blood in your urine while the stent is in place and a few days afterwards. Do not be alarmed, even if the urine was clear for a while. Get off your feet and drink lots of fluids until clearing occurs. If you start to pass clots or don't improve, call us.  DIET: You may return to your normal diet immediately. Because of the raw surface of your bladder, alcohol, spicy foods, acid type foods and drinks with caffeine may cause irritation or frequency and should be used in moderation. To keep your urine flowing freely and to avoid constipation, drink plenty of fluids during the day ( 8-10 glasses ). Tip: Avoid cranberry juice because it is very acidic.  ACTIVITY: Your physical activity doesn't need to be restricted. However, if you are very active, you may see some blood in your urine. We suggest that you reduce your activity under these circumstances until the bleeding has stopped.  BOWELS: It is  important to keep your bowels regular during the postoperative period. Straining with bowel movements can cause bleeding. A bowel movement every other day is reasonable. Use a mild laxative if needed, such as Milk of Magnesia 2-3 tablespoons, or 2 Dulcolax tablets. Call if you continue to have problems. If you have been taking narcotics for pain, before, during or after your surgery, you may be constipated. Take a laxative if necessary.   MEDICATION: You should resume your pre-surgery medications unless told not to. In addition you will often be given an antibiotic to prevent infection. These should be taken as prescribed until the bottles are finished unless you are having an unusual reaction to one of the drugs.  PROBLEMS YOU SHOULD REPORT TO [place]:  Fevers over 100.5 Fahrenheit.  Heavy bleeding, or clots ( See above notes about blood in urine ).  Inability to urinate.  Drug reactions ( hives, rash, nausea, vomiting, diarrhea ).  Severe burning or pain with urination that is not improving.  FOLLOW-UP: You will need a follow-up appointment to monitor your progress. Call for this appointment at the number listed above. Usually the first appointment will be about three to fourteen days after your surgery.  Hold the aspirin and do not restart

## 2013-11-18 ENCOUNTER — Encounter (HOSPITAL_COMMUNITY): Payer: Self-pay | Admitting: Urology

## 2013-12-18 ENCOUNTER — Emergency Department: Payer: Self-pay | Admitting: Emergency Medicine

## 2013-12-25 ENCOUNTER — Other Ambulatory Visit: Payer: Self-pay | Admitting: Urology

## 2013-12-29 ENCOUNTER — Ambulatory Visit: Payer: Medicare Other | Admitting: Cardiology

## 2013-12-31 ENCOUNTER — Encounter (HOSPITAL_COMMUNITY): Payer: Self-pay | Admitting: *Deleted

## 2013-12-31 NOTE — Progress Notes (Signed)
Completed preop instructions along with allergies, medications, and history by phone with daughter, [place].  Verbalized understanding.  Patient uses Scient[employer], water quality in [place].

## 2014-01-06 ENCOUNTER — Encounter: Payer: Self-pay | Admitting: Cardiology

## 2014-01-08 NOTE — H&P (Signed)
Reason For Visit   Marta presents today for a followup visit. Again, his very complicated history is summarized in the section below. Because of ongoing gross hematuria, he was taken to surgery at the end of January for clinical reassessment. At that time, I could not appreciate any definitive tumor in his bladder. He did have clear evidence of blood effluxing from the right ureteral orifice. We went ahead and performed ureteroscopy. I was never able to appreciate any definitive malignancy within the right collecting system. Because of the need for dilation, we placed a double-J stent, which remains indwelling. Interestingly, he has tolerated that stent well and has not had recurrent gross hematuria. The patient was felt to have a little area of some papillary tumor above the left hemitrigone which was just fulgurated. 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 That retrograde showed nothing of concern but when I did do some washings, there were some malignant cells. I suspect he had some low-level, low-volume tumor up in that left upper tract, but again, that side did not appear to be bleeding . clinically, he is doing well. He would prefer to be put to sleep for any stent removal.     History of Present Illness       PAST GU HX:      Mr. Reath is currently 80 years of age. 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 He is also noted to have bilateral hydronephrosis. He did have tumor involving he left hemi-trigone which did encroach on the right side of the bladder as well. Bilateral double-J stents were placed. On second look biopsy, there was no obvious residual tumor. The patient was treated with chemo and radiation therapy. He finished his radiation in July of 2010. CT scan done in September of 2010 was negative for hydronephrosis, negative for metastatic disease or  obvious bladder tumor. He was taken to surgery for cystoscopy which was really totally unremarkable. Random biopsy was negative at that time.        The patient was reassessed endoscopically ( 2011) to both check his bladder as well as a questionable filling defect in his proximal left ureter. At the time of surgery his bladder looked pretty good but there were some areas of erythema. I did some biopsies which did show some low-grade papillary transitional cell carcinoma without any evidence of invasion. The patient also had ureteroscopy. There I could see a papillary tumor in his proximal left ureter. Biopsies indeed confirmed that there was a papillary tumor. We did the best we could with fulguration and the gross tumor appeared to be taken care of with that procedure, although certainly we could not definitively say that the tumor was completely eradicated. Brendt has recovered from that well. He tells me today he does not want any other procedures or operations and certainly I think he is not a particularly good candidate for cystectomy or nephroureterectomy. He lives alone and certainly does not want to end up in a nursing facility. After discussion today, however, I do think we need to strike a balance between ignoring this problem and seeing what we can do on a continued endoscopic basis to keep things under control and most importantly keep his quality of life as high as possible and keep him from having further problems or issues.     Cytology checked in February 2013 was  negative. CT scan with IV contrast showed some proximal ureteral narrowing but no evidence of obvious obstruction or tumor at that time. That was also done in February 2013. The patient again did get BCG weekly x6 weeks completed in January 2012.    Patient underwent repeat CT imaging of the abdomen and pelvis with contrast in February 2014. There is no evidence of significant obstruction/hydronephrosis and the bladder was  without evidence of obvious mass/recurrence. There is no evidence of metastatic disease. The patient refused cystoscopy at that time.     Past Medical History Problems  1. History of glaucoma (V12.49) 2. History of heartburn (V12.79) 3. History of hypercholesterolemia (V12.29) 4. History of hypertension (V12.59)  Surgical History Problems  1. History of Cystoscopy With Biopsy 2. History of Cystoscopy With Biopsy 3. History of Cystoscopy With Fulguration Large Lesion (Over 5cm) 4. History of Cystoscopy With Fulguration Medium Lesion (2-5cm) 5. History of Cystoscopy With Fulguration Minor Lesion (Under 76mm) 6. History of Cystoscopy With Insertion Of Ureteral Stent Bilateral 7. History of Cystoscopy With Insertion Of Ureteral Stent Left 8. History of Cystoscopy With Insertion Of Ureteral Stent Left 9. History of Cystoscopy With Insertion Of Ureteral Stent Right 10. History of Cystoscopy With Insertion Of Ureteral Stent Right 11. History of Cystoscopy With Ureteroscopy For Biopsy Left 12. History of Cystoscopy With Ureteroscopy Left 13. History of Cystoscopy With Ureteroscopy Right 14. History of Tonsillectomy  Current Meds 1. Aspirin 81 MG Oral Tablet; 2 tablets 1 x daily;  Therapy: (Recorded:27Jul2012) to Recorded 2. Atorvastatin Calcium 10 MG Oral Tablet;  Therapy: [id number] to Recorded 3. Avodart 0.5 MG Oral Capsule; TAKE 1 CAPSULE BY MOUTH  EVERY DAY;  Therapy: 30Sep2008 to (Evaluate:24Feb2015)  Requested for: [phone]; Last  Rx:26Nov2014 Ordered 4. Azopt 1 % Ophthalmic Suspension; 1 drop in the left eye twice daily;  Therapy: [id number] to Recorded 5. Dorzolamide HCl - 2 % Ophthalmic Solution;  Therapy: [phone] to Recorded 6. Latanoprost 0.005 % Ophthalmic Solution;  Therapy: 28Apr2011 to Recorded 7. Lisinopril 10 MG Oral Tablet; 1 per day;  Therapy: (Recorded:27Jul2012) to Recorded 8. PriLOSEC OTC TBEC; 20mg ; 1 per day;  Therapy: (Recorded:27Jul2012) to Recorded 9.  VESIcare 5 MG Oral Tablet; Take 1 tablet daily;  Therapy: 20Apr2012 to (Evaluate:20Apr2013)  Requested for: 20Apr2012; Last  Rx:20Apr2012 Ordered  Allergies Medication  1. No Known Drug Allergies  Family History Problems  1. Family history of Acute Myocardial Infarction (V17.3) : Father 2. Family history of Breast Cancer (V16.3) : Mother 3. Family history of Death In The Family Father   6yrs, MI 4. Family history of Death In The Family Mother   41yrs, breast cancer 5. Denied: Family history of Family Health Status Number Of Children 6. Family history of Hypertension (V17.49) : Father  Social History Problems  1. Denied: History of Alcohol Use 2. Denied: History of Caffeine Use 3. Marital History - Single 4. Never A Smoker 5. Occupation:   retired  Review of Systems Genitourinary, constitutional, skin, eye, otolaryngeal, hematologic/lymphatic, cardiovascular, pulmonary, endocrine, musculoskeletal, gastrointestinal, neurological and psychiatric system(s) were reviewed and pertinent findings if present are noted.  Genitourinary: urinary frequency, feelings of urinary urgency, nocturia and erectile dysfunction, but no dysuria, urine stream is not weak and no hematuria.  Gastrointestinal: no nausea, no vomiting, no flank pain and no abdominal pain.  Hematologic/Lymphatic: a tendency to easily bruise.  Cardiovascular: no chest pain.  Musculoskeletal: no bone pain and no back pain.    Vitals Vital Signs [Data Includes: Last 1  Day]  Recorded: [id number] 11:09AM  Blood Pressure: 131 / 84 Temperature: 97.5 F Heart Rate: 47  Well-developed well-nourished male in no acute distress Respiratory: Normal effort  cardiac: Regular rate and rhythm Abdomen: No obvious masses, nontender, soft no CVA tenderness Extremities: No edema tenderness   Results/Data Urine [Data Includes: Last 1 Day]   [id number] COLOR RED  APPEARANCE CLOUDY  SPECIFIC GRAVITY 1.020  pH 5.5  GLUCOSE NEG  mg/dL BILIRUBIN NEG  KETONE NEG mg/dL BLOOD LARGE  PROTEIN 100 mg/dL UROBILINOGEN 0.2 mg/dL NITRITE NEG  LEUKOCYTE ESTERASE TRACE  SQUAMOUS EPITHELIAL/HPF FEW  WBC 21-50 WBC/hpf RBC TNTC RBC/hpf BACTERIA MANY  CRYSTALS NONE SEEN  CASTS NONE SEEN   Assessment Assessed  1. 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 Gross hematuria (599.71)  Plan Gross hematuria  1. URINE CULTURE; Status:Hold For - Specimen/Data Collection,Appointment; Requested  for:02Mar2015;  2. URINE CYTOLOGY; Status:Hold For - Specimen/Data Collection,Appointment;  Requested for:02Mar2015;  3. Follow-up Schedule Surgery Office  Follow-up  Status: Hold For - Appointment   Requested for: [id number] Health Maintenance  4. UA With REFLEX; [Do Not Release]; Status:Complete;   Done: [id number] 11:02AM  Discussion/Summary   Wai is doing okay clinically. He has a history of muscle invasive bladder cancer and also a history of transitional cell carcinoma of the upper tracts. He is not interested nor a great candidate for any aggressive treatment. We are really trying to do our best to make sure he does not develop any clinical problems and try to keep his quality of life as high as possible. He does not want his double-J stent removed in the office. For that reason, we will plan on an outpatient procedure in 3-4 weeks. We will plan on removal of his stent and just a quick reassessment of his bladder. We may perform some additional retrograde pyelograms, just to be sure that we missed nothing last month. We will then plan on just monitoring his situation. Urine culture to be done today.   Signatures Electronically signed by : Rana Snare, M.D.; Dec 22 2013  2:24PM EST

## 2014-01-09 ENCOUNTER — Encounter (HOSPITAL_COMMUNITY): Payer: Self-pay | Admitting: *Deleted

## 2014-01-09 NOTE — Progress Notes (Signed)
Requested last office visit note from podiatry office of Dr Caryl Comes in [place] ( had boot on as of 3/11/5) .  They will fax.  Left message with daughter, [place] to see if any medications changed since last phone conversation of 12/31/13.

## 2014-01-12 ENCOUNTER — Encounter (HOSPITAL_COMMUNITY): Payer: Self-pay | Admitting: *Deleted

## 2014-01-12 ENCOUNTER — Encounter (HOSPITAL_COMMUNITY): Payer: Medicare Other | Admitting: Registered Nurse

## 2014-01-12 ENCOUNTER — Encounter (HOSPITAL_COMMUNITY)
Admission: RE | Disposition: A | Discharge status: Home / Self Care | Payer: Self-pay | Source: Ambulatory Visit | Attending: Urology

## 2014-01-12 ENCOUNTER — Ambulatory Visit (HOSPITAL_COMMUNITY)
Admission: RE | Admit: 2014-01-12 | Discharge: 2014-01-12 | Disposition: A | Discharge status: Home / Self Care | Payer: Medicare Other | Source: Ambulatory Visit | Attending: Urology | Admitting: Urology

## 2014-01-12 ENCOUNTER — Ambulatory Visit (HOSPITAL_COMMUNITY): Payer: Medicare Other | Admitting: Registered Nurse

## 2014-01-12 DIAGNOSIS — Z79899 Other long term (current) drug therapy: Secondary | ICD-10-CM | POA: Insufficient documentation

## 2014-01-12 DIAGNOSIS — N3289 Other specified disorders of bladder: Secondary | ICD-10-CM | POA: Insufficient documentation

## 2014-01-12 DIAGNOSIS — Z7982 Long term (current) use of aspirin: Secondary | ICD-10-CM | POA: Insufficient documentation

## 2014-01-12 DIAGNOSIS — H409 Unspecified glaucoma: Secondary | ICD-10-CM | POA: Insufficient documentation

## 2014-01-12 DIAGNOSIS — I10 Essential (primary) hypertension: Secondary | ICD-10-CM | POA: Insufficient documentation

## 2014-01-12 DIAGNOSIS — E7800 Pure hypercholesterolemia, unspecified: Secondary | ICD-10-CM | POA: Insufficient documentation

## 2014-01-12 DIAGNOSIS — D4120 Neoplasm of uncertain behavior of unspecified ureter: Secondary | ICD-10-CM

## 2014-01-12 DIAGNOSIS — D4100 Neoplasm of uncertain behavior of unspecified kidney: Secondary | ICD-10-CM | POA: Insufficient documentation

## 2014-01-12 DIAGNOSIS — C679 Malignant neoplasm of bladder, unspecified: Secondary | ICD-10-CM | POA: Insufficient documentation

## 2014-01-12 HISTORY — DX: Unspecified fracture of right talus, initial encounter for closed fracture: S92.101A

## 2014-01-12 HISTORY — PX: CYSTOSCOPY WITH RETROGRADE PYELOGRAM, URETEROSCOPY AND STENT PLACEMENT: SHX5789

## 2014-01-12 LAB — BASIC METABOLIC PANEL
BUN: 14 mg/dL (ref 6–23)
CO2: 24 mEq/L (ref 19–32)
Calcium: 8.6 mg/dL (ref 8.4–10.5)
Chloride: 106 mEq/L (ref 96–112)
Creatinine, Ser: 1.1 mg/dL (ref 0.50–1.35)
GFR, EST AFRICAN AMERICAN: 67 mL/min — AB (ref >90)
GFR, EST NON AFRICAN AMERICAN: 58 mL/min — AB (ref >90)
Glucose, Bld: 106 mg/dL — ABNORMAL HIGH (ref 70–99)
POTASSIUM: 3.7 meq/L (ref 3.7–5.3)
Sodium: 142 mEq/L (ref 137–147)

## 2014-01-12 LAB — CBC
HCT: 37.5 % — ABNORMAL LOW (ref 39.0–52.0)
Hemoglobin: 12.7 g/dL — ABNORMAL LOW (ref 13.0–17.0)
MCH: 30.7 pg (ref 26.0–34.0)
MCHC: 33.9 g/dL (ref 30.0–36.0)
MCV: 90.6 fL (ref 78.0–100.0)
PLATELETS: 140 10*3/uL — AB (ref 150–400)
RBC: 4.14 MIL/uL — ABNORMAL LOW (ref 4.22–5.81)
RDW: 13.2 % (ref 11.5–15.5)
WBC: 5.7 10*3/uL (ref 4.0–10.5)

## 2014-01-12 SURGERY — CYSTOURETEROSCOPY, WITH RETROGRADE PYELOGRAM AND STENT INSERTION
Anesthesia: General | Site: Ureter | Laterality: Bilateral

## 2014-01-12 MED ORDER — ONDANSETRON HCL 4 MG/2ML IJ SOLN
INTRAMUSCULAR | Status: AC
  Filled 2014-01-12: qty 2

## 2014-01-12 MED ORDER — ONDANSETRON HCL 4 MG/2ML IJ SOLN
INTRAMUSCULAR | Status: DC | PRN
  Administered 2014-01-12: 4 mg via INTRAVENOUS

## 2014-01-12 MED ORDER — LACTATED RINGERS IV SOLN: INTRAVENOUS | Status: DC

## 2014-01-12 MED ORDER — LIDOCAINE HCL (CARDIAC) 20 MG/ML IV SOLN
INTRAVENOUS | Status: AC
  Filled 2014-01-12: qty 5

## 2014-01-12 MED ORDER — PROMETHAZINE HCL 25 MG/ML IJ SOLN: 6.2500 mg | INTRAMUSCULAR | Status: DC | PRN

## 2014-01-12 MED ORDER — FENTANYL CITRATE 0.05 MG/ML IJ SOLN
INTRAMUSCULAR | Status: AC
  Filled 2014-01-12: qty 2

## 2014-01-12 MED ORDER — LACTATED RINGERS IV SOLN
INTRAVENOUS | Status: DC
  Administered 2014-01-12: 1000 mL via INTRAVENOUS

## 2014-01-12 MED ORDER — IOHEXOL 300 MG/ML  SOLN
INTRAMUSCULAR | Status: DC | PRN
  Administered 2014-01-12: 20 mL

## 2014-01-12 MED ORDER — CIPROFLOXACIN IN D5W 400 MG/200ML IV SOLN
INTRAVENOUS | Status: AC
  Filled 2014-01-12: qty 200

## 2014-01-12 MED ORDER — STERILE WATER FOR IRRIGATION IR SOLN
Status: DC | PRN
  Administered 2014-01-12: 3000 mL

## 2014-01-12 MED ORDER — LIDOCAINE HCL (CARDIAC) 20 MG/ML IV SOLN
INTRAVENOUS | Status: DC | PRN
Start: 2014-01-12 — End: 2014-01-12
  Administered 2014-01-12: 50 mg via INTRAVENOUS

## 2014-01-12 MED ORDER — FENTANYL CITRATE 0.05 MG/ML IJ SOLN: 25.0000 ug | INTRAMUSCULAR | Status: DC | PRN

## 2014-01-12 MED ORDER — SODIUM CHLORIDE 0.9 % IR SOLN
Status: DC | PRN
  Administered 2014-01-12: 1000 mL

## 2014-01-12 MED ORDER — PROPOFOL 10 MG/ML IV BOLUS
INTRAVENOUS | Status: AC
  Filled 2014-01-12: qty 20

## 2014-01-12 MED ORDER — CIPROFLOXACIN IN D5W 400 MG/200ML IV SOLN
400.0000 mg | INTRAVENOUS | Status: AC
  Administered 2014-01-12: 400 mg via INTRAVENOUS

## 2014-01-12 MED ORDER — LIDOCAINE HCL 2 % EX GEL
CUTANEOUS | Status: AC
  Filled 2014-01-12: qty 10

## 2014-01-12 MED ORDER — FENTANYL CITRATE 0.05 MG/ML IJ SOLN
INTRAMUSCULAR | Status: DC | PRN
  Administered 2014-01-12 (×3): 25 ug via INTRAVENOUS

## 2014-01-12 MED ORDER — PROPOFOL 10 MG/ML IV BOLUS
INTRAVENOUS | Status: DC | PRN
  Administered 2014-01-12: 100 mg via INTRAVENOUS
  Administered 2014-01-12: 50 mg via INTRAVENOUS

## 2014-01-12 MED ORDER — LIDOCAINE HCL 2 % EX GEL
CUTANEOUS | Status: DC | PRN
  Administered 2014-01-12: 1 via URETHRAL

## 2014-01-12 SURGICAL SUPPLY — 23 items
BAG URINE DRAINAGE (UROLOGICAL SUPPLIES) IMPLANT
BASKET ZERO TIP NITINOL 2.4FR (BASKET) IMPLANT
BSKT STON RTRVL ZERO TP 2.4FR (BASKET)
CATH INTERMIT  6FR 70CM (CATHETERS) IMPLANT
CLOTH BEACON ORANGE TIMEOUT ST (SAFETY) ×3 IMPLANT
DRAPE CAMERA CLOSED 9X96 (DRAPES) IMPLANT
ELECT REM PT RETURN 9FT ADLT (ELECTROSURGICAL) ×3
ELECTRODE REM PT RTRN 9FT ADLT (ELECTROSURGICAL) ×1 IMPLANT
FIBER LASER FLEXIVA 200 (UROLOGICAL SUPPLIES) IMPLANT
FIBER LASER FLEXIVA 365 (UROLOGICAL SUPPLIES) IMPLANT
GLOVE BIO SURGEON STRL SZ7 (GLOVE) ×3 IMPLANT
GLOVE BIOGEL M STRL SZ7.5 (GLOVE) ×3 IMPLANT
GLOVE SURG SS PI 7.5 STRL IVOR (GLOVE) ×3 IMPLANT
GOWN STRL REUS W/ TWL LRG LVL3 (GOWN DISPOSABLE) ×1 IMPLANT
GOWN STRL REUS W/TWL LRG LVL3 (GOWN DISPOSABLE) ×2
GOWN STRL REUS W/TWL XL LVL3 (GOWN DISPOSABLE) ×3 IMPLANT
GUIDEWIRE .038 (WIRE) IMPLANT
GUIDEWIRE ANG ZIPWIRE 038X150 (WIRE) IMPLANT
GUIDEWIRE STR DUAL SENSOR (WIRE) ×3 IMPLANT
IV NS IRRIG 3000ML ARTHROMATIC (IV SOLUTION) IMPLANT
NDL SAFETY ECLIPSE 18X1.5 (NEEDLE) ×1 IMPLANT
NEEDLE HYPO 18GX1.5 SHARP (NEEDLE) ×2
PACK CYSTO (CUSTOM PROCEDURE TRAY) ×3 IMPLANT

## 2014-01-12 NOTE — Transfer of Care (Signed)
Immediate Anesthesia Transfer of Care Note  Patient: [name]  Procedure(s) Performed: Procedure(s): CYSTOSCOPY WITH BILATERAL RETROGRADE PYELOGRAM/URETERAL STENT REMOVAL, URETEROSCOPY, LEFT  AND POSSIBLE BLADDER BIOPSY (Bilateral)  Patient Location: PACU  Anesthesia Type:General  Level of Consciousness: awake, alert , oriented and patient cooperative  Airway & Oxygen Therapy: Patient Spontanous Breathing and Patient connected to face mask oxygen  Post-op Assessment: Report given to PACU RN, Post -op Vital signs reviewed and stable and Patient moving all extremities  Post vital signs: Reviewed and stable  Complications: No apparent anesthesia complications

## 2014-01-12 NOTE — Anesthesia Preprocedure Evaluation (Addendum)
Anesthesia Evaluation  Patient identified by MRN, date of birth, ID band Patient awake    Reviewed: Allergy & Precautions, H&P , NPO status , Patient's Chart, lab work & pertinent test results  History of Anesthesia Complications (+) PONV and history of anesthetic complications  Airway Mallampati: II TM Distance: >3 FB Neck ROM: Full    Dental no notable dental hx. (+) Teeth Intact, Poor Dentition,    Pulmonary neg pulmonary ROS,  breath sounds clear to auscultation  Pulmonary exam normal       Cardiovascular Exercise Tolerance: Good hypertension, Pt. on medications + dysrhythmias Rhythm:Regular Rate:Normal  CXR and ECG reviewed.   Neuro/Psych negative neurological ROS  negative psych ROS   GI/Hepatic negative GI ROS, Neg liver ROS,   Endo/Other  negative endocrine ROS  Renal/GU negative Renal ROS  negative genitourinary   Musculoskeletal negative musculoskeletal ROS (+)   Abdominal   Peds  Hematology negative hematology ROS (+)   Anesthesia Other Findings   Reproductive/Obstetrics                         Anesthesia Physical Anesthesia Plan  ASA: III  Anesthesia Plan: General   Post-op Pain Management:    Induction: Intravenous  Airway Management Planned: LMA  Additional Equipment:   Intra-op Plan:   Post-operative Plan:   Informed Consent: I have reviewed the patients History and Physical, chart, labs and discussed the procedure including the risks, benefits and alternatives for the proposed anesthesia with the patient or authorized representative who has indicated his/her understanding and acceptance.   Dental advisory given  Plan Discussed with: CRNA  Anesthesia Plan Comments:         Anesthesia Quick Evaluation

## 2014-01-12 NOTE — Op Note (Signed)
Preoperative diagnosis: History of muscle invasive transitional cell carcinoma urinary bladder, history of bilateral ureteral transitional cell carcinoma Postoperative diagnosis: Same  Procedure: Cystoscopy, removal of right double-J stent, right ureteroscopy, bladder biopsy with fulguration   Surgeon: Bernestine Amass M.D.  Anesthesia: Gen.  Indications: 80 year old male with long-standing history of transitional cell carcinoma. He has had muscle invasive bladder cancer St. with radiation and chemotherapy. He is also been known to have tumor in the upper tract of his left kidney. He has refused more aggressive therapy and therefore we do what we can endoscopically. The patient recently had gross hematuria with blood coming from his right ureteral orifice. Double-J stent was placed after a diagnostic ureteroscopy recently. He presents now for removal of his double-J stent and reexamination of his distal right ureter. He is also to have reassessment of his bladder.     Technique and findings: Patient was brought the operative room where he had successful induction general anesthesia. He was placed in lithotomy position and prepped and draped in usual manner. PAS compression boots were used and he received perioperative antibiotics. An appropriate surgical timeout was performed. Cystoscopy revealed a right double-J stent to be in good position. Careful inspection of the bladder did show some slight thickening the left lateral wall the bladder. There were some mucosal changes but again he has had radiation.   Right double-J stent was partially removed and a guidewire placed. Ureteroscopy was then performed of the distal right ureter. There appeared to be some carpeting of papillary tumor in the distal 4-5 cm of the right ureter. Given the carpeting of the tumor was not felt to be amenable to any type of easy endoscopic treatment. The left bladder wall was cold cup biopsied and then fulgurated. Patient was  brought to recovery room in stable condition having had no obvious complications or problems.

## 2014-01-12 NOTE — Discharge Instructions (Signed)

## 2014-01-12 NOTE — Interval H&P Note (Signed)
History and Physical Interval Note:  01/12/2014 9:54 AM  [name]  has presented today for surgery, with the diagnosis of GROSS HEMATURIA, HISTORY OF BLADDER CANCER  The various methods of treatment have been discussed with the patient and family. After consideration of risks, benefits and other options for treatment, the patient has consented to  Procedure(s): CYSTOSCOPY WITH RETROGRADE PYELOGRAM/URETERAL STENT REMOVAL AND POSSIBLE BLADDER BIOPSY (N/A) as a surgical intervention .  The patient's history has been reviewed, patient examined, no change in status, stable for surgery.  I have reviewed the patient's chart and labs.  Questions were answered to the patient's satisfaction.     [name]

## 2014-01-12 NOTE — Anesthesia Postprocedure Evaluation (Signed)
Anesthesia Post Note  Patient: [name]  Procedure(s) Performed: Procedure(s) (LRB): CYSTOSCOPY WITH BILATERAL RETROGRADE PYELOGRAM/URETERAL STENT REMOVAL, URETEROSCOPY, LEFT  AND POSSIBLE BLADDER BIOPSY (Bilateral)  Anesthesia type: General  Patient location: PACU  Post pain: Pain level controlled  Post assessment: Post-op Vital signs reviewed  Last Vitals:  Filed Vitals:   01/12/14 1306  BP: 140/66  Pulse: 66  Temp: 36.6 C  Resp: 14    Post vital signs: Reviewed  Level of consciousness: sedated  Complications: No apparent anesthesia complications

## 2014-01-12 NOTE — Interval H&P Note (Signed)
History and Physical Interval Note:  01/12/2014 9:58 AM  [name]  has presented today for surgery, with the diagnosis of GROSS HEMATURIA, HISTORY OF BLADDER CANCER  The various methods of treatment have been discussed with the patient and family. After consideration of risks, benefits and other options for treatment, the patient has consented to  Procedure(s): CYSTOSCOPY WITH RETROGRADE PYELOGRAM/URETERAL STENT REMOVAL AND POSSIBLE BLADDER BIOPSY (N/A) as a surgical intervention .  The patient's history has been reviewed, patient examined, no change in status, stable for surgery.  I have reviewed the patient's chart and labs.  Questions were answered to the patient's satisfaction.     Mirage Pfefferkorn S

## 2014-01-12 NOTE — Preoperative (Signed)
Beta Blockers   Reason not to administer Beta Blockers:Not Applicable 

## 2014-01-13 ENCOUNTER — Encounter (HOSPITAL_COMMUNITY): Payer: Self-pay | Admitting: Urology

## 2014-01-16 ENCOUNTER — Other Ambulatory Visit: Payer: Self-pay

## 2014-01-16 MED ORDER — LISINOPRIL 10 MG PO TABS: 10.0000 mg | ORAL_TABLET | Freq: Two times a day (BID) | ORAL | Status: DC

## 2014-04-22 ENCOUNTER — Encounter: Payer: Self-pay | Admitting: Cardiology

## 2014-05-01 ENCOUNTER — Other Ambulatory Visit: Payer: Self-pay | Admitting: *Deleted

## 2014-05-01 MED ORDER — ATORVASTATIN CALCIUM 10 MG PO TABS: 10.0000 mg | ORAL_TABLET | Freq: Every morning | ORAL | Status: DC

## 2014-05-22 ENCOUNTER — Other Ambulatory Visit: Payer: Self-pay

## 2014-05-22 MED ORDER — LISINOPRIL 10 MG PO TABS: 10.0000 mg | ORAL_TABLET | Freq: Two times a day (BID) | ORAL | Status: DC

## 2014-06-23 ENCOUNTER — Other Ambulatory Visit: Payer: Self-pay

## 2014-06-23 MED ORDER — LISINOPRIL 10 MG PO TABS: 10.0000 mg | ORAL_TABLET | Freq: Two times a day (BID) | ORAL | Status: DC

## 2014-07-28 ENCOUNTER — Other Ambulatory Visit: Payer: Self-pay

## 2014-07-28 MED ORDER — LISINOPRIL 10 MG PO TABS: 10.0000 mg | ORAL_TABLET | Freq: Two times a day (BID) | ORAL | Status: DC

## 2014-08-26 ENCOUNTER — Other Ambulatory Visit: Payer: Self-pay

## 2014-08-26 MED ORDER — LISINOPRIL 10 MG PO TABS: 10.0000 mg | ORAL_TABLET | Freq: Two times a day (BID) | ORAL | Status: DC

## 2014-09-21 ENCOUNTER — Ambulatory Visit: Payer: Medicare Other | Admitting: Cardiology

## 2014-10-13 ENCOUNTER — Encounter: Payer: Self-pay | Admitting: Cardiology

## 2014-10-23 ENCOUNTER — Other Ambulatory Visit: Payer: Self-pay | Admitting: Cardiology

## 2014-10-28 ENCOUNTER — Other Ambulatory Visit: Payer: Self-pay

## 2014-11-05 ENCOUNTER — Other Ambulatory Visit: Payer: Self-pay | Admitting: *Deleted

## 2014-11-05 MED ORDER — LISINOPRIL 10 MG PO TABS: 10.0000 mg | ORAL_TABLET | Freq: Two times a day (BID) | ORAL | Status: DC

## 2014-11-27 ENCOUNTER — Encounter: Payer: Self-pay | Admitting: Cardiology

## 2014-11-27 ENCOUNTER — Ambulatory Visit (INDEPENDENT_AMBULATORY_CARE_PROVIDER_SITE_OTHER): Payer: Medicare Other | Admitting: Cardiology

## 2014-11-27 VITALS — BP 126/80 | HR 61 | Wt 180.0 lb

## 2014-11-27 DIAGNOSIS — I493 Ventricular premature depolarization: Secondary | ICD-10-CM

## 2014-11-27 DIAGNOSIS — E785 Hyperlipidemia, unspecified: Secondary | ICD-10-CM

## 2014-11-27 NOTE — Progress Notes (Signed)
Cardiology Office Note   Date:  11/27/2014   ID:  [name], DOB 1923/03/23, MRN [id number]  PCP:  No PCP Per Patient  Cardiologist:   Darlin Coco, MD   No chief complaint on file.     History of Present Illness: [name] is a 81 y.o. male who presents for a six-month follow-up office visit.  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 He has a history of dyslipidemia and a history of frequent PVCs. He does not have any history of ischemic heart disease. He had a normal nuclear stress test in 2005. His EKGs in the past have shown rate related right bundle branch block pattern.  Since last visit he has been feeling well. He has not been expressing any chest pain or shortness of breath. No syncope. No racing of his heart. His appetite is good but his weight is down 5 pound. He eats his meals out every day. He does not cook at home. He does not get any aerobic exercise other than for walking to his mailbox and back.  Since we last saw him he had a cystoscopy by his urologist.  The patient reports no recent hematuria or dysuria and he has nocturia only once a night.  Past Medical History  Diagnosis Date  . Arrhythmia     pvcs  . Hyperlipidemia   . Cancer     bladder  . BPH (benign prostatic hyperplasia)   . Hypertension   . PONV (postoperative nausea and vomiting)   . Glaucoma   . Fracture of right talus     diagonosed 12/22/13- last f/u note on chart dated 01/02/14.     Past Surgical History  Procedure Laterality Date  . Bladder surgery  4 or 5 years ago    "cut bladder cancer out"  . Tonsillectomy and adenoidectomy  as child  . Cystoscopy/retrograde/ureteroscopy Bilateral 11/17/2013    Procedure: CYSTOSCOPY, bilateral /RETROGRADE/ right URETEROSCOPY, right stent, bladder fulgeration;  Surgeon: Bernestine Amass, MD;  Location: [facility];  Service: Urology;  Laterality: Bilateral;  . Cystoscopy with retrograde  pyelogram, ureteroscopy and stent placement Bilateral 01/12/2014    Procedure: CYSTOSCOPY WITH BILATERAL RETROGRADE PYELOGRAM/URETERAL STENT REMOVAL, URETEROSCOPY, LEFT  AND  BLADDER BIOPSY;  Surgeon: Bernestine Amass, MD;  Location: [facility];  Service: Urology;  Laterality: Bilateral;     Current Outpatient Prescriptions  Medication Sig Dispense Refill  . brinzolamide (AZOPT) 1 % ophthalmic suspension Place 1 drop into both eyes 2 (two) times daily.    [name] dutasteride (AVODART) 0.5 MG capsule Take 0.5 mg by mouth daily.     [name] latanoprost (XALATAN) 0.005 % ophthalmic solution Place 1 drop into both eyes at bedtime.    [name] lisinopril (PRINIVIL,ZESTRIL) 10 MG tablet Take 1 tablet (10 mg total) by mouth 2 (two) times daily. 60 tablet 0   No current facility-administered medications for this visit.    Allergies:   Review of patient's allergies indicates no known allergies.    Social History:  The patient  reports that he has never smoked. He has never used smokeless tobacco. He reports that he does not drink alcohol or use illicit drugs.   Family History:  The patient's family history includes Breast cancer (age of onset: 37) in his mother; Cancer in his mother; Heart attack (age of onset: 8) in his father; Hypertension in his father; Stroke in his father.    ROS:  Please  see the history of present illness.   Otherwise, review of systems are positive for none.   All other systems are reviewed and negative.    PHYSICAL EXAM: VS:  BP 126/80 mmHg  Pulse 61  Wt 180 lb (81.647 kg) , BMI Body mass index is 28.19 kg/(m^2). GEN: Well nourished, well developed, in no acute distress HEENT: normal Neck: no JVD, carotid bruits, or masses Cardiac: RRR; no murmurs, rubs, or gallops,no edema  Respiratory:  clear to auscultation bilaterally, normal work of breathing GI: soft, nontender, nondistended, + BS MS: no deformity or atrophy Skin: warm and dry, no rash Neuro:  Strength and sensation are  intact Psych: euthymic mood, full affect   EKG:  EKG is ordered today. The ekg ordered today demonstrates normal sinus rhythm.  There is an incomplete right bundle branch block.  Since previous tracing of 11/11/13, no significant change.   Recent Labs: 01/12/2014: BUN 14; Creatinine 1.10; Hemoglobin 12.7*; Platelets 140*; Potassium 3.7; Sodium 142    Lipid Panel    Component Value Date/Time   CHOL 117 06/27/2013 1528   TRIG 365.0* 06/27/2013 1528   HDL 27.90* 06/27/2013 1528   CHOLHDL 4 06/27/2013 1528   VLDL 73.0* 06/27/2013 1528   LDLCALC 54 06/09/2011 0812   LDLDIRECT 54.1 06/27/2013 1528      Wt Readings from Last 3 Encounters:  11/27/14 180 lb (81.647 kg)  01/12/14 184 lb 6 oz (83.632 kg)  06/27/13 185 lb (83.915 kg)      Other studies Reviewed:    ASSESSMENT AND PLAN:  1.  Essential hypertension without heart failure 2.  Incomplete right bundle branch block 3.  Hypercholesterolemia 4.  Strain of bladder cancer 5.  Past history of PVCs, not seen on EKG today.   Current medicines are reviewed at length with the patient today.  The patient does not have concerns regarding medicines.  The following changes have been made:  no change  Labs/ tests ordered today include:   Orders Placed This Encounter  Procedures  . Lipid panel  . Hepatic function panel  . Basic metabolic panel  . EKG 12-Lead     Disposition:   FU with Dr. Mare Ferrari in 6 months for office visit fasting lipid panel hepatic function panel and basal metabolic panel.  At the present time he is apparently not taking his Lipitor.  He is a little bit confused as to what medicines he is and is not taking.   Signed, Darlin Coco, MD  11/27/2014 1:59 PM    [facility] [address] [place], [place], [place]  [address] Phone: [phone]; Fax: [phone]

## 2014-11-27 NOTE — Patient Instructions (Signed)
Your physician recommends that you continue on your current medications as directed. Please refer to the Current Medication list given to you today.  Your physician wants you to follow-up in: 6 months with fasting labs (lp/bmet/hfp)  You will receive a reminder letter in the mail two months in advance. If you don't receive a letter, please call our office to schedule the follow-up appointment.  

## 2014-12-23 ENCOUNTER — Other Ambulatory Visit: Payer: Self-pay | Admitting: Cardiology

## 2015-02-17 DIAGNOSIS — H401190 Primary open-angle glaucoma, unspecified eye, stage unspecified: Secondary | ICD-10-CM | POA: Insufficient documentation

## 2015-06-22 ENCOUNTER — Encounter: Payer: Self-pay | Admitting: Cardiology

## 2015-07-28 ENCOUNTER — Ambulatory Visit (INDEPENDENT_AMBULATORY_CARE_PROVIDER_SITE_OTHER): Payer: Medicare Other | Admitting: Cardiology

## 2015-07-28 ENCOUNTER — Encounter: Payer: Self-pay | Admitting: Cardiology

## 2015-07-28 VITALS — BP 160/90 | HR 68 | Ht 68.0 in | Wt 169.8 lb

## 2015-07-28 DIAGNOSIS — I10 Essential (primary) hypertension: Secondary | ICD-10-CM | POA: Diagnosis not present

## 2015-07-28 DIAGNOSIS — E785 Hyperlipidemia, unspecified: Secondary | ICD-10-CM | POA: Diagnosis not present

## 2015-07-28 DIAGNOSIS — I493 Ventricular premature depolarization: Secondary | ICD-10-CM | POA: Diagnosis not present

## 2015-07-28 MED ORDER — LISINOPRIL 20 MG PO TABS: 20.0000 mg | ORAL_TABLET | Freq: Every day | ORAL | Status: DC

## 2015-07-28 NOTE — Progress Notes (Signed)
Cardiology Office Note   Date:  07/28/2015   ID:  [name], DOB November 21, 1922, MRN [id number]  PCP:  No PCP Per Patient  Cardiologist: Darlin Coco MD  No chief complaint on file.     History of Present Illness: [name] is a 81 y.o. 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 He has a history of dyslipidemia and a history of frequent PVCs. He does not have any history of ischemic heart disease. He had a normal nuclear stress test in 2005. His EKGs in the past have shown rate related right bundle branch block pattern.  Since last visit he has been feeling well. He has not been expressing any chest pain or shortness of breath. No syncope. No racing of his heart. His appetite is good but his weight is down 11 pounds. He eats his meals out frequently. He does not cook at home. He does not get any aerobic exercise other than for walking to his mailbox and back. He is being followed by urology for bladder cancer.  At his urologist's office last week his blood pressure was running high at 177/99.  His blood pressure today is also high.  Some of this may be dietary indiscretion from eating out.  We talked to him about the importance of low-salt diet.   Past Medical History  Diagnosis Date  . Arrhythmia     pvcs  . Hyperlipidemia   . Cancer ([place])     bladder  . BPH (benign prostatic hyperplasia)   . Hypertension   . PONV (postoperative nausea and vomiting)   . Glaucoma   . Fracture of right talus     diagonosed 12/22/13- last f/u note on chart dated 01/02/14.     Past Surgical History  Procedure Laterality Date  . Bladder surgery  4 or 5 years ago    "cut bladder cancer out"  . Tonsillectomy and adenoidectomy  as child  . Cystoscopy/retrograde/ureteroscopy Bilateral 11/17/2013    Procedure: CYSTOSCOPY, bilateral /RETROGRADE/ right URETEROSCOPY, right stent,  bladder fulgeration;  Surgeon: Bernestine Amass, MD;  Location: [facility];  Service: Urology;  Laterality: Bilateral;  . Cystoscopy with retrograde pyelogram, ureteroscopy and stent placement Bilateral 01/12/2014    Procedure: CYSTOSCOPY WITH BILATERAL RETROGRADE PYELOGRAM/URETERAL STENT REMOVAL, URETEROSCOPY, LEFT  AND  BLADDER BIOPSY;  Surgeon: Bernestine Amass, MD;  Location: [facility];  Service: Urology;  Laterality: Bilateral;     Current Outpatient Prescriptions  Medication Sig Dispense Refill  . brinzolamide (AZOPT) 1 % ophthalmic suspension Place 1 drop into both eyes 2 (two) times daily.    [name] dutasteride (AVODART) 0.5 MG capsule Take 0.5 mg by mouth daily.     [name] latanoprost (XALATAN) 0.005 % ophthalmic solution Place 1 drop into both eyes at bedtime.    [name] lisinopril (PRINIVIL,ZESTRIL) 20 MG tablet Take 1 tablet (20 mg total) by mouth daily. 90 tablet 3   No current facility-administered medications for this visit.    Allergies:   Review of patient's allergies indicates no known allergies.    Social History:  The patient  reports that he has never smoked. He has never used smokeless tobacco. He reports that he does not drink alcohol or use illicit drugs.   Family History:  The patient's family history includes Breast cancer (age of onset: 76) in his mother; Cancer in his mother; Heart attack (age of  onset: 64) in his father; Hypertension in his father; Stroke in his father.    ROS:  Please see the history of present illness.   Otherwise, review of systems are positive for none.   All other systems are reviewed and negative.    PHYSICAL EXAM: VS:  BP 160/90 mmHg  Pulse 68  Ht 5\' 8"  (1.727 m)  Wt 169 lb 12.8 oz (77.021 kg)  BMI 25.82 kg/m2 , BMI Body mass index is 25.82 kg/(m^2). GEN: Well nourished, well developed, in no acute distress HEENT: normal Neck: no JVD, carotid bruits, or masses Cardiac: RRR; no murmurs, rubs, or gallops,no edema  Respiratory:  clear to auscultation  bilaterally, normal work of breathing GI: soft, nontender, nondistended, + BS MS: no deformity or atrophy Skin: warm and dry, no rash Neuro:  Strength and sensation are intact Psych: euthymic mood, full affect   EKG:  EKG is not ordered today.    Recent Labs: No results found for requested labs within last 365 days.    Lipid Panel    Component Value Date/Time   CHOL 117 06/27/2013 1528   TRIG 365.0* 06/27/2013 1528   HDL 27.90* 06/27/2013 1528   CHOLHDL 4 06/27/2013 1528   VLDL 73.0* 06/27/2013 1528   LDLCALC 54 06/09/2011 0812   LDLDIRECT 54.1 06/27/2013 1528      Wt Readings from Last 3 Encounters:  07/28/15 169 lb 12.8 oz (77.021 kg)  11/27/14 180 lb (81.647 kg)  01/12/14 184 lb 6 oz (83.632 kg)         ASSESSMENT AND PLAN:  1. Essential hypertension without heart failure 2. Incomplete right bundle branch block 3. Hypercholesterolemia 4. History  of bladder cancer--Dr. Risa Grill 5. Past history of PVCs,    Current medicines are reviewed at length with the patient today.  The patient does not have concerns regarding medicines.  The following changes have been made:  Increase dose of lisinopril to 20 mg daily  Labs/ tests ordered today include:  No orders of the defined types were placed in this encounter.     Disposition:  Continue IV medicines the same.  He will be getting a primary care physician in [place].  His stepdaughter will help him do that.  His stepdaughter also tries to bring him healthy food for him to warm up in his microwave at home  Return here when necessary  Signed, Darlin Coco MD 07/28/2015 1:29 PM    [address], [place], [place]  [address] Phone: [phone]; Fax: [phone]

## 2015-07-28 NOTE — Patient Instructions (Signed)
Medication Instructions:  INCREASE YOUR LISINOPRIL TO 20 MG DAILY   Labwork: NONE  Testing/Procedures: NONE  Follow-Up: AS NEEDED

## 2015-08-27 ENCOUNTER — Ambulatory Visit (INDEPENDENT_AMBULATORY_CARE_PROVIDER_SITE_OTHER): Payer: Medicare Other | Admitting: Family Medicine

## 2015-08-27 ENCOUNTER — Telehealth: Payer: Self-pay | Admitting: Family Medicine

## 2015-08-27 ENCOUNTER — Encounter: Payer: Self-pay | Admitting: Family Medicine

## 2015-08-27 VITALS — BP 142/84 | HR 58 | Temp 99.2°F | Ht 68.0 in | Wt 171.4 lb

## 2015-08-27 DIAGNOSIS — F0390 Unspecified dementia without behavioral disturbance: Secondary | ICD-10-CM | POA: Insufficient documentation

## 2015-08-27 DIAGNOSIS — I10 Essential (primary) hypertension: Secondary | ICD-10-CM

## 2015-08-27 DIAGNOSIS — R413 Other amnesia: Secondary | ICD-10-CM

## 2015-08-27 DIAGNOSIS — E538 Deficiency of other specified B group vitamins: Secondary | ICD-10-CM

## 2015-08-27 DIAGNOSIS — N179 Acute kidney failure, unspecified: Secondary | ICD-10-CM

## 2015-08-27 LAB — COMPREHENSIVE METABOLIC PANEL
ALK PHOS: 61 U/L (ref 39–117)
ALT: 7 U/L (ref 0–53)
AST: 10 U/L (ref 0–37)
Albumin: 4.1 g/dL (ref 3.5–5.2)
BILIRUBIN TOTAL: 0.6 mg/dL (ref 0.2–1.2)
BUN: 17 mg/dL (ref 6–23)
CALCIUM: 8.6 mg/dL (ref 8.4–10.5)
CO2: 27 meq/L (ref 19–32)
CREATININE: 1.55 mg/dL — AB (ref 0.40–1.50)
Chloride: 106 mEq/L (ref 96–112)
GFR: 44.95 mL/min — AB (ref >60.00)
Glucose, Bld: 86 mg/dL (ref 70–99)
Potassium: 3.9 mEq/L (ref 3.5–5.1)
Sodium: 141 mEq/L (ref 135–145)
TOTAL PROTEIN: 6.5 g/dL (ref 6.0–8.3)

## 2015-08-27 LAB — TSH: TSH: 1.96 u[IU]/mL (ref 0.35–4.50)

## 2015-08-27 LAB — VITAMIN B12: Vitamin B-12: 158 pg/mL — ABNORMAL LOW (ref 211–911)

## 2015-08-27 NOTE — Telephone Encounter (Signed)
Attempted to call patient. There was no answer and there is no voicemail set up. I called and spoke to the patient's stepdaughter Opal Sidles who was with him at the office visit today and patient signed paperwork stating that we could verbally disclose any information to her. I advised her that the patient's B12 was low and that this could be a cause of his memory issues. He would need to come in for B12 injections. I will have my nurse on Monday call to set this up for the patient. I also advised that the TSH was normal. We discussed the patient's creatinine. This is elevated from last check, though has not been checked since March 2015. He did have a recent increase in his dose of lisinopril. It is difficult to say whether or not this is directly related to the lisinopril or if this is just chronic kidney progression given it has been so long since his last bmet check. Though given that the patient's creatinine went from 1.1 to 1.55 I think it would be prudent to decrease his dose of lisinopril back to 10 mg daily. Patient's daughter will break the 20 mg tablets in half to give a 10 mg dose. Patient's daughter will pick up a blood pressure cuff and check the patient's blood pressure at home this weekend. If it is above 150/90 they will call the office and speak to the nurse triage service. Will have patient come in on Monday for repeat lab work to ensure improvement in creatinine.

## 2015-08-27 NOTE — Assessment & Plan Note (Signed)
Patient will long history of progressive memory decline. His MOCA scoring does not lineup with his ability to care for himself at home and manages finances and shopping himself. Suspect hearing issues contributed to low score, though suspect there is some level of dementia. We will obtain lab work including C met, TSH, B12, and RPR to evaluate for additional causes. I did discuss with the patient and his daughter that I would advise him not to drive given that he has gotten lost. They reported that this would be difficult. We will obtain a formal driving assessment through the [facility].

## 2015-08-27 NOTE — Patient Instructions (Signed)
Nice to meet you. We will check lab work to look for causes of your memory issues.  Please do not drive.  If you have worsening memory issues or issues getting lost please seek medical attention.

## 2015-08-27 NOTE — Assessment & Plan Note (Signed)
Is at goal today. We will continue lisinopril 20 mg daily. We'll check renal function and electrolytes as well.

## 2015-08-27 NOTE — Progress Notes (Signed)
Patient ID: [name], male   DOB: [birth year], 81 y.o.   MRN: [id number]  Nathaniel Rumps, MD Phone: [phone]  [name] is a 81 y.o. male who presents today for new patient visit.  Memory issues: Patient's daughter notes the patient has had issues with his memory off and on for years that has gotten worse since May. His issues involve remembering what he had for breakfast, who he has talked to this past week and what they talked about, and issues finding his car in a parking lot. Patient's daughter notes he still drives himself. He reports he has not gotten lost driving himself, though he has on several occasions been unable to find his car at the [place] parking lot. He is able to bathe and dress himself. He does not cook for himself. He is still able to shop for himself and manage his finances. He does administer his own medications, though his daughter lays them out on a weekly basis for him. She does note she has to call and remind him to do his eyedrops nightly. She notes he is not eating the right stuff, stating that he eats high sodium foods that are canned and frozen. MOCA score of 10-difficult to know accurate this is given patient's difficulty hearing  HYPERTENSION Disease Monitoring Home BP Monitoring not checking Chest pain- no    Dyspnea- no Medications Compliance-  taking lisinopril 20 mg daily, this was increased from 10 mg recently. Lightheadedness-  no  Edema- no   Active Ambulatory Problems    Diagnosis Date Noted  . Dyslipidemia 06/09/2011  . PVC (premature ventricular contraction) 06/09/2011  . Bladder cancer ([place]) 11/17/2013  . Gross hematuria 11/17/2013  . Essential hypertension 08/27/2015  . Memory deficits 08/27/2015   Resolved Ambulatory Problems    Diagnosis Date Noted  . No Resolved Ambulatory Problems   Past Medical History  Diagnosis Date  . Arrhythmia   . Hyperlipidemia   . Cancer ([place])   . BPH (benign prostatic hyperplasia)   . Hypertension    . PONV (postoperative nausea and vomiting)   . Glaucoma   . Fracture of right talus     Family History  Problem Relation Age of Onset  . Heart attack Father 44    deceased from mi  . Hypertension Father   . Stroke Father   . Breast cancer Mother 42    breast cancer  . Cancer Mother     Social History   Social History  . Marital Status: Widowed    Spouse Name: N/A  . Number of Children: N/A  . Years of Education: N/A   Occupational History  . Not on file.   Social History Main Topics  . Smoking status: Never Smoker   . Smokeless tobacco: Never Used  . Alcohol Use: No     Comment: rare  . Drug Use: No  . Sexual Activity: Not on file   Other Topics Concern  . Not on file   Social History Narrative    ROS   General:  Negative for unexplained weight loss, fever Skin: Negative for new or changing mole, sore that won't heal HEENT: Negative for trouble hearing, trouble seeing, ringing in ears, mouth sores, hoarseness, change in voice, dysphagia. CV:  Negative for chest pain, dyspnea, edema, palpitations Resp: Negative for cough, dyspnea, hemoptysis GI: Negative for nausea, vomiting, diarrhea, constipation, abdominal pain, melena, hematochezia. GU: Negative for dysuria, incontinence, urinary hesitance, hematuria, vaginal or penile discharge, polyuria, sexual difficulty,  lumps in testicle or breasts MSK: Negative for muscle cramps or aches, joint pain or swelling Neuro: Negative for headaches, weakness, numbness, dizziness, passing out/fainting Psych: Negative for depression, anxiety, positive for memory problems  Objective  Physical Exam Filed Vitals:   08/27/15 1327  BP: 142/84  Pulse: 58  Temp: 99.2 F (37.3 C)    Physical Exam  Constitutional: He is well-developed, well-nourished, and in no distress.  Patient is extremely difficult of hearing  HENT:  Head: Normocephalic and atraumatic.  Right Ear: External ear normal.  Left Ear: External ear normal.   Mouth/Throat: Oropharynx is clear and moist. No oropharyngeal exudate.  Hearing aids in bilateral ears  Eyes: Conjunctivae are normal. Pupils are equal, round, and reactive to light.  Neck: Neck supple.  Cardiovascular: Normal rate, regular rhythm and normal heart sounds.  Exam reveals no gallop and no friction rub.   No murmur heard. Pulmonary/Chest: Effort normal and breath sounds normal. No respiratory distress. He has no wheezes. He has no rales.  Abdominal: Soft. Bowel sounds are normal. He exhibits no distension. There is no tenderness. There is no rebound and no guarding.  Musculoskeletal: He exhibits no edema.  Lymphadenopathy:    He has no cervical adenopathy.  Neurological: He is alert. Gait normal.  Skin: Skin is warm and dry. He is not diaphoretic.  Psychiatric: Mood and affect normal.     Assessment/Plan:   Essential hypertension Is at goal today. We will continue lisinopril 20 mg daily. We'll check renal function and electrolytes as well.  Memory deficits Patient will long history of progressive memory decline. His MOCA scoring does not lineup with his ability to care for himself at home and manages finances and shopping himself. Suspect hearing issues contributed to low score, though suspect there is some level of dementia. We will obtain lab work including C met, TSH, B12, and RPR to evaluate for additional causes. I did discuss with the patient and his daughter that I would advise him not to drive given that he has gotten lost. They reported that this would be difficult. We will obtain a formal driving assessment through the [facility].    Orders Placed This Encounter  Procedures  . RPR  . B12  . TSH  . Comp Met (CMET)   [name]

## 2015-08-27 NOTE — Progress Notes (Signed)
Pre visit review using our clinic review tool, if applicable. No additional management support is needed unless otherwise documented below in the visit note. 

## 2015-08-28 LAB — RPR

## 2015-08-30 NOTE — Telephone Encounter (Signed)
Spoke with patient and he stated that he did not want to come back to see Dr. Caryl Bis. I tried to call the patients daughter in law to speak with her due to her being with patient during the appointment and patient is hard of hearing.

## 2015-08-30 NOTE — Telephone Encounter (Signed)
Attempted to call patient's daughter. No answer. Left a voicemail asking her to call back to the office.

## 2015-08-31 NOTE — Telephone Encounter (Signed)
Spoke to patient daughter and she is going to bring her father in for B 12 injection and BP check on Friday

## 2015-08-31 NOTE — Telephone Encounter (Signed)
Called and spoke with patient's daughter. She notes she's had discussions with her dad regarding coming in to the doctor. She'll continue to have discussions with him regarding this. We did discuss scheduling an appointment for repeat lab work. she reports that she can bring him in this on Friday. Lab appointment was scheduled for Friday at 2 PM. Future orders were placed. She is going to start checking the patient's blood pressure. I will also have my nurse give her a call to see if she can set up a nurse visit for blood pressure check and B12 injection on Friday as well.

## 2015-09-03 ENCOUNTER — Ambulatory Visit: Payer: Medicare Other | Admitting: Cardiology

## 2015-09-03 ENCOUNTER — Ambulatory Visit (INDEPENDENT_AMBULATORY_CARE_PROVIDER_SITE_OTHER): Payer: Medicare Other

## 2015-09-03 ENCOUNTER — Other Ambulatory Visit (INDEPENDENT_AMBULATORY_CARE_PROVIDER_SITE_OTHER): Payer: Medicare Other

## 2015-09-03 ENCOUNTER — Other Ambulatory Visit: Payer: Medicare Other

## 2015-09-03 VITALS — BP 128/82 | HR 69 | Resp 18

## 2015-09-03 DIAGNOSIS — E538 Deficiency of other specified B group vitamins: Secondary | ICD-10-CM | POA: Diagnosis not present

## 2015-09-03 DIAGNOSIS — N179 Acute kidney failure, unspecified: Secondary | ICD-10-CM

## 2015-09-03 DIAGNOSIS — I10 Essential (primary) hypertension: Secondary | ICD-10-CM | POA: Diagnosis not present

## 2015-09-03 DIAGNOSIS — E785 Hyperlipidemia, unspecified: Secondary | ICD-10-CM

## 2015-09-03 LAB — LDL CHOLESTEROL, DIRECT: LDL DIRECT: 149 mg/dL

## 2015-09-03 LAB — LIPID PANEL
Cholesterol: 193 mg/dL (ref 0–200)
HDL: 25.3 mg/dL — AB (ref >39.00)
NONHDL: 167.44
Total CHOL/HDL Ratio: 8
Triglycerides: 319 mg/dL — ABNORMAL HIGH (ref 0.0–149.0)
VLDL: 63.8 mg/dL — AB (ref 0.0–40.0)

## 2015-09-03 LAB — CBC
HEMATOCRIT: 43 % (ref 39.0–52.0)
HEMOGLOBIN: 14.4 g/dL (ref 13.0–17.0)
MCHC: 33.5 g/dL (ref 30.0–36.0)
MCV: 97 fl (ref 78.0–100.0)
Platelets: 186 10*3/uL (ref 150.0–400.0)
RBC: 4.43 Mil/uL (ref 4.22–5.81)
RDW: 14.2 % (ref 11.5–15.5)
WBC: 8.5 10*3/uL (ref 4.0–10.5)

## 2015-09-03 LAB — HEPATIC FUNCTION PANEL
ALT: 7 U/L (ref 0–53)
AST: 10 U/L (ref 0–37)
Albumin: 4.5 g/dL (ref 3.5–5.2)
Alkaline Phosphatase: 64 U/L (ref 39–117)
BILIRUBIN DIRECT: 0.1 mg/dL (ref 0.0–0.3)
BILIRUBIN TOTAL: 0.7 mg/dL (ref 0.2–1.2)
Total Protein: 6.9 g/dL (ref 6.0–8.3)

## 2015-09-03 LAB — BASIC METABOLIC PANEL
BUN: 23 mg/dL (ref 6–23)
BUN: 23 mg/dL (ref 6–23)
CALCIUM: 8.7 mg/dL (ref 8.4–10.5)
CHLORIDE: 105 meq/L (ref 96–112)
CO2: 24 mEq/L (ref 19–32)
CO2: 24 mEq/L (ref 19–32)
CREATININE: 1.55 mg/dL — AB (ref 0.40–1.50)
CREATININE: 1.55 mg/dL — AB (ref 0.40–1.50)
Calcium: 8.7 mg/dL (ref 8.4–10.5)
Chloride: 106 mEq/L (ref 96–112)
GFR: 44.95 mL/min — AB (ref >60.00)
GFR: 44.95 mL/min — ABNORMAL LOW (ref >60.00)
GLUCOSE: 95 mg/dL (ref 70–99)
Glucose, Bld: 95 mg/dL (ref 70–99)
POTASSIUM: 4 meq/L (ref 3.5–5.1)
Potassium: 4 mEq/L (ref 3.5–5.1)
Sodium: 141 mEq/L (ref 135–145)
Sodium: 140 mEq/L (ref 135–145)

## 2015-09-03 LAB — FOLATE: FOLATE: 6.7 ng/mL (ref >5.9)

## 2015-09-03 MED ORDER — CYANOCOBALAMIN 1000 MCG/ML IJ SOLN
1000.0000 ug | Freq: Once | INTRAMUSCULAR | Status: AC
  Administered 2015-09-03: 1000 ug via INTRAMUSCULAR

## 2015-09-03 NOTE — Progress Notes (Signed)
Patient came in for B12 injection and BP check.  Daughter states that the patient has been following orders from Dr. Caryl Bis.  Checked BP in bilateral upper extremities.  See vitals for details.   Patient received B12 injection in right deltoid.  Patient tolerated well.

## 2015-09-05 MED ORDER — LISINOPRIL 20 MG PO TABS: 10.0000 mg | ORAL_TABLET | Freq: Every day | ORAL | Status: DC

## 2015-09-05 NOTE — Addendum Note (Signed)
Addended by: Leone Haven on: 09/05/2015 08:33 PM   Modules accepted: Orders

## 2015-09-05 NOTE — Progress Notes (Signed)
Noted. Will continue with lisinopril 10 mg daily.

## 2015-09-08 ENCOUNTER — Encounter: Payer: Self-pay | Admitting: Family Medicine

## 2015-10-01 ENCOUNTER — Ambulatory Visit (INDEPENDENT_AMBULATORY_CARE_PROVIDER_SITE_OTHER): Payer: Medicare Other | Admitting: Family Medicine

## 2015-10-01 ENCOUNTER — Encounter: Payer: Self-pay | Admitting: Family Medicine

## 2015-10-01 VITALS — BP 130/86 | HR 71 | Temp 98.7°F | Ht 68.0 in | Wt 172.6 lb

## 2015-10-01 DIAGNOSIS — H269 Unspecified cataract: Secondary | ICD-10-CM

## 2015-10-01 DIAGNOSIS — R413 Other amnesia: Secondary | ICD-10-CM

## 2015-10-01 DIAGNOSIS — I10 Essential (primary) hypertension: Secondary | ICD-10-CM | POA: Diagnosis not present

## 2015-10-01 DIAGNOSIS — Z23 Encounter for immunization: Secondary | ICD-10-CM

## 2015-10-01 DIAGNOSIS — N189 Chronic kidney disease, unspecified: Secondary | ICD-10-CM

## 2015-10-01 LAB — BASIC METABOLIC PANEL
BUN: 15 mg/dL (ref 6–23)
CHLORIDE: 101 meq/L (ref 96–112)
CO2: 26 mEq/L (ref 19–32)
CREATININE: 1.39 mg/dL (ref 0.40–1.50)
Calcium: 8.3 mg/dL — ABNORMAL LOW (ref 8.4–10.5)
GFR: 50.97 mL/min — ABNORMAL LOW (ref >60.00)
GLUCOSE: 102 mg/dL — AB (ref 70–99)
POTASSIUM: 3.8 meq/L (ref 3.5–5.1)
Sodium: 137 mEq/L (ref 135–145)

## 2015-10-01 NOTE — Assessment & Plan Note (Signed)
Stable. They've not heard from the [facility] regarding a driving test. Given his vision check today have asked that he not drive. I discussed with his daughter then taking his keys away until they're able to see an eye doctor. They voiced understanding.

## 2015-10-01 NOTE — Progress Notes (Signed)
Pre visit review using our clinic review tool, if applicable. No additional management support is needed unless otherwise documented below in the visit note. 

## 2015-10-01 NOTE — Assessment & Plan Note (Signed)
At goal today. They report not being at goal with the diastolic blood pressures at home. I have asked that they come back for a nurse visit to compare their home monitor to our check in the office. He will continue his lisinopril. We'll check a B met today.

## 2015-10-01 NOTE — Progress Notes (Signed)
Patient ID: [name], male   DOB: 05-05-1923, 81 y.o.   MRN: [id number]  [name], MD Phone: [phone]  [name] is a 81 y.o. male who presents today for follow-up.  HYPERTENSION Disease Monitoring Home BP Monitoring 130 to 140/90's Chest pain- no    Dyspnea- no Medications Compliance-  taking lisinopril 20 mg daily. Lightheadedness-  no  Edema- no  Memory: Patient states his memory is okay. His daughter does note that he is not remembering everything. Symptoms doesn't rhinorrhea for breakfast. He has not gotten lost while driving. He has not been contacted by the [facility] yet for a driving test. His P80 was mildly low at his last visit. He has been getting B12 injections.  Daughter also notes that he has cataracts. Notes it is difficult for him to see small print. Saw ophthalmology in July and they could see the cataracts, though the daughter reports they were not quite ready to remove them. He does have a history of glaucoma and uses his eyedrops. He has not had any eye pain or eye redness. He has not had any acute changes in vision.   PMH: nonsmoker.   ROS see history of present illness  Objective  Physical Exam Filed Vitals:   10/01/15 1348  BP: 130/86  Pulse: 71  Temp: 98.7 F (37.1 C)    BP Readings from Last 3 Encounters:  10/01/15 130/86  09/03/15 128/82  08/27/15 142/84   Wt Readings from Last 3 Encounters:  10/01/15 172 lb 9.6 oz (78.291 kg)  08/27/15 171 lb 6.4 oz (77.747 kg)  07/28/15 169 lb 12.8 oz (77.021 kg)    Physical Exam  Constitutional: No distress.  HENT:  Head: Normocephalic and atraumatic.  Right Ear: External ear normal.  Left Ear: External ear normal.  Mouth/Throat: Oropharynx is clear and moist. No oropharyngeal exudate.  Eyes: Conjunctivae are normal. Pupils are equal, round, and reactive to light.  Cataracts noted on exam, no eye redness or tenderness, difficult to appreciate fundus on exam  Cardiovascular: Normal rate,  regular rhythm and normal heart sounds.   Pulmonary/Chest: Effort normal and breath sounds normal.  Musculoskeletal: He exhibits no edema.  Skin: Skin is warm and dry. He is not diaphoretic.     Assessment/Plan: Please see individual problem list.  Essential hypertension At goal today. They report not being at goal with the diastolic blood pressures at home. I have asked that they come back for a nurse visit to compare their home monitor to our check in the office. He will continue his lisinopril. We'll check a B met today.  Memory deficits Stable. They've not heard from the [facility] regarding a driving test. Given his vision check today have asked that he not drive. I discussed with his daughter then taking his keys away until they're able to see an eye doctor. They voiced understanding.  Cataracts, bilateral Reports history of cataracts. Notes has difficulty seeing up close. Vision check today revealed bilateral 20/200 vision. We have no prior vision check today compared to. Patient does not report any acute vision changes. He has no signs of acute glaucoma on exam or history. Advised that he needed to see his eye doctor soon. Advised that he should not drive until he is seen the eye doctor. I discussed that they should take his keys away so that he cannot drive until he sees the eye doctor. They voiced understanding and stated they would comply with this.    Orders Placed This  Encounter  Procedures  . Flu vaccine HIGH DOSE PF  . Basic Metabolic Panel (BMET)    Dragon voice recognition software was used during the dictation process of this note. If any phrases or words seem inappropriate it is likely secondary to the translation process being inefficient.  [name]

## 2015-10-01 NOTE — Assessment & Plan Note (Signed)
Reports history of cataracts. Notes has difficulty seeing up close. Vision check today revealed bilateral 20/200 vision. We have no prior vision check today compared to. Patient does not report any acute vision changes. He has no signs of acute glaucoma on exam or history. Advised that he needed to see his eye doctor soon. Advised that he should not drive until he is seen the eye doctor. I discussed that they should take his keys away so that he cannot drive until he sees the eye doctor. They voiced understanding and stated they would comply with this.

## 2015-10-01 NOTE — Patient Instructions (Addendum)
Nice to see you. Please see the eye doctor. Please call to set up an appointment as soon as you can. Please do not drive until you see the eye doctor. If you develop eye pain, redness, change in vision, or any new vision issues please seek medical attention immediately We'll check some blood work today. Please bring your blood pressure monitor in to compare to our blood pressure check. You should schedule a nurse visit to have this done.

## 2015-10-04 DIAGNOSIS — H25049 Posterior subcapsular polar age-related cataract, unspecified eye: Secondary | ICD-10-CM | POA: Insufficient documentation

## 2015-10-21 ENCOUNTER — Telehealth: Payer: Self-pay | Admitting: *Deleted

## 2015-10-21 NOTE — Telephone Encounter (Signed)
Roselyn Reef, Please advise if you received this?

## 2015-10-21 NOTE — Telephone Encounter (Signed)
This form is in your red folder to look at and sign.

## 2015-10-21 NOTE — Telephone Encounter (Signed)
Patient's daughter was following up with Dr. Caryl Bis to make sure he received the DMV-medical request for reexamination form. Please advise

## 2015-10-22 ENCOUNTER — Telehealth: Payer: Self-pay | Admitting: Surgical

## 2015-10-22 NOTE — Telephone Encounter (Signed)
Form signed and placed on your desk 

## 2015-10-22 NOTE — Telephone Encounter (Signed)
Spoke with patient son and they was at [employer]. I advised son that if he has any problems over the weekend that he would need to take his father to the ED

## 2015-10-26 ENCOUNTER — Telehealth: Payer: Self-pay | Admitting: Family Medicine

## 2015-10-26 ENCOUNTER — Ambulatory Visit (INDEPENDENT_AMBULATORY_CARE_PROVIDER_SITE_OTHER): Payer: Medicare Other | Admitting: Family Medicine

## 2015-10-26 ENCOUNTER — Telehealth: Payer: Self-pay

## 2015-10-26 ENCOUNTER — Encounter: Payer: Self-pay | Admitting: Family Medicine

## 2015-10-26 ENCOUNTER — Ambulatory Visit
Admission: RE | Admit: 2015-10-26 | Discharge: 2015-10-26 | Disposition: A | Discharge status: Home / Self Care | Payer: Medicare Other | Source: Ambulatory Visit | Attending: Family Medicine | Admitting: Family Medicine

## 2015-10-26 VITALS — BP 136/84 | HR 73 | Temp 98.6°F | Ht 68.0 in | Wt 163.4 lb

## 2015-10-26 DIAGNOSIS — Z23 Encounter for immunization: Secondary | ICD-10-CM

## 2015-10-26 DIAGNOSIS — W19XXXA Unspecified fall, initial encounter: Secondary | ICD-10-CM | POA: Diagnosis not present

## 2015-10-26 DIAGNOSIS — S0990XA Unspecified injury of head, initial encounter: Secondary | ICD-10-CM

## 2015-10-26 DIAGNOSIS — F0390 Unspecified dementia without behavioral disturbance: Secondary | ICD-10-CM | POA: Diagnosis not present

## 2015-10-26 NOTE — Telephone Encounter (Signed)
[name] from [facility] imaging called CT results to office,  NEGATIVE for Age noncontrast CT of Brain No acute abnormalities, no acute tramuatic injuries notified.   Daughter requesting a call with results.  [place] [place] [phone] per the radiology tech.   Please advise.

## 2015-10-26 NOTE — Telephone Encounter (Signed)
Called DSS and made report.  They will submit to two reviews and either mail or call with outcome. Will follow up with patient.

## 2015-10-26 NOTE — Patient Instructions (Signed)
Nice to see you. Please go get the CT scan of your head. If you develop headache, vision changes, lightheadedness, numbness, weakness, or any new or changing symptoms please seek medical attention immediately.

## 2015-10-26 NOTE — Progress Notes (Signed)
Pre visit review using our clinic review tool, if applicable. No additional management support is needed unless otherwise documented below in the visit note. 

## 2015-10-26 NOTE — Telephone Encounter (Signed)
Will forward to CMA as I sent result note to her and she tried to call patient and step daughter already.

## 2015-10-26 NOTE — Telephone Encounter (Signed)
Please call DSS.

## 2015-10-26 NOTE — Progress Notes (Signed)
Patient ID: SAUL LAWERY, male   DOB: November 18, 1922, 82 y.o.   MRN: [id number]  [name], MD Phone: [phone]  SHAWNTA DAHER is a 82 y.o. male who presents today for follow-up. Most of the history surrounding the patient's fall comes from the patient's stepdaughter.  Patient comes in with his stepdaughter today. Notes last week on Friday he fell. He was evidently out in the road near his house and found on the ground. He had no loss of consciousness. He was alert. He had a laceration to his right cheek. There is documentation from his daughter that will be scanned into the chart that outlines the events. He was taken to a fast med and received stitches. He was felt to be at his baseline per the daughter. Since the fall he has possibly been mildly more confused, though she notes he was confused prior to this and may be at his baseline. He has dementia per prior testing. With the fall he had no loss of consciousness, shortness of breath, chest pain, palpitations, headache, weakness, numbness. He has not had any of that since the fall. He is not on any blood thinners. Daughter notes his appetite has not been good recently. He's not had any abdominal pain, vomiting, or diarrhea.   PMH: nonsmoker.   ROS see history of present illness  Objective  Physical Exam Filed Vitals:   10/26/15 1058  BP: 136/84  Pulse: 73  Temp: 98.6 F (37 C)    Physical Exam  Constitutional: He is well-developed, well-nourished, and in no distress.  Patient does not appear to remember much of the events surrounding the fall or following a fall  HENT:  Head: Normocephalic and atraumatic.  Right Ear: External ear normal.  Left Ear: External ear normal.  Mouth/Throat: Oropharynx is clear and moist. No oropharyngeal exudate.  There is a well-healing laceration on his right upper cheek with several stitches in place, there is no bony step-offs or defects, there is no tenderness  Eyes: Conjunctivae are normal.  Pupils are equal, round, and reactive to light.  Neck: Neck supple.  Cardiovascular: Normal rate, regular rhythm and normal heart sounds.  Exam reveals no gallop and no friction rub.   No murmur heard. Pulmonary/Chest: Effort normal and breath sounds normal. No respiratory distress. He has no wheezes. He has no rales.  Lymphadenopathy:    He has no cervical adenopathy.  Neurological: He is alert.  CN 2-12 intact, 5/5 strength in bilateral biceps, triceps, grip, quads, hamstrings, plantar and dorsiflexion, sensation to light touch intact in bilateral UE and LE, slow but steady gait, absent patellar reflexes  Skin: Skin is warm and dry. He is not diaphoretic.     Assessment/Plan: Please see individual problem list.  Dementia Patient with prior moca testing consistent with dementia. He has subsequently developed decreased ability to care for himself at home. I am concerned that his dementia has progressed and that he will be unable to care for himself at home. I'm also concerned that he does not have the capacity to make medical decisions for himself. There is no healthcare power of attorney in place. Evidently there are no blood relatives either. Given his current situation I think evaluation by DSS and home health social work would be beneficial to determine his ability to care for himself and help the process of getting a guardian. Patient is adamant that he does not need anybody to help him. Referral placed to home health. We'll have our nurse team  lead call DSS.  Fall with injury to face Patient is status post fall. The history surrounding the fall is somewhat unclear, though it appears that he did not have loss of consciousness or other symptoms. He has no neurological defects at this time other than some confusion which may be chronic. Given his fall and the possibility of worsening confusion since the fall we'll obtain a stat CT scan of his head today to evaluate for intracranial pathology.  His laceration appears to be well healing at this time. He'll return either here on Friday or to fast med for removal of sutures. He is given return precautions.    Orders Placed This Encounter  Procedures  . CT Head Wo Contrast    Standing Status: Future     Number of Occurrences:      Standing Expiration Date: 01/23/2017    Order Specific Question:  Reason for Exam (SYMPTOM  OR DIAGNOSIS REQUIRED)    Answer:  head injury after fall, confusion surrounding events of fall    Order Specific Question:  Preferred imaging location?    Answer:  [facility]    Order Specific Question:  Call Results- Best Contact Number?    AnswerAY:[phone]  . Flu Vaccine QUAD 36+ mos IM  . Ambulatory referral to Home Health    Referral Priority:  Routine    Referral Type:  Home Health Care    Referral Reason:  Specialty Services Required    Requested Specialty:  [place]    Number of Visits Requested:  1    Dragon voice recognition software was used during the dictation process of this note. If any phrases or words seem inappropriate it is likely secondary to the translation process being inefficient.  [name]

## 2015-10-26 NOTE — Assessment & Plan Note (Signed)
Patient is status post fall. The history surrounding the fall is somewhat unclear, though it appears that he did not have loss of consciousness or other symptoms. He has no neurological defects at this time other than some confusion which may be chronic. Given his fall and the possibility of worsening confusion since the fall we'll obtain a stat CT scan of his head today to evaluate for intracranial pathology. His laceration appears to be well healing at this time. He'll return either here on Friday or to fast med for removal of sutures. He is given return precautions.

## 2015-10-26 NOTE — Telephone Encounter (Signed)
Gave Step daughter results. Patient was in the car with her so she gave him the results.

## 2015-10-26 NOTE — Assessment & Plan Note (Addendum)
Patient with prior moca testing consistent with dementia. He has subsequently developed decreased ability to care for himself at home. I am concerned that his dementia has progressed and that he will be unable to care for himself at home. I'm also concerned that he does not have the capacity to make medical decisions for himself. There is no healthcare power of attorney in place. Evidently there are no blood relatives either. Given his current situation I think evaluation by DSS and home health social work would be beneficial to determine his ability to care for himself and help the process of getting a guardian. Patient is adamant that he does not need anybody to help him. Referral placed to home health. We'll have our nurse team lead call DSS.

## 2015-10-27 NOTE — Telephone Encounter (Signed)
Noted  

## 2015-10-28 NOTE — Telephone Encounter (Signed)
Form mailed to [employer]. 

## 2015-10-29 ENCOUNTER — Ambulatory Visit (INDEPENDENT_AMBULATORY_CARE_PROVIDER_SITE_OTHER): Payer: Medicare Other | Admitting: Family Medicine

## 2015-10-29 ENCOUNTER — Telehealth: Payer: Self-pay | Admitting: Family Medicine

## 2015-10-29 ENCOUNTER — Encounter: Payer: Self-pay | Admitting: Family Medicine

## 2015-10-29 VITALS — BP 114/68 | HR 61 | Temp 98.2°F | Ht 68.0 in | Wt 163.8 lb

## 2015-10-29 DIAGNOSIS — W19XXXA Unspecified fall, initial encounter: Secondary | ICD-10-CM

## 2015-10-29 DIAGNOSIS — M79675 Pain in left toe(s): Secondary | ICD-10-CM

## 2015-10-29 MED ORDER — DOXYCYCLINE HYCLATE 100 MG PO TABS: 100.0000 mg | ORAL_TABLET | Freq: Two times a day (BID) | ORAL | Status: DC

## 2015-10-29 NOTE — Progress Notes (Signed)
Patient ID: [name], male   DOB: [birth year], 82 y.o.   MRN: [id number]  Nathaniel Rumps, MD Phone: [phone]  [name] is a 82 y.o. male who presents today for follow-up and suture removal.  Patient presents in follow-up for suture removal. Please see prior note outlining why he had sutures. It has been 7 days since the sutures were placed. He's not had any headache, vision changes, numbness, weakness, drainage, or fever. There is been no erythema surrounding the laceration. Notes the area is mildly sore and bruised still. Patient's stepdaughter notes that he has been rubbing the area.   ROS see history of present illness  Objective  Physical Exam Filed Vitals:   10/29/15 0948  BP: 114/68  Pulse: 61  Temp: 98.2 F (36.8 C)    Physical Exam  Constitutional: He is well-developed, well-nourished, and in no distress.  HENT:  Head:    Skin:  Distal aspect of the laceration appears to not be quite healed, though is well approximated, no subcutaneous tissue visible, there is no fluctuance or induration, there is no erythema, there is minimal tenderness to the area, there is mild bruising surrounding the laceration     Assessment/Plan: Please see individual problem list.  Fall with injury to face Proximal aspect of laceration is well-healed, distal aspect laceration with possible mild infection surrounding suture given that there is minimal pus noted. This area is well approximated, though has not completely healed. 3 Sutures were removed today given well healing proximally and possible infection distally. We will place the patient on doxycycline to cover for infection. The area was cleaned with sterile saline by nursing and a bandage was placed on the area. Discussed wound care. Advised to monitor for signs of infection. Given return precautions. We'll see early next week for follow-up.   will take a probiotic while on antibiotics.  Meds ordered this encounter    Medications  . doxycycline (VIBRA-TABS) 100 MG tablet    Sig: Take 1 tablet (100 mg total) by mouth 2 (two) times daily.    Dispense:  14 tablet    Refill:  0    Dragon voice recognition software was used during the dictation process of this note. If any phrases or words seem inappropriate it is likely secondary to the translation process being inefficient.  [name]

## 2015-10-29 NOTE — Progress Notes (Signed)
Pre visit review using our clinic review tool, if applicable. No additional management support is needed unless otherwise documented below in the visit note. 

## 2015-10-29 NOTE — Patient Instructions (Signed)
Nice to see you. We removed 3 sutures from or face today. It appears that the distal suture may have become infected. There is a mild amount of drainage with this. It will take some time for that area to heal properly. We will treat you with an antibiotic called doxycycline. If you develop redness, increased drainage, fever, pain in her face, pain in her eyes, headaches, vision changes, numbness, weakness, or any new or changing symptoms please seek medical attention.

## 2015-10-29 NOTE — Telephone Encounter (Signed)
[name] [id number] called from [place] regarding requesting visit to be delayed until after the weather daughter will be taking her dad home til after the weather next week? [name] states Mr Laverde is doing good. Thank you!

## 2015-10-29 NOTE — Telephone Encounter (Signed)
FYI

## 2015-10-29 NOTE — Assessment & Plan Note (Addendum)
Proximal aspect of laceration is well-healed, distal aspect laceration with possible mild infection surrounding suture given that there is minimal pus noted. This area is well approximated, though has not completely healed. 3 Sutures were removed today given well healing proximally and possible infection distally. We will place the patient on doxycycline to cover for infection. The area was cleaned with sterile saline by nursing and a bandage was placed on the area. Discussed wound care. Advised to monitor for signs of infection. Given return precautions. We'll see early next week for follow-up.

## 2015-11-02 ENCOUNTER — Ambulatory Visit (INDEPENDENT_AMBULATORY_CARE_PROVIDER_SITE_OTHER): Payer: Medicare Other | Admitting: Family Medicine

## 2015-11-02 ENCOUNTER — Encounter: Payer: Self-pay | Admitting: Family Medicine

## 2015-11-02 VITALS — BP 124/74 | HR 68 | Temp 98.5°F | Ht 68.0 in | Wt 161.8 lb

## 2015-11-02 DIAGNOSIS — Z5189 Encounter for other specified aftercare: Secondary | ICD-10-CM | POA: Diagnosis not present

## 2015-11-02 NOTE — Progress Notes (Signed)
Patient ID: [name], male   DOB: 05-01-1923, 82 y.o.   MRN: [id number]  [name], MD Phone: [phone]  [name] is a 82 y.o. male who presents today for wound check.  Wound is healing well. No further drainage. No fevers. No pain. No redness. No vision changes. No numbness or weakness. Has been taking the doxycycline.   ROS see history of present illness  Objective  Physical Exam Filed Vitals:   11/02/15 1022  BP: 124/74  Pulse: 68  Temp: 98.5 F (36.9 C)   Physical Exam  Constitutional: He is well-developed, well-nourished, and in no distress.  HENT:  Well healing laceration on right cheek out 3 cm in length, there is scab over the distal portion of this, there is no opening to the laceration, there is no drainage, there is no erythema, there is no fluctuance, there is no tenderness, there is no bony tenderness  Eyes: Conjunctivae are normal. Pupils are equal, round, and reactive to light.  Cardiovascular: Normal rate, regular rhythm and normal heart sounds.   Pulmonary/Chest: Effort normal and breath sounds normal.  Neurological: He is alert. Gait normal.     Assessment/Plan: Please see individual problem list.  Encounter for wound re-check Laceration is well-healing. There are no signs of infection. Patient appears to be doing well. He will finish his antibiotics. They will continue to monitor the wound. Advised not to use a bandage and that they can use soap and water to cleanse the area. Given return precautions.   Dragon Armed forces training and education officer was used during the dictation process of this note. If any phrases or words seem inappropriate it is likely secondary to the translation process being inefficient.  [name]

## 2015-11-02 NOTE — Assessment & Plan Note (Signed)
Laceration is well-healing. There are no signs of infection. Patient appears to be doing well. He will finish his antibiotics. They will continue to monitor the wound. Advised not to use a bandage and that they can use soap and water to cleanse the area. Given return precautions.

## 2015-11-02 NOTE — Patient Instructions (Signed)
Your wound looks like it is healing well. Please finish the antibiotics. Please monitor the wound. You can clean it with soap and water. You do not need to keep a bandage on it. If it develops drainage, redness, opens up, swelling, or if you develop vision changes, numbness, weakness, or fever or any new or change in symptoms please seek medical attention.

## 2015-11-02 NOTE — Telephone Encounter (Signed)
FYI

## 2015-11-02 NOTE — Telephone Encounter (Signed)
Noted. Thanks.

## 2015-11-02 NOTE — Progress Notes (Signed)
Pre visit review using our clinic review tool, if applicable. No additional management support is needed unless otherwise documented below in the visit note. 

## 2015-11-11 DIAGNOSIS — H348392 Tributary (branch) retinal vein occlusion, unspecified eye, stable: Secondary | ICD-10-CM | POA: Insufficient documentation

## 2015-11-15 ENCOUNTER — Encounter: Payer: Self-pay | Admitting: Emergency Medicine

## 2015-11-15 ENCOUNTER — Inpatient Hospital Stay
Admission: EM | Admit: 2015-11-15 | Discharge: 2015-11-22 | DRG: 391 | Disposition: A | Discharge status: Skilled Nursing Facility | Payer: Medicare Other | Attending: Internal Medicine | Admitting: Internal Medicine

## 2015-11-15 ENCOUNTER — Telehealth: Payer: Self-pay | Admitting: Family Medicine

## 2015-11-15 DIAGNOSIS — Z23 Encounter for immunization: Secondary | ICD-10-CM | POA: Diagnosis not present

## 2015-11-15 DIAGNOSIS — G309 Alzheimer's disease, unspecified: Secondary | ICD-10-CM | POA: Diagnosis present

## 2015-11-15 DIAGNOSIS — F0390 Unspecified dementia without behavioral disturbance: Secondary | ICD-10-CM | POA: Diagnosis not present

## 2015-11-15 DIAGNOSIS — E43 Unspecified severe protein-calorie malnutrition: Secondary | ICD-10-CM | POA: Diagnosis present

## 2015-11-15 DIAGNOSIS — H409 Unspecified glaucoma: Secondary | ICD-10-CM | POA: Diagnosis present

## 2015-11-15 DIAGNOSIS — E860 Dehydration: Secondary | ICD-10-CM | POA: Diagnosis present

## 2015-11-15 DIAGNOSIS — H9190 Unspecified hearing loss, unspecified ear: Secondary | ICD-10-CM | POA: Diagnosis present

## 2015-11-15 DIAGNOSIS — F0280 Dementia in other diseases classified elsewhere without behavioral disturbance: Secondary | ICD-10-CM | POA: Diagnosis present

## 2015-11-15 DIAGNOSIS — I10 Essential (primary) hypertension: Secondary | ICD-10-CM | POA: Diagnosis present

## 2015-11-15 DIAGNOSIS — R197 Diarrhea, unspecified: Secondary | ICD-10-CM | POA: Diagnosis present

## 2015-11-15 DIAGNOSIS — Z79899 Other long term (current) drug therapy: Secondary | ICD-10-CM | POA: Diagnosis not present

## 2015-11-15 DIAGNOSIS — R609 Edema, unspecified: Secondary | ICD-10-CM

## 2015-11-15 DIAGNOSIS — Z6825 Body mass index (BMI) 25.0-25.9, adult: Secondary | ICD-10-CM | POA: Diagnosis not present

## 2015-11-15 DIAGNOSIS — N390 Urinary tract infection, site not specified: Secondary | ICD-10-CM | POA: Diagnosis present

## 2015-11-15 DIAGNOSIS — E785 Hyperlipidemia, unspecified: Secondary | ICD-10-CM | POA: Diagnosis present

## 2015-11-15 DIAGNOSIS — N400 Enlarged prostate without lower urinary tract symptoms: Secondary | ICD-10-CM | POA: Diagnosis present

## 2015-11-15 DIAGNOSIS — R627 Adult failure to thrive: Secondary | ICD-10-CM | POA: Diagnosis present

## 2015-11-15 DIAGNOSIS — D649 Anemia, unspecified: Secondary | ICD-10-CM | POA: Diagnosis present

## 2015-11-15 DIAGNOSIS — Z8551 Personal history of malignant neoplasm of bladder: Secondary | ICD-10-CM

## 2015-11-15 DIAGNOSIS — R2241 Localized swelling, mass and lump, right lower limb: Secondary | ICD-10-CM | POA: Diagnosis present

## 2015-11-15 LAB — CBC
HEMATOCRIT: 38.3 % — AB (ref 40.0–52.0)
Hemoglobin: 12.9 g/dL — ABNORMAL LOW (ref 13.0–18.0)
MCH: 31.2 pg (ref 26.0–34.0)
MCHC: 33.6 g/dL (ref 32.0–36.0)
MCV: 92.9 fL (ref 80.0–100.0)
Platelets: 228 10*3/uL (ref 150–440)
RBC: 4.12 MIL/uL — ABNORMAL LOW (ref 4.40–5.90)
RDW: 15.5 % — AB (ref 11.5–14.5)
WBC: 8.6 10*3/uL (ref 3.8–10.6)

## 2015-11-15 LAB — COMPREHENSIVE METABOLIC PANEL
ALBUMIN: 3.9 g/dL (ref 3.5–5.0)
ALT: 11 U/L — ABNORMAL LOW (ref 17–63)
AST: 14 U/L — AB (ref 15–41)
Alkaline Phosphatase: 59 U/L (ref 38–126)
Anion gap: 9 (ref 5–15)
BUN: 17 mg/dL (ref 6–20)
CHLORIDE: 109 mmol/L (ref 101–111)
CO2: 23 mmol/L (ref 22–32)
Calcium: 8.4 mg/dL — ABNORMAL LOW (ref 8.9–10.3)
Creatinine, Ser: 1.15 mg/dL (ref 0.61–1.24)
GFR calc Af Amer: >60 mL/min (ref >60)
GFR calc non Af Amer: 54 mL/min — ABNORMAL LOW (ref >60)
GLUCOSE: 99 mg/dL (ref 65–99)
POTASSIUM: 3.6 mmol/L (ref 3.5–5.1)
SODIUM: 141 mmol/L (ref 135–145)
Total Bilirubin: 1.2 mg/dL (ref 0.3–1.2)
Total Protein: 7.3 g/dL (ref 6.5–8.1)

## 2015-11-15 LAB — LIPASE, BLOOD: LIPASE: 39 U/L (ref 11–51)

## 2015-11-15 LAB — URINALYSIS COMPLETE WITH MICROSCOPIC (ARMC ONLY)
Bilirubin Urine: NEGATIVE
GLUCOSE, UA: NEGATIVE mg/dL
Ketones, ur: NEGATIVE mg/dL
LEUKOCYTES UA: NEGATIVE
Nitrite: NEGATIVE
PH: 5 (ref 5.0–8.0)
Protein, ur: 100 mg/dL — AB
SQUAMOUS EPITHELIAL / LPF: NONE SEEN
Specific Gravity, Urine: 1.019 (ref 1.005–1.030)

## 2015-11-15 LAB — C DIFFICILE QUICK SCREEN W PCR REFLEX
C DIFFICILE (CDIFF) TOXIN: NEGATIVE
C DIFFICLE (CDIFF) ANTIGEN: NEGATIVE
C Diff interpretation: NEGATIVE

## 2015-11-15 MED ORDER — DUTASTERIDE 0.5 MG PO CAPS
0.5000 mg | ORAL_CAPSULE | Freq: Every day | ORAL | Status: DC
  Administered 2015-11-15 – 2015-11-22 (×8): 0.5 mg via ORAL
  Filled 2015-11-15 (×8): qty 1

## 2015-11-15 MED ORDER — ONDANSETRON HCL 4 MG/2ML IJ SOLN
4.0000 mg | Freq: Four times a day (QID) | INTRAMUSCULAR | Status: DC | PRN
Start: 2015-11-15 — End: 2015-11-22

## 2015-11-15 MED ORDER — ONDANSETRON HCL 4 MG PO TABS: 4.0000 mg | ORAL_TABLET | Freq: Four times a day (QID) | ORAL | Status: DC | PRN

## 2015-11-15 MED ORDER — SODIUM CHLORIDE 0.9 % IV BOLUS (SEPSIS)
500.0000 mL | Freq: Once | INTRAVENOUS | Status: AC
  Administered 2015-11-15: 500 mL via INTRAVENOUS

## 2015-11-15 MED ORDER — LATANOPROST 0.005 % OP SOLN
1.0000 [drp] | Freq: Every day | OPHTHALMIC | Status: DC
  Administered 2015-11-15 – 2015-11-17 (×2): 1 [drp] via OPHTHALMIC
  Filled 2015-11-15: qty 2.5

## 2015-11-15 MED ORDER — BRINZOLAMIDE 1 % OP SUSP
1.0000 [drp] | Freq: Two times a day (BID) | OPHTHALMIC | Status: DC
  Administered 2015-11-15 – 2015-11-22 (×5): 1 [drp] via OPHTHALMIC
  Filled 2015-11-15: qty 10

## 2015-11-15 MED ORDER — DEXTROSE 5 % IV SOLN
1.0000 g | Freq: Every day | INTRAVENOUS | Status: DC
  Administered 2015-11-15 – 2015-11-16 (×2): 1 g via INTRAVENOUS
  Filled 2015-11-15 (×3): qty 10

## 2015-11-15 MED ORDER — PNEUMOCOCCAL VAC POLYVALENT 25 MCG/0.5ML IJ INJ
0.5000 mL | INJECTION | INTRAMUSCULAR | Status: AC
  Administered 2015-11-16: 0.5 mL via INTRAMUSCULAR
  Filled 2015-11-15: qty 0.5

## 2015-11-15 MED ORDER — ACETAMINOPHEN 325 MG PO TABS
650.0000 mg | ORAL_TABLET | Freq: Four times a day (QID) | ORAL | Status: DC | PRN
Start: 2015-11-15 — End: 2015-11-22
  Administered 2015-11-16: 01:00:00 650 mg via ORAL
  Filled 2015-11-15: qty 2

## 2015-11-15 MED ORDER — ENOXAPARIN SODIUM 30 MG/0.3ML ~~LOC~~ SOLN: 30.0000 mg | SUBCUTANEOUS | Status: DC

## 2015-11-15 MED ORDER — LISINOPRIL 10 MG PO TABS
10.0000 mg | ORAL_TABLET | Freq: Every day | ORAL | Status: DC
  Administered 2015-11-16 – 2015-11-21 (×6): 10 mg via ORAL
  Filled 2015-11-15 (×6): qty 1

## 2015-11-15 MED ORDER — ACETAMINOPHEN 650 MG RE SUPP: 650.0000 mg | Freq: Four times a day (QID) | RECTAL | Status: DC | PRN

## 2015-11-15 MED ORDER — SODIUM CHLORIDE 0.9 % IV SOLN
Freq: Once | INTRAVENOUS | Status: AC
  Administered 2015-11-15: 23:00:00 via INTRAVENOUS

## 2015-11-15 MED ORDER — ENOXAPARIN SODIUM 40 MG/0.4ML ~~LOC~~ SOLN
40.0000 mg | SUBCUTANEOUS | Status: DC
  Administered 2015-11-15: 23:00:00 40 mg via SUBCUTANEOUS
  Filled 2015-11-15: qty 0.4

## 2015-11-15 MED ORDER — HYDRALAZINE HCL 20 MG/ML IJ SOLN
10.0000 mg | INTRAMUSCULAR | Status: DC | PRN
  Administered 2015-11-15: 10 mg via INTRAVENOUS
  Filled 2015-11-15 (×2): qty 1

## 2015-11-15 MED ORDER — OXYCODONE HCL 5 MG PO TABS: 5.0000 mg | ORAL_TABLET | ORAL | Status: DC | PRN

## 2015-11-15 NOTE — ED Notes (Signed)
Pt presents with stepdaughter, who cares for him. She states that pt is very independent and lives alone, but she has noticed that he is having difficulty with ADL's. States he doesn't wear clean clothes, and that she doubts he is cleaning himself. She has noticed in the bathroom that he appears to have had diarrhea for a time, but he denies it. She states she saw a blood clot in the toilet today about the size of her fingertip. She states that he has hx of bladder cancer, and that they were told by MD that he may continue to pass some blood, but to seek help if it became excessive. Pt still sees cancer doc annually (this was in 2008). Pt alert and has difficulty hearing.

## 2015-11-15 NOTE — ED Notes (Addendum)
Diarrhea for the past two weeks, patient has history of dementia, no complaints.  Step-daughter noticed blood clot in toliet yesterday, dark red, approx quarter sized.  Does pass blood at times since has had bladder cancer.

## 2015-11-15 NOTE — H&P (Signed)
[facility] NAME: [name]    MR#:  [id number]  DATE OF BIRTH:  23-Sep-1923  DATE OF ADMISSION:  11/15/2015  PRIMARY CARE PHYSICIAN: [name], MD   REQUESTING/REFERRING PHYSICIAN: dr Marcelene Butte  CHIEF COMPLAINT:   Diarrhea for 2 weeks ,progressive weaknes and confusion HISTORY OF PRESENT ILLNESS:  [name]  is a 82 y.o. male with a known history of hyperlipidemia, bladder cancer status post treatment, BPH, hypertension, glaucoma comes to the emergency room from home accompanied by his stepdaughter. Patient lives alone and is looked up on by daughter is visiting on daily basis. He has been having diarrheal stools for approximately 2 weeks. Patient has was covered in dried feces and was in found in filthy condition by daughter. She is had poor by mouth intake. No fever. The emergency room patient is hemodynamically stable however he was found to be clinically dehydrated and found to have UTI and significant diarrhea is being admitted for further evaluation and management. Daughter does not report any falls at home.   PAST MEDICAL HISTORY:   Past Medical History  Diagnosis Date  . Arrhythmia     pvcs  . Hyperlipidemia   . Cancer ([facility])     bladder  . BPH (benign prostatic hyperplasia)   . Hypertension   . PONV (postoperative nausea and vomiting)   . Glaucoma   . Fracture of right talus     diagonosed 12/22/13- last f/u note on chart dated 01/02/14.     PAST SURGICAL HISTOIRY:   Past Surgical History  Procedure Laterality Date  . Bladder surgery  4 or 5 years ago    "cut bladder cancer out"  . Tonsillectomy and adenoidectomy  as child  . Cystoscopy/retrograde/ureteroscopy Bilateral 11/17/2013    Procedure: CYSTOSCOPY, bilateral /RETROGRADE/ right URETEROSCOPY, right stent, bladder fulgeration;  Surgeon: Bernestine Amass, MD;  Location: [facility];  Service: Urology;  Laterality: Bilateral;  . Cystoscopy with retrograde  pyelogram, ureteroscopy and stent placement Bilateral 01/12/2014    Procedure: CYSTOSCOPY WITH BILATERAL RETROGRADE PYELOGRAM/URETERAL STENT REMOVAL, URETEROSCOPY, LEFT  AND  BLADDER BIOPSY;  Surgeon: Bernestine Amass, MD;  Location: [facility];  Service: Urology;  Laterality: Bilateral;    SOCIAL HISTORY:   Social History  Substance Use Topics  . Smoking status: Never Smoker   . Smokeless tobacco: Never Used  . Alcohol Use: No     Comment: rare    FAMILY HISTORY:   Family History  Problem Relation Age of Onset  . Heart attack Father 76    deceased from mi  . Hypertension Father   . Stroke Father   . Breast cancer Mother 61    breast cancer  . Cancer Mother     DRUG ALLERGIES:  No Known Allergies  REVIEW OF SYSTEMS:  Review of Systems  Constitutional: Negative for fever, chills and weight loss.  HENT: Positive for hearing loss. Negative for ear discharge, ear pain and nosebleeds.   Eyes: Negative for blurred vision, pain and discharge.  Respiratory: Negative for sputum production, shortness of breath, wheezing and stridor.   Cardiovascular: Negative for chest pain, palpitations, orthopnea and PND.  Gastrointestinal: Positive for diarrhea. Negative for nausea, vomiting and abdominal pain.  Genitourinary: Negative for urgency and frequency.  Musculoskeletal: Negative for back pain and joint pain.  Neurological: Positive for weakness. Negative for sensory change, speech change and focal weakness.  Psychiatric/Behavioral: Negative for depression and hallucinations. The patient is not  nervous/anxious.   All other systems reviewed and are negative.    MEDICATIONS AT HOME:   Prior to Admission medications   Medication Sig Start Date End Date Taking? Authorizing Provider  brinzolamide (AZOPT) 1 % ophthalmic suspension Place 1 drop into both eyes 2 (two) times daily.    Historical Provider, MD  doxycycline (VIBRA-TABS) 100 MG tablet Take 1 tablet (100 mg total) by mouth 2 (two)  times daily. 10/29/15   Leone Haven, MD  dutasteride (AVODART) 0.5 MG capsule Take 0.5 mg by mouth daily.     Historical Provider, MD  latanoprost (XALATAN) 0.005 % ophthalmic solution Place 1 drop into both eyes at bedtime.    Historical Provider, MD  lisinopril (PRINIVIL,ZESTRIL) 20 MG tablet Take 0.5 tablets (10 mg total) by mouth daily. 09/05/15   Leone Haven, MD      VITAL SIGNS:  Blood pressure 167/94, pulse 62, temperature 98.2 F (36.8 C), temperature source Oral, resp. rate 20, height 5\' 6"  (1.676 m), weight 73.483 kg (162 lb), SpO2 98 %.  PHYSICAL EXAMINATION:  GENERAL:  82 y.o.-year-old patient lying in the bed with no acute distress.  EYES: Pupils equal, round, reactive to light and accommodation. No scleral icterus. Extraocular muscles intact.  HEENT: Head atraumatic, normocephalic. Oropharynx and nasopharynx clear.  NECK:  Supple, no jugular venous distention. No thyroid enlargement, no tenderness.  LUNGS: Normal breath sounds bilaterally, no wheezing, rales,rhonchi or crepitation. No use of accessory muscles of respiration.  CARDIOVASCULAR: S1, S2 normal. No murmurs, rubs, or gallops.  ABDOMEN: Soft, nontender, nondistended. Bowel sounds present. No organomegaly or mass.  EXTREMITIES:++ pedal edema, no  cyanosis, or clubbing.  NEUROLOGIC: unable exam neuro in detail secondary to patient's dementia and impaired hearing grossly appears intact. PSYCHIATRIC: The patient is alert and oriented x 3.  SKIN: No obvious rash, lesion, or ulcer. Patient has bruises and ecchymosis in both lower extremitie  LABORATORY PANEL:   CBC  Recent Labs Lab 11/15/15 1322  WBC 8.6  HGB 12.9*  HCT 38.3*  PLT 228   ------------------------------------------------------------------------------------------------------------------  Chemistries   Recent Labs Lab 11/15/15 1322  NA 141  K 3.6  CL 109  CO2 23  GLUCOSE 99  BUN 17  CREATININE 1.15  CALCIUM 8.4*  AST 14*  ALT  11*  ALKPHOS 39  BILITOT 1.2    IMPRESSION AND PLAN:   [name]  is a 82 y.o. male with a known history of hyperlipidemia, bladder cancer status post treatment, BPH, hypertension, glaucoma comes to the emergency room from home accompanied by his stepdaughter. Patient lives alone and is looked up on by daughter is visiting on daily basis. He has been having diarrheal stools for approximately 2 weeks  1. Acute urinary tract infection with hematuria in the setting of history of bladder cancer with treatment in the past. -IV fluids for hydration -IV Rocephin -Follow up urine culture -Changed to oral antibiotics when culture results available  2. Subacute diarrhea for 2 weeks  -Appears clinically dehydrated  -IV fluids  -C. difficile pending   3. Failure to thrive Patient lives at home by himself. Daughter is it. Patient has been having diarrhea. Was found in [place] living condition. No recent falls. Daughters are worried about safety at home. -Education officer, museum for discharge planning  4. Hypertension continue home meds  5. DVT prophylaxis subcutaneous Lovenox  6. Generalized weakness deconditioning physical therapy to see    All the records are reviewed and case discussed with ED provider.  Management plans discussed with the patient, family and they are in agreement.  CODE STATUS: Full per daughter   TOTAL TIME TAKING CARE OF THIS PATIENT: 45  minutes.    Inman Fettig M.D on 11/15/2015 at 6:26 PM  Between 7am to 6pm - Pager - [phone]  After 6pm go to [email or web address] - password [email or web address] [facility]  [facility]  Office  [phone]  CC: Primary care physician; [name], MD

## 2015-11-15 NOTE — Telephone Encounter (Signed)
Pt daughter called about her dad wetting himself and now diarrhea for last week it could have been longer she's not sure. Pt will not leave the house. Daughter wants to know what should she do? Call daughter @ [phone]. Thank you!

## 2015-11-15 NOTE — Telephone Encounter (Signed)
FYI: Pt has not been eating or drinking well. Daughter states that her dad has been having diarrhea for 2weeks not sure if dad is dehydrated. I stated that she needed to get him to Urgent Care or ED as soon as possible. Daughter stated that she would take him when she got off of work.

## 2015-11-15 NOTE — ED Notes (Signed)
Pt needed to be changed, so this nurse and NT Juanetta cleaned pt. Pt's socks had obviously not been changed in some time, and his feet were covered in dried feces and dirt. Dried feces all down pt's legs as well. Genitals had multiple red and sore places where feces was uncleaned.

## 2015-11-15 NOTE — Telephone Encounter (Signed)
Noted. Agree that patient needs evaluation. If they are unable to bring him to our office today he should be seen at urgent care.

## 2015-11-15 NOTE — ED Provider Notes (Addendum)
[facility] Emergency Department Provider Note  ____________________________________________   I have reviewed the triage vital signs and the nursing notes.   HISTORY  Chief Complaint Diarrhea    HPI [name] is a 82 y.o. male who recently was put on antibiotics because he had a laceration to his cheek. Antibiotics ended he thinks a few weeks ago. He has had several episodes of diarrhea since then. Patient is a poor historian he is very hard of hearing. According to family at bedside, he has had multiple loose stools. There is no clear indication of how many he has had. The patient himself denies any other complaint. He states he feels "pretty good". The patient has been offered home health and refused it in the past. The patient has had no lightheadedness or vomiting or fever or abdominal pain or dysuria. He does have a history of bladder cancer and does have a history of hematuria which is chronic. He does follow with a urologist for this. The patient is here today because there is a trace amount of blood noticed in his diarrhea. Or at least red stuff. The patient does not have a history of GI bleed according to family and he has no other concerns.They do not know if it was blood or if it were blood whether it came from urine or his stool. Unknown if the patient has had a colonoscopy recently.  Past Medical History  Diagnosis Date  . Arrhythmia     pvcs  . Hyperlipidemia   . Cancer ([place])     bladder  . BPH (benign prostatic hyperplasia)   . Hypertension   . PONV (postoperative nausea and vomiting)   . Glaucoma   . Fracture of right talus     diagonosed 12/22/13- last f/u note on chart dated 01/02/14.     Patient Active Problem List   Diagnosis Date Noted  . Encounter for wound re-check 11/02/2015  . Fall with injury to face 10/26/2015  . Cataracts, bilateral 10/01/2015  . Essential hypertension 08/27/2015  . Dementia 08/27/2015  . Bladder cancer  ([place]) 11/17/2013  . Gross hematuria 11/17/2013  . Dyslipidemia 06/09/2011  . PVC (premature ventricular contraction) 06/09/2011    Past Surgical History  Procedure Laterality Date  . Bladder surgery  4 or 5 years ago    "cut bladder cancer out"  . Tonsillectomy and adenoidectomy  as child  . Cystoscopy/retrograde/ureteroscopy Bilateral 11/17/2013    Procedure: CYSTOSCOPY, bilateral /RETROGRADE/ right URETEROSCOPY, right stent, bladder fulgeration;  Surgeon: Bernestine Amass, MD;  Location: [facility];  Service: Urology;  Laterality: Bilateral;  . Cystoscopy with retrograde pyelogram, ureteroscopy and stent placement Bilateral 01/12/2014    Procedure: CYSTOSCOPY WITH BILATERAL RETROGRADE PYELOGRAM/URETERAL STENT REMOVAL, URETEROSCOPY, LEFT  AND  BLADDER BIOPSY;  Surgeon: Bernestine Amass, MD;  Location: [facility];  Service: Urology;  Laterality: Bilateral;    Current Outpatient Rx  Name  Route  Sig  Dispense  Refill  . brinzolamide (AZOPT) 1 % ophthalmic suspension   Both Eyes   Place 1 drop into both eyes 2 (two) times daily.         [name] doxycycline (VIBRA-TABS) 100 MG tablet   Oral   Take 1 tablet (100 mg total) by mouth 2 (two) times daily.   14 tablet   0   . dutasteride (AVODART) 0.5 MG capsule   Oral   Take 0.5 mg by mouth daily.          [name]  latanoprost (XALATAN) 0.005 % ophthalmic solution   Both Eyes   Place 1 drop into both eyes at bedtime.         [name] lisinopril (PRINIVIL,ZESTRIL) 20 MG tablet   Oral   Take 0.5 tablets (10 mg total) by mouth daily.   90 tablet   3     Allergies Review of patient's allergies indicates no known allergies.  Family History  Problem Relation Age of Onset  . Heart attack Father 72    deceased from mi  . Hypertension Father   . Stroke Father   . Breast cancer Mother 33    breast cancer  . Cancer Mother     Social History Social History  Substance Use Topics  . Smoking status: Never Smoker   . Smokeless tobacco: Never Used  .  Alcohol Use: No     Comment: rare    Review of Systems Constitutional: No fever/chills Eyes: No visual changes. ENT: No sore throat. No stiff neck no neck pain Cardiovascular: Denies chest pain. Respiratory: Denies shortness of breath. Gastrointestinal:   no vomiting.  No diarrhea.  No constipation. Genitourinary: Negative for dysuria. Musculoskeletal: Negative lower extremity swelling Skin: Negative for rash. Neurological: Negative for headaches, focal weakness or numbness. 10-point ROS otherwise negative.  ____________________________________________   PHYSICAL EXAM:  VITAL SIGNS: ED Triage Vitals  Enc Vitals Group     BP 11/15/15 1303 152/12 mmHg     Pulse Rate 11/15/15 1303 71     Resp 11/15/15 1303 20     Temp 11/15/15 1303 98.2 F (36.8 C)     Temp Source 11/15/15 1303 Oral     SpO2 11/15/15 1303 93 %     Weight 11/15/15 1303 162 lb (73.483 kg)     Height 11/15/15 1303 5\' 6"  (1.676 m)     Head Cir --      Peak Flow --      Pain Score 11/15/15 1304 0     Pain Loc --      Pain Edu? --      Excl. in [place]? --     Constitutional: Alert and oriented at his baseline according to family, can tell me where he is unsure of the date  Eyes: Conjunctivae are normal. PERRL. EOMI. Head: Atraumatic. Nose: No congestion/rhinnorhea. Mouth/Throat: Mucous membranes are moist.  Oropharynx non-erythematous. Neck: No stridor.   Nontender with no meningismus Cardiovascular: Normal rate, regular rhythm. Grossly normal heart sounds.  Good peripheral circulation. Respiratory: Normal respiratory effort.  No retractions. Lungs CTAB. Abdominal: Soft and nontender. No distention. No guarding no rebound Back:  There is no focal tenderness or step off there is no midline tenderness there are no lesions noted. there is no CVA tenderness Rectal exam: Guaiac negative loose brown stool  Musculoskeletal: No lower extremity tenderness. No joint effusions, no DVT signs strong distal pulses no  edema Neurologic:  Normal speech and language. No gross focal neurologic deficits are appreciated.  Skin:  Skin is warm, dry and intact. No rash noted. Psychiatric: Mood and affect are normal. Speech and behavior are normal.  ____________________________________________   LABS (all labs ordered are listed, but only abnormal results are displayed)  Labs Reviewed  COMPREHENSIVE METABOLIC PANEL - Abnormal; Notable for the following:    Calcium 8.4 (*)    AST 14 (*)    ALT 11 (*)    GFR calc non Af Amer 54 (*)    All other components within normal limits  CBC -  Abnormal; Notable for the following:    RBC 4.12 (*)    Hemoglobin 12.9 (*)    HCT 38.3 (*)    RDW 15.5 (*)    All other components within normal limits  URINALYSIS COMPLETEWITH MICROSCOPIC ([facility] ONLY) - Abnormal; Notable for the following:    Color, Urine RED (*)    APPearance CLOUDY (*)    Hgb urine dipstick 3+ (*)    Protein, ur 100 (*)    Bacteria, UA RARE (*)    All other components within normal limits  URINE CULTURE  GASTROINTESTINAL PANEL BY PCR, STOOL (REPLACES STOOL CULTURE)  C DIFFICILE QUICK SCREEN W PCR REFLEX  LIPASE, BLOOD   ____________________________________________  EKG  I personally interpreted any EKGs ordered by me or triage  ____________________________________________  RADIOLOGY  I reviewed any imaging ordered by me or triage that were performed during my shift ____________________________________________   PROCEDURES  Procedure(s) performed: None  Critical Care performed: None  ____________________________________________   INITIAL IMPRESSION / ASSESSMENT AND PLAN / ED COURSE  Pertinent labs & imaging results that were available during my care of the patient were reviewed by me and considered in my medical decision making (see chart for details).  The patient was rolled in for several GI bleed. He is not having a GI bleed. Blood work and vital signs and rectal exams which  are no evidence of acute GI bleed. It is concerning that he is having an unknown quantity of diarrhea after antibiotics. We will send stool for C. difficile as well as PCR culture. He has not have an elevated white count consistent with C. difficile. I am reluctant to start him on empiric antibiotics at this time given his age and comorbidities. There is no evidence of acute decompensation in terms of dehydration or renal failure. We will give him IV fluids as a precaution. He does have a doctor he can follow up with very closely. His culture will come back after discharge. Patient is not vomiting and he is able to stay well-hydrated. I have asked to see if we can get social work to evaluate the patient for possible home health although he refuses in the past. he is nontoxic in appearance with reassuring vitals. Abdominal exam is benign.  ----------------------------------------- 5:46 PM on 11/15/2015 -----------------------------------------  Unable to contact primary care doctor thus far. Patient is having some minimal skin breakdown from under cleaned feces. Patient may need admission. ____________________________________________   FINAL CLINICAL IMPRESSION(S) / ED DIAGNOSES  Final diagnoses:  None      Schuyler Amor, MD 11/15/15 [place], MD 11/15/15 [phone]

## 2015-11-16 ENCOUNTER — Inpatient Hospital Stay: Payer: Medicare Other

## 2015-11-16 DIAGNOSIS — R197 Diarrhea, unspecified: Secondary | ICD-10-CM | POA: Diagnosis not present

## 2015-11-16 LAB — BASIC METABOLIC PANEL
ANION GAP: 6 (ref 5–15)
BUN: 17 mg/dL (ref 6–20)
CALCIUM: 8 mg/dL — AB (ref 8.9–10.3)
CO2: 22 mmol/L (ref 22–32)
Chloride: 113 mmol/L — ABNORMAL HIGH (ref 101–111)
Creatinine, Ser: 1.12 mg/dL (ref 0.61–1.24)
GFR, EST NON AFRICAN AMERICAN: 56 mL/min — AB (ref >60)
Glucose, Bld: 101 mg/dL — ABNORMAL HIGH (ref 65–99)
POTASSIUM: 3.9 mmol/L (ref 3.5–5.1)
Sodium: 141 mmol/L (ref 135–145)

## 2015-11-16 LAB — CK: Total CK: 35 U/L — ABNORMAL LOW (ref 49–397)

## 2015-11-16 MED ORDER — HALOPERIDOL LACTATE 5 MG/ML IJ SOLN: 5.0000 mg | Freq: Once | INTRAMUSCULAR | Status: DC

## 2015-11-16 MED ORDER — HALOPERIDOL LACTATE 5 MG/ML IJ SOLN
1.0000 mg | Freq: Four times a day (QID) | INTRAMUSCULAR | Status: DC | PRN
  Administered 2015-11-19 – 2015-11-21 (×3): 1 mg via INTRAVENOUS
  Filled 2015-11-16 (×4): qty 1

## 2015-11-16 MED ORDER — LORAZEPAM 2 MG/ML IJ SOLN: 1.0000 mg | Freq: Once | INTRAMUSCULAR | Status: DC

## 2015-11-16 MED ORDER — LOPERAMIDE HCL 2 MG PO CAPS
2.0000 mg | ORAL_CAPSULE | Freq: Four times a day (QID) | ORAL | Status: DC | PRN
Start: 2015-11-16 — End: 2015-11-22

## 2015-11-16 NOTE — Progress Notes (Addendum)
[place] [facility] [place] NAME: [name]    MR#:  [id number]  DATE OF BIRTH:  1922-12-28  SUBJECTIVE:  CHIEF COMPLAINT:   Chief Complaint  Patient presents with  . Diarrhea   Patient is a 82 year old Caucasian male with past medical history significant for history of arrhythmias, hyperlipidemia, cancer, BPH, hypertension who presents to the hospital with complaints of diarrhea for the past one month, he will be also had poor oral intake. No fevers were documented. In emergency room, he was found to be dehydrated and had pyuria, concerning for urinary tract infection. While in the hospital patient was noted to have right thigh mass, ultrasound revealed septated mass of unclear etiology, MRI of thigh is being performed to further delineate the mass.  Review of Systems  Unable to perform ROS: dementia    VITAL SIGNS: Blood pressure 134/69, pulse 66, temperature 98.3 F (36.8 C), temperature source Oral, resp. rate 18, height 5\' 6"  (1.676 m), weight 70.398 kg (155 lb 3.2 oz), SpO2 97 %.  PHYSICAL EXAMINATION:   GENERAL:  82 y.o.-year-old patient lying in the bed with no acute distress. Dry oral mucosa EYES: Pupils equal, round, reactive to light and accommodation. No scleral icterus. Extraocular muscles intact.  HEENT: Head atraumatic, normocephalic. Oropharynx and nasopharynx clear.  NECK:  Supple, no jugular venous distention. No thyroid enlargement, no tenderness.  LUNGS: Normal breath sounds bilaterally, no wheezing, rales,rhonchi or crepitation. No use of accessory muscles of respiration.  CARDIOVASCULAR: S1, S2 normal. No murmurs, rubs, or gallops.  ABDOMEN: Soft, mild discomfort in suprapubic area on palpation, nondistended. Bowel sounds present. No organomegaly or mass.  EXTREMITIES: No pedal edema, cyanosis, or clubbing. Right thigh medial area mass extending along the major muscle groups of approximately 10 inches over 5 inches over  4 inches in size, firm to palpation but not fluctuating. No overlying skin abnormalities, redness and inflammation of significant pain on palpation NEUROLOGIC: Cranial nerves II through XII are intact. Muscle strength 5/5 in all extremities. Sensation intact. Gait not checked.  PSYCHIATRIC: The patient is alert and disoriented, also hard of hearing SKIN: No obvious rash, lesion, or ulcer.   ORDERS/RESULTS REVIEWED:   CBC  Recent Labs Lab 11/15/15 1322  WBC 8.6  HGB 12.9*  HCT 38.3*  PLT 228  MCV 92.9  MCH 31.2  MCHC 33.6  RDW 15.5*   ------------------------------------------------------------------------------------------------------------------  Chemistries   Recent Labs Lab 11/15/15 1322 11/16/15 0556  NA 141 141  K 3.6 3.9  CL 109 113*  CO2 23 22  GLUCOSE 99 101*  BUN 17 17  CREATININE 1.15 1.12  CALCIUM 8.4* 8.0*  AST 14*  --   ALT 11*  --   ALKPHOS 59  --   BILITOT 1.2  --    ------------------------------------------------------------------------------------------------------------------ estimated creatinine clearance is 39.6 mL/min (by C-G formula based on Cr of 1.12). ------------------------------------------------------------------------------------------------------------------ No results for input(s): TSH, T4TOTAL, T3FREE, THYROIDAB in the last 72 hours.  Invalid input(s): FREET3  Cardiac Enzymes No results for input(s): CKMB, TROPONINI, MYOGLOBIN in the last 168 hours.  Invalid input(s): CK ------------------------------------------------------------------------------------------------------------------ Invalid input(s): POCBNP ---------------------------------------------------------------------------------------------------------------  RADIOLOGY: US Venous Img Lower Unilateral Right  11/16/2015  CLINICAL DATA:  Right lower extremity edema. History of bladder cancer. Evaluate for DVT. EXAM: RIGHT LOWER EXTREMITY VENOUS DOPPLER ULTRASOUND  TECHNIQUE: Gray-scale sonography with graded compression, as well as color Doppler and duplex ultrasound were performed to evaluate the lower extremity deep venous systems from the  level of the common femoral vein and including the common femoral, femoral, profunda femoral, popliteal and calf veins including the posterior tibial, peroneal and gastrocnemius veins when visible. The superficial great saphenous vein was also interrogated. Spectral Doppler was utilized to evaluate flow at rest and with distal augmentation maneuvers in the common femoral, femoral and popliteal veins. COMPARISON:  None. FINDINGS: Contralateral Common Femoral Vein: Respiratory phasicity is normal and symmetric with the symptomatic side. No evidence of thrombus. Normal compressibility. Common Femoral Vein: No evidence of thrombus. Normal compressibility, respiratory phasicity and response to augmentation. Saphenofemoral Junction: No evidence of thrombus. Normal compressibility and flow on color Doppler imaging. Profunda Femoral Vein: No evidence of thrombus. Normal compressibility and flow on color Doppler imaging. Femoral Vein: No evidence of thrombus. Normal compressibility, respiratory phasicity and response to augmentation. Popliteal Vein: No evidence of thrombus. Normal compressibility, respiratory phasicity and response to augmentation. Calf Veins: No evidence of thrombus. Normal compressibility and flow on color Doppler imaging. Superficial Great Saphenous Vein: No evidence of thrombus. Normal compressibility and flow on color Doppler imaging. Venous Reflux:  None. Other Findings: There is a slightly ill-defined approximately 7.9 x 2.2 x 5.5 cm lesion apparently within the musculature of the right thigh. The caudal aspect of this structure demonstrates internal echogenicity similar to the surrounding fat and may represent a lipoma, however there are several complex components within its cranial aspect with dominant partially shadowing  solid component measuring approximately 1.9 x 1.7 x 1.6 cm (images 42 and 44) and an adjacent complex cystic appearing component measuring approximately 1.4 x 1.1 x 1.8 cm (images 38 and 40). IMPRESSION: 1. No evidence of DVT within the right lower extremity. 2. Indeterminate complex approximately 7.9 cm partially cystic structure within the musculature of the right thigh. Differential considerations remain broad and include (but are not limited to) an atypical lipoma, soft tissue sarcoma, metastatic disease, abscess and evolving hematoma. Clinical correlation is advised. Further evaluation with MRI could be performed as clinically indicated. These results will be called to the ordering clinician or representative by the Radiologist Assistant, and communication documented in the PACS or zVision Dashboard. Electronically Signed   By: Sandi Mariscal M.D.   On: 11/16/2015 10:53    EKG:  Orders placed or performed in visit on 11/27/14  . EKG 12-Lead    ASSESSMENT AND PLAN:  Active Problems:   UTI (lower urinary tract infection) 1. Diarrhea of unclear etiology, C. difficile negative, initiate patient on Imodium as needed and get gastroenterologist involved for further recommendations 2. Failure to thrive adult, initiate Calorie count 3. Pyuria, suspected urinary tract infection, patient was initiated on Rocephin intravenously, awaiting for culture results 4. Anemia. Get Hemoccult 5. Generalized weakness, with physical therapist involved for recommendations 6. Right thigh mass of unclear etiology, getting MRI to further delineate the mass, may need a biopsy 7. Dementia, in my opinion, patient does not have capacity to make decisions for medical care, recommend establish power of attorney for medical care   Management plans discussed with the patient, family and they are in agreement.   DRUG ALLERGIES: No Known Allergies  CODE STATUS:     Code Status Orders        Start     Ordered   11/15/15  2102  Full code   Continuous     11/15/15 2102    Code Status History    Date Active Date Inactive Code Status Order ID Comments User Context   This patient has a  current code status but no historical code status.      TOTAL TIME TAKING CARE OF THIS PATIENT: 45 minutes.   Prolonged discussion this patient's family about his care, treatment plan and discharge planning, discussed for 15 minutes   Makylah Bossard M.D on 11/16/2015 at 4:00 PM  Between 7am to 6pm - Pager - [phone]  After 6pm go to [email or web address] - password [email or web address] [facility]  [facility]  Office  [phone]  CC: Primary care physician; [name], MD

## 2015-11-16 NOTE — Evaluation (Signed)
Physical Therapy Evaluation Patient Details Name: [name] MRN: [id number] DOB: 20-Jun-1923 Today's Date: 11/16/2015   History of Present Illness  [name] is a 82 y.o. male with a known history of hyperlipidemia, bladder cancer status post treatment, BPH, hypertension, glaucoma comes to the emergency room from home accompanied by his stepdaughter. Patient lives alone and is looked up on by daughter is visiting on daily basis. He has been having diarrheal stools for approximately 2 weeks. Patient has was covered in dried feces and was in found in filthy condition by daughter. She is had poor by mouth intake. No fever. The emergency room patient is hemodynamically stable however he was found to be clinically dehydrated and found to have UTI and significant diarrhea is being admitted for further evaluation and management.  Clinical Impression  Pt demonstrates poor cognition and is AOx1 during evaluation. Pt with poor insight and judgement. Unable to identify "911" as emergency number. Step-daughter reports concern about patient's ability to make safe decisions. Pt demonstrates ambulation with supervision only without evidence for gait instability. Slight decrease in step length but appropriate for 47 year-old. Discussed concerns about safety with decision making/cognition at home. Discussed need for increased care and possibility of ALF placement however pt does not have HCPOA. Per step-daughter requested discussed these concerns with CSW and care management. Discussed med alert bracelet but pt is not cognitively intact enough to utilize if he had a fall. Offered [facility] PT services for balance and strength as well as home safety however pt refuses. Will keep pt on caseload to maintain strength, balance, and conditioning while admitted. Pt will benefit from skilled PT services to address deficits in strength, balance, and mobility in order to return to full function at home.     Follow Up Recommendations  Home health PT;Supervision/Assistance - 24 hour (Pt refuses, please do not arrange)    Equipment Recommendations  None recommended by PT    Recommendations for Other Services       Precautions / Restrictions Precautions Precautions: Fall Restrictions Weight Bearing Restrictions: No      Mobility  Bed Mobility Overal bed mobility: Independent             General bed mobility comments: Good speed and sequencing noted  Transfers Overall transfer level: Needs assistance Equipment used: None Transfers: Sit to/from Stand Sit to Stand: Supervision         General transfer comment: Good strength and stability with sit to stand transfers. No assistive device required.   Ambulation/Gait Ambulation/Gait assistance: Supervision Ambulation Distance (Feet): 275 Feet Assistive device: None Gait Pattern/deviations: Step-through pattern   Gait velocity interpretation: >2.62 ft/sec, indicative of independent community ambulator General Gait Details: Pt demonstrates good step length and gait speed during ambulation. Able to perform horizontal and vertical head turns as well as speed changes without evidence for instability. Vitals remain WNL throughout ambulation distance. No assistive device required for ambulation. Pt able to maintain static standing balance without UE support with bathroom break to urinate in commode.  Stairs            Wheelchair Mobility    Modified Rankin (Stroke Patients Only)       Balance Overall balance assessment: Needs assistance   Sitting balance-Leahy Scale: Normal       Standing balance-Leahy Scale: Fair Standing balance comment: Negative [place]. Single leg balance 2 seconds. Good balance with head turns during ambualtion as well as gait speed changes  Pertinent Vitals/Pain Pain Assessment: No/denies pain    Home Living Family/patient expects to be discharged to:: Unsure Living  Arrangements: Alone Available Help at Discharge: Family Type of Home: House Home Access: Stairs to enter Entrance Stairs-Rails: Right Entrance Stairs-Number of Steps: 4 Home Layout: Able to live on main level with bedroom/bathroom;Laundry or work area in [place]: Environmental consultant - standard;Cane - single point      Prior Function Level of Independence: Independent         Comments: Assist with IADLs. Step-daughter states that pt is not caring for himself well. She does not believe he bathes himself     Hand Dominance   Dominant Hand: Right    Extremity/Trunk Assessment   Upper Extremity Assessment: Overall WFL for tasks assessed           Lower Extremity Assessment: Overall WFL for tasks assessed         Communication   Communication: No difficulties  Cognition Arousal/Alertness: Awake/alert Behavior During Therapy: WFL for tasks assessed/performed Overall Cognitive Status: History of cognitive impairments - at baseline (AOx1 at time of evaluation. Baseline confusion, mildly worse)                      General Comments      Exercises        Assessment/Plan    PT Assessment Patient needs continued PT services  PT Diagnosis Generalized weakness   PT Problem List Decreased strength;Decreased balance;Decreased safety awareness  PT Treatment Interventions Gait training;Stair training;Therapeutic activities;Therapeutic exercise;Balance training;Neuromuscular re-education;Cognitive remediation;Patient/family education   PT Goals (Current goals can be found in the Care Plan section) Acute Rehab PT Goals Patient Stated Goal: Return home PT Goal Formulation: With patient/family Time For Goal Achievement: 11/30/15 Potential to Achieve Goals: Good    Frequency Min 2X/week   Barriers to discharge Decreased caregiver support Pt lives alone    Co-evaluation               End of Session Equipment Utilized During Treatment: Gait  belt Activity Tolerance: Patient tolerated treatment well Patient left: in bed;with call bell/phone within reach;with bed alarm set;with family/visitor present Nurse Communication: Mobility status         Time: [id number] PT Time Calculation (min) (ACUTE ONLY): 27 min   Charges:   PT Evaluation $PT Eval Moderate Complexity: 1 Procedure PT Treatments $Gait Training: 8-22 mins   PT G Codes:       Lyndel Safe Huprich PT, DPT   Huprich,Jason 11/16/2015, 12:23 PM

## 2015-11-16 NOTE — Progress Notes (Signed)
PT Cancellation Note  Patient Details Name: [name] MRN: [id number] DOB: 01-Apr-1923   Cancelled Treatment:    Reason Eval/Treat Not Completed: Medical issues which prohibited therapy. Chart reviewed. Pt has pending RLE [place] to rule out DVT. Will await results prior to PT evaluation.   Lyndel Safe [place] PT, DPT   Judea Fennimore 11/16/2015, 8:43 AM

## 2015-11-16 NOTE — Plan of Care (Signed)
Problem: Safety: Goal: Ability to remain free from injury will improve Outcome: Progressing High fall precaution per policy. Ambulates with assist.  Problem: Pain Managment: Goal: General experience of comfort will improve Outcome: Progressing Tylenol given for pain with improvement.  Problem: Physical Regulation: Goal: Ability to maintain clinical measurements within normal limits will improve Outcome: Progressing Continues IV fluids. Goal: Will remain free from infection Outcome: Progressing Continues ABX.  Problem: Tissue Perfusion: Goal: Risk factors for ineffective tissue perfusion will decrease Outcome: Progressing [place] of right lower extremity scheduled this am.

## 2015-11-16 NOTE — Progress Notes (Signed)
   11/16/15 1600  Clinical Encounter Type  Visited With Patient and family together  Visit Type Initial  Referral From Nurse  Consult/Referral To Chaplain  Spiritual Encounters  Spiritual Needs Literature;Emotional  Stress Factors  Patient Stress Factors Exhausted;Lack of knowledge;Major life changes  Family Stress Factors Exhausted;Family relationships;Major life changes  Advance Directives (For Healthcare)  Does patient have an advance directive? No (Patient declined/also not lucid)  Provided AD education to patient & family. Patient not lucid and declined to complete documents. Assigned nurse notified & advised to contact chaplain if pt. Indicates a change of mind. Chap. Marcelene Weidemann G. [place]

## 2015-11-17 ENCOUNTER — Inpatient Hospital Stay: Payer: Medicare Other

## 2015-11-17 DIAGNOSIS — F0390 Unspecified dementia without behavioral disturbance: Secondary | ICD-10-CM

## 2015-11-17 DIAGNOSIS — R2241 Localized swelling, mass and lump, right lower limb: Secondary | ICD-10-CM

## 2015-11-17 DIAGNOSIS — Z8551 Personal history of malignant neoplasm of bladder: Secondary | ICD-10-CM

## 2015-11-17 DIAGNOSIS — E43 Unspecified severe protein-calorie malnutrition: Secondary | ICD-10-CM | POA: Insufficient documentation

## 2015-11-17 LAB — URINE CULTURE

## 2015-11-17 MED ORDER — TUBERCULIN PPD 5 UNIT/0.1ML ID SOLN
5.0000 [IU] | Freq: Once | INTRADERMAL | Status: AC
Start: 2015-11-17 — End: 2015-11-19
  Administered 2015-11-17: 5 [IU] via INTRADERMAL
  Filled 2015-11-17: qty 0.1

## 2015-11-17 MED ORDER — COLESTIPOL HCL 5 G PO PACK: 5.0000 g | PACK | Freq: Every day | ORAL | Status: DC

## 2015-11-17 MED ORDER — GADOBENATE DIMEGLUMINE 529 MG/ML IV SOLN
15.0000 mL | Freq: Once | INTRAVENOUS | Status: AC | PRN
  Administered 2015-11-17: 14 mL via INTRAVENOUS

## 2015-11-17 MED ORDER — DONEPEZIL HCL 5 MG PO TABS
5.0000 mg | ORAL_TABLET | Freq: Every day | ORAL | Status: DC
  Administered 2015-11-17 – 2015-11-19 (×3): 5 mg via ORAL
  Filled 2015-11-17 (×4): qty 1

## 2015-11-17 MED ORDER — ENSURE ENLIVE PO LIQD
237.0000 mL | Freq: Three times a day (TID) | ORAL | Status: DC
  Administered 2015-11-17 – 2015-11-22 (×14): 237 mL via ORAL

## 2015-11-17 MED ORDER — CHOLESTYRAMINE 4 G PO PACK
4.0000 g | PACK | Freq: Every day | ORAL | Status: DC
  Administered 2015-11-17 – 2015-11-19 (×3): 4 g via ORAL
  Filled 2015-11-17 (×4): qty 1

## 2015-11-17 NOTE — Consult Note (Signed)
Reason for Consult:Diarrhea Referring Physician: Dr. [name] is an 82 y.o. male with a month history of diarrhea.  HPI: [name] was admitted for further evaluation of multiple concerns that include diarrhea, dehydration, failure to thrive, Pyuria , Right thigh mass and Dementia. His daughter gives the history of him soiling his clothing and bathroom with diarrhea over the last 4 weeks.  No bleeding or melena noted. He received an antibiotic for cellulitis for three days that was stopped by daughter because of increasing diarrhea with poor fluid intake. No prior history of this happening in the past. No antibiotics prior to diarrhea. No sick contacts per daughter. She doesn't recall him ever having an upper or lower endoscopy.   Questran started on admission. Stools are now soft.  Stool for c-difficile is negative. No complaints of abdominal pain.  Hemoglobin 12.9/hct 38.3 a couple of days ago. Two months ago, his hemoglobin was 14.4/hct 43. Occult stool has been sent to the lab.   Past Medical History  Diagnosis Date  . Arrhythmia     pvcs  . Hyperlipidemia   . Cancer ([place])     bladder  . BPH (benign prostatic hyperplasia)   . Hypertension   . PONV (postoperative nausea and vomiting)   . Glaucoma   . Fracture of right talus     diagonosed 12/22/13- last f/u note on chart dated 01/02/14.     Past Surgical History  Procedure Laterality Date  . Bladder surgery  4 or 5 years ago    "cut bladder cancer out"  . Tonsillectomy and adenoidectomy  as child  . Cystoscopy/retrograde/ureteroscopy Bilateral 11/17/2013    Procedure: CYSTOSCOPY, bilateral /RETROGRADE/ right URETEROSCOPY, right stent, bladder fulgeration;  Surgeon: Bernestine Amass, MD;  Location: [facility];  Service: Urology;  Laterality: Bilateral;  . Cystoscopy with retrograde pyelogram, ureteroscopy and stent placement Bilateral 01/12/2014    Procedure: CYSTOSCOPY WITH BILATERAL RETROGRADE PYELOGRAM/URETERAL STENT  REMOVAL, URETEROSCOPY, LEFT  AND  BLADDER BIOPSY;  Surgeon: Bernestine Amass, MD;  Location: [facility];  Service: Urology;  Laterality: Bilateral;    Family History  Problem Relation Age of Onset  . Heart attack Father 60    deceased from mi  . Hypertension Father   . Stroke Father   . Breast cancer Mother 98    breast cancer  . Cancer Mother     Social History:  reports that he has never smoked. He has never used smokeless tobacco. He reports that he does not drink alcohol or use illicit drugs.  Allergies: No Known Allergies  Medications:  I have reviewed the patient's current medications. Prior to Admission:  Prescriptions prior to admission  Medication Sig Dispense Refill Last Dose  . brinzolamide (AZOPT) 1 % ophthalmic suspension Place 1 drop into both eyes 2 (two) times daily.   11/15/2015 at Unknown time  . dorzolamide-timolol (COSOPT) 22.3-6.8 MG/ML ophthalmic solution Place 1 drop into the left eye 2 (two) times daily.   11/15/2015 at Unknown time  . latanoprost (XALATAN) 0.005 % ophthalmic solution Place 1 drop into both eyes at bedtime.   11/14/2015 at Unknown time  . lisinopril (PRINIVIL,ZESTRIL) 20 MG tablet Take 20 mg by mouth daily.   11/15/2015 at Unknown time  . doxycycline (VIBRA-TABS) 100 MG tablet Take 1 tablet (100 mg total) by mouth 2 (two) times daily. (Patient not taking: Reported on 11/15/2015) 14 tablet 0 Taking   Scheduled: . brinzolamide  1 drop Both Eyes BID  .  cholestyramine  4 g Oral Daily  . dutasteride  0.5 mg Oral Daily  . feeding supplement (ENSURE ENLIVE)  237 mL Oral TID WC  . haloperidol lactate  5 mg Intramuscular Once  . latanoprost  1 drop Both Eyes QHS  . lisinopril  10 mg Oral Daily  . LORazepam  1 mg Intramuscular Once  . tuberculin  5 Units Intradermal Once   Continuous:  AJO:INOMVEHMCNOBS **OR** acetaminophen, haloperidol lactate, hydrALAZINE, loperamide, ondansetron **OR** ondansetron (ZOFRAN) IV, oxyCODONE Anti-infectives    Start      Dose/Rate Route Frequency Ordered Stop   11/15/15 2115  cefTRIAXone (ROCEPHIN) 1 g in dextrose 5 % 50 mL IVPB  Status:  Discontinued     1 g 100 mL/hr over 30 Minutes Intravenous Daily after supper 11/15/15 2102 11/17/15 1405      Results for orders placed or performed during the hospital encounter of 11/15/15 (from the past 48 hour(s))  Basic metabolic panel     Status: Abnormal   Collection Time: 11/16/15  5:56 AM  Result Value Ref Range   Sodium 141 135 - 145 mmol/L   Potassium 3.9 3.5 - 5.1 mmol/L   Chloride 113 (H) 101 - 111 mmol/L   CO2 22 22 - 32 mmol/L   Glucose, Bld 101 (H) 65 - 99 mg/dL   BUN 17 6 - 20 mg/dL   Creatinine, Ser 1.12 0.61 - 1.24 mg/dL   Calcium 8.0 (L) 8.9 - 10.3 mg/dL   GFR calc non Af Amer 56 (L) >60 mL/min   GFR calc Af Amer >60 >60 mL/min    Comment: (NOTE) The eGFR has been calculated using the CKD EPI equation. This calculation has not been validated in all clinical situations. eGFR's persistently <60 mL/min signify possible Chronic Kidney Disease.    Anion gap 6 5 - 15  CK     Status: Abnormal   Collection Time: 11/16/15  5:56 AM  Result Value Ref Range   Total CK 35 (L) 49 - 397 U/L    Mr Femur Right W Wo Contrast  11/17/2015  CLINICAL DATA:  Mass along the medial right thigh seen on ultrasound. EXAM: MRI OF THE RIGHT FEMUR WITHOUT AND WITH CONTRAST TECHNIQUE: Multiplanar, multisequence MR imaging of the lower right extremity was performed both before and after administration of intravenous contrast. CONTRAST:  30m MULTIHANCE GADOBENATE DIMEGLUMINE 529 MG/ML IV SOLN COMPARISON:  11/16/2015 FINDINGS: 28.2 by 13.8 by 10.6 cm fatty mass primarily of the left posterior thigh is present extending from about the level of the knee joint cephalad. This mass completely surrounds the gracilis muscle, extends posterior and and lateral to the sartorius, envelops the combined semimembranosus and semitendinosus, anteriorly displaces and flattens the adductor  magnus, and laterally displaces the biceps femoris. The sciatic nerve is displaced laterally along the margin of this mass and partially surrounded by the mass. Closer to the knee, the mass abuts the distal superficial femoral vessels and popliteal vessels. The mass has internal septations with high T2 signal and septal enhancement internally. I do not observe any definite internal enhancing nodules ; a internal low T1 signal nodule measuring 1.0 by 0.6 cm on image 19 series 3 along the anterior portion of the lesion probably represents a calcification, and several similar tiny nodules are present posteriorly along the upper margin of the lesion, but these do not appreciably enhance. No underlying bony abnormality observed. IMPRESSION: 1. Large fatty mass infiltrating the medial and posterior compartments of the thigh, measuring  28.2 cm in length, and surrounding and displacing multiple structures as noted above. This mass abuts the distal superficial femoral vasculature and sciatic nerve. The mass has the enhancing septal elements and some punctate low signal intensity nonenhancing elements which may be calcifications. Top differential diagnostic considerations include liposarcoma and lipoma. Soft tissue hemangioma is a possibility although the degree of fatty replacement is greater than is typically seen in hemangioma. Electronically Signed   By: Van Clines M.D.   On: 11/17/2015 12:46   US Venous Img Lower Unilateral Right  11/16/2015  CLINICAL DATA:  Right lower extremity edema. History of bladder cancer. Evaluate for DVT. EXAM: RIGHT LOWER EXTREMITY VENOUS DOPPLER ULTRASOUND TECHNIQUE: Gray-scale sonography with graded compression, as well as color Doppler and duplex ultrasound were performed to evaluate the lower extremity deep venous systems from the level of the common femoral vein and including the common femoral, femoral, profunda femoral, popliteal and calf veins including the posterior tibial,  peroneal and gastrocnemius veins when visible. The superficial great saphenous vein was also interrogated. Spectral Doppler was utilized to evaluate flow at rest and with distal augmentation maneuvers in the common femoral, femoral and popliteal veins. COMPARISON:  None. FINDINGS: Contralateral Common Femoral Vein: Respiratory phasicity is normal and symmetric with the symptomatic side. No evidence of thrombus. Normal compressibility. Common Femoral Vein: No evidence of thrombus. Normal compressibility, respiratory phasicity and response to augmentation. Saphenofemoral Junction: No evidence of thrombus. Normal compressibility and flow on color Doppler imaging. Profunda Femoral Vein: No evidence of thrombus. Normal compressibility and flow on color Doppler imaging. Femoral Vein: No evidence of thrombus. Normal compressibility, respiratory phasicity and response to augmentation. Popliteal Vein: No evidence of thrombus. Normal compressibility, respiratory phasicity and response to augmentation. Calf Veins: No evidence of thrombus. Normal compressibility and flow on color Doppler imaging. Superficial Great Saphenous Vein: No evidence of thrombus. Normal compressibility and flow on color Doppler imaging. Venous Reflux:  None. Other Findings: There is a slightly ill-defined approximately 7.9 x 2.2 x 5.5 cm lesion apparently within the musculature of the right thigh. The caudal aspect of this structure demonstrates internal echogenicity similar to the surrounding fat and may represent a lipoma, however there are several complex components within its cranial aspect with dominant partially shadowing solid component measuring approximately 1.9 x 1.7 x 1.6 cm (images 42 and 44) and an adjacent complex cystic appearing component measuring approximately 1.4 x 1.1 x 1.8 cm (images 38 and 40). IMPRESSION: 1. No evidence of DVT within the right lower extremity. 2. Indeterminate complex approximately 7.9 cm partially cystic  structure within the musculature of the right thigh. Differential considerations remain broad and include (but are not limited to) an atypical lipoma, soft tissue sarcoma, metastatic disease, abscess and evolving hematoma. Clinical correlation is advised. Further evaluation with MRI could be performed as clinically indicated. These results will be called to the ordering clinician or representative by the Radiologist Assistant, and communication documented in the PACS or zVision Dashboard. Electronically Signed   By: Sandi Mariscal M.D.   On: 11/16/2015 10:53    Review of Systems  Unable to perform ROS: dementia   Blood pressure 133/75, pulse 87, temperature 98.7 F (37.1 C), temperature source Oral, resp. rate 20, height _0  (1.676 m), weight 70.398 kg (155 lb 3.2 oz), SpO2 100 %. Physical Exam  Nursing note and vitals reviewed. Constitutional: No distress.  Thin.   HENT:  Head: Normocephalic and atraumatic.  Eyes: No scleral icterus.  Neck: Normal range of  motion. Neck supple. No JVD present. No tracheal deviation present. No thyromegaly present.  Cardiovascular: Normal rate, regular rhythm, normal heart sounds and intact distal pulses.  Exam reveals no gallop and no friction rub.   No murmur heard. Respiratory: Effort normal and breath sounds normal. No stridor.  GI: Soft. Bowel sounds are normal. He exhibits no distension and no mass. There is no tenderness. There is no rebound and no guarding.  Musculoskeletal: He exhibits no edema or tenderness.  Lymphadenopathy:    He has no cervical adenopathy.  Neurological: He is alert.  Oriented name. Hard of hearing.   Skin: Skin is warm and dry. No rash noted. He is not diaphoretic. No erythema. No pallor.    Assessment/Plan: Diarrhea: Etiology not known. Negative C-diff. Resolved with Questran. Will follow.  Anemia: Slightly low at 12.9 gm/dl. Occult testing pending.  Will follow Repeat hgb that has been ordered. Recommendations to follow.     Patient seen in collaboration with Dr. Verdie Shire. Thank you for consult.   [name]-FNP-BC 11/17/2015, 5:50 PM

## 2015-11-17 NOTE — Clinical Social Work Note (Signed)
Clinical Social Work Assessment  Patient Details  Name: [name] MRN: [id number] Date of Birth: 04-Sep-1923  Date of referral:  11/17/15               Reason for consult:  Facility Placement                Permission sought to share information with:  Family Supports Permission granted to share information::  Yes, Verbal Permission Granted  Name::     Army [place], step daughter;  [phone]   Housing/Transportation Living arrangements for the past 2 months:  [place] of Information:  Adult Children Patient Interpreter Needed:  None Criminal Activity/Legal Involvement Pertinent to Current Situation/Hospitalization:  No - Comment as needed Significant Relationships:  Adult Children Lives with:  Self Do you feel safe going back to the place where you live?  No Need for family participation in patient care:  Yes (Comment)  Care giving concerns:  Pt lives alone and is unable to care for himself.    Social Worker assessment / plan:  CSW met with pt and stepdaughter, Opal Sidles, to address consult for placement. CSW introduced herself and explained role of social work. Per PT, pt is not a rehab candidate and is recommending home health, however pt refused. Psych consult is pending for capacity. Pt lives alone, per stepdaughter. Pt does not have any blood children, but stepchildren are involved. Pt is unable to care for himself in the home and does not eat. Pt's stepdaughter brings over meals. Per stepdaughter, the stove has been unplugged for safety concerns. Pt's stepdaughter manages pt's finances and pt could cover ALF placement. CSW explained the process of discharging to an ALF. CSW provided resources for [place], but stepdaughter shared that pt does not qualify for Medicaid, SNF, for future reference, ALF for placement, Sitter list, should pt return home. CSW provided information for the [place], if the family chooses to petition  for guardianship. CSW included [facility] and Dementia Services. CSW initiated a ALF search and will follow up with bed offers. CSW will continue to follow.   Employment status:  Retired Forensic scientist:  Information systems manager PT Recommendations:  Home with [place], [place] / Referral to community resources:  Support Groups, Other (Comment Required), [place] ([place], [place], ALF, Advertising copywriter, [facility]  [facility], Dementia Services)  Patient/Family's Response to care:  Pt's stepdaughter was appreciative of CSW support and resources.  Patient/Family's Understanding of and Emotional Response to Diagnosis, Current Treatment, and Prognosis:  Pt's stepdaughter would like for pt to be placed, however in the past pt has refused home health. Waiting for psych consult for determination for capacity and then to discharge plan accordingly.   Emotional Assessment Appearance:  Appears stated age, Disheveled Attitude/Demeanor/Rapport:  Guarded Affect (typically observed):  Agitated Orientation:  Oriented to Self, Fluctuating Orientation (Suspected and/or reported Sundowners) Alcohol / Substance use:  Never Used Psych involvement (Current and /or in the community):  Yes (Comment) (Consult for Capacity)  Discharge Needs  Concerns to be addressed:  Patient refuses services, Adjustment to Illness, Basic Needs, Decision making concerns, Cognitive Concerns, Home Safety Concerns Readmission within the last 30 days:  No Current discharge risk:  Lives alone Barriers to Discharge:  Continued Medical Work up   [employer], LCSW 11/17/2015, 11:34 AM

## 2015-11-17 NOTE — Clinical Social Work Placement (Signed)
   CLINICAL SOCIAL WORK PLACEMENT  NOTE  Date:  11/17/2015  Patient Details  Name: [name] MRN: [id number] Date of Birth: April 26, 1923  Clinical Social Work is seeking post-discharge placement for this patient at the [place] level of care (*CSW will initial, date and re-position this form in  chart as items are completed):  Yes   Patient/family provided with [place] Work Department's list of facilities offering this level of care within the geographic area requested by the patient (or if unable, by the patient's family).  Yes   Patient/family informed of their freedom to choose among providers that offer the needed level of care, that participate in Medicare, Medicaid or managed care program needed by the patient, have an available bed and are willing to accept the patient.  No (N/A, ALF)   Patient/family informed of [facility]'s ownership interest in [facility] and [facility], as well as of the fact that they are under no obligation to receive care at these facilities.  PASRR submitted to EDS on       PASRR number received on       Existing PASRR number confirmed on       FL2 transmitted to all facilities in geographic area requested by pt/family on 11/17/15     FL2 transmitted to all facilities within larger geographic area on       Patient informed that his/her managed care company has contracts with or will negotiate with certain facilities, including the following:            Patient/family informed of bed offers received.  Patient chooses bed at       Physician recommends and patient chooses bed at      Patient to be transferred to   on  .  Patient to be transferred to facility by       Patient family notified on   of transfer.  Name of family member notified:        PHYSICIAN       Additional Comment:    _______________________________________________ [place] Dates, LCSW 11/17/2015, 12:10 PM

## 2015-11-17 NOTE — Plan of Care (Signed)
Problem: Education: Goal: Knowledge of [place] General Education information/materials will improve Outcome: Progressing Pt is alert to self, confused. Pt is combative at times when he does not want to have anyone touch him. Pt refused all night time medications. Pt is impulsive, gets up to the bathroom by himself, with an unsteady gait, and refuses to let anyone in the bathroom with him. No signs or symptoms of pain at this time. Urine continues to be pink in color.  Problem: Safety: Goal: Ability to remain free from injury will improve Outcome: Progressing Pt is a high fall risk, sitter at the bedside.

## 2015-11-17 NOTE — Consult Note (Addendum)
[facility] NOTE  Patient Care Team: Leone Haven, MD as PCP - General (Family Medicine)  CHIEF COMPLAINTS/PURPOSE OF CONSULTATION: Right medial thigh mass concerning for malignancy  HISTORY OF PRESENTING ILLNESS: Please note patient is a poor historian/dementia. Hence history is taken talking to the patient's stepdaughter by the bedside.  [name] 82 y.o.  male with a history of moderate to severe dementia who interestingly has been living by himself; although with the help of his family- is currently brought in the hospital for severe diarrhea.   As per the daughter; while in the hospital he was noted to have a right thigh mass which was further worked up- with an MRI of the thigh- which is concerning for liposarcoma versus lipoma. Oncology has been complicated for further recommendations.  Patient unfortunately- given his dementia; he is unable to give any further history.   As per the daughter patient lives at home; however they're significantly concerned of him living by himself. He does not cook food; the daughters help him with daily chores.    ROS: Unable to to collect given his dementia.  MEDICAL HISTORY:  Past Medical History  Diagnosis Date  . Arrhythmia     pvcs  . Hyperlipidemia   . Cancer ([place])     bladder  . BPH (benign prostatic hyperplasia)   . Hypertension   . PONV (postoperative nausea and vomiting)   . Glaucoma   . Fracture of right talus     diagonosed 12/22/13- last f/u note on chart dated 01/02/14.     SURGICAL HISTORY: Past Surgical History  Procedure Laterality Date  . Bladder surgery  4 or 5 years ago    "cut bladder cancer out"  . Tonsillectomy and adenoidectomy  as child  . Cystoscopy/retrograde/ureteroscopy Bilateral 11/17/2013    Procedure: CYSTOSCOPY, bilateral /RETROGRADE/ right URETEROSCOPY, right stent, bladder fulgeration;  Surgeon: Bernestine Amass, MD;  Location: [facility];  Service: Urology;  Laterality:  Bilateral;  . Cystoscopy with retrograde pyelogram, ureteroscopy and stent placement Bilateral 01/12/2014    Procedure: CYSTOSCOPY WITH BILATERAL RETROGRADE PYELOGRAM/URETERAL STENT REMOVAL, URETEROSCOPY, LEFT  AND  BLADDER BIOPSY;  Surgeon: Bernestine Amass, MD;  Location: [facility];  Service: Urology;  Laterality: Bilateral;    SOCIAL HISTORY: Social History   Social History  . Marital Status: Widowed    Spouse Name: N/A  . Number of Children: N/A  . Years of Education: N/A   Occupational History  . Not on file.   Social History Main Topics  . Smoking status: Never Smoker   . Smokeless tobacco: Never Used  . Alcohol Use: No     Comment: rare  . Drug Use: No  . Sexual Activity: Not on file   Other Topics Concern  . Not on file   Social History Narrative    FAMILY HISTORY: Family History  Problem Relation Age of Onset  . Heart attack Father 59    deceased from mi  . Hypertension Father   . Stroke Father   . Breast cancer Mother 3    breast cancer  . Cancer Mother     ALLERGIES:  has No Known Allergies.  MEDICATIONS:  Current Facility-Administered Medications  Medication Dose Route Frequency Provider Last Rate Last Dose  . acetaminophen (TYLENOL) tablet 650 mg  650 mg Oral Q6H PRN Fritzi Mandes, MD   650 mg at 11/16/15 0107   Or  . acetaminophen (TYLENOL) suppository 650 mg  650 mg Rectal  Q6H PRN Fritzi Mandes, MD      . brinzolamide (AZOPT) 1 % ophthalmic suspension 1 drop  1 drop Both Eyes BID Fritzi Mandes, MD   1 drop at 11/17/15 [phone]  . cholestyramine (QUESTRAN) packet 4 g  4 g Oral Daily Theodoro Grist, MD      . dutasteride (AVODART) capsule 0.5 mg  0.5 mg Oral Daily Fritzi Mandes, MD   0.5 mg at 11/17/15 [id number]  . feeding supplement (ENSURE ENLIVE) (ENSURE ENLIVE) liquid 237 mL  237 mL Oral TID WC Theodoro Grist, MD   237 mL at 11/17/15 1407  . haloperidol lactate (HALDOL) injection 1 mg  1 mg Intravenous Q6H PRN Theodoro Grist, MD      . haloperidol lactate (HALDOL)  injection 5 mg  5 mg Intramuscular Once Theodoro Grist, MD   5 mg at 11/16/15 1700  . hydrALAZINE (APRESOLINE) injection 10 mg  10 mg Intravenous Q4H PRN Lance Coon, MD   10 mg at 11/15/15 2231  . latanoprost (XALATAN) 0.005 % ophthalmic solution 1 drop  1 drop Both Eyes QHS Fritzi Mandes, MD   1 drop at 11/15/15 2243  . lisinopril (PRINIVIL,ZESTRIL) tablet 10 mg  10 mg Oral Daily Fritzi Mandes, MD   10 mg at 11/17/15 [id number]  . loperamide (IMODIUM) capsule 2 mg  2 mg Oral Q6H PRN Theodoro Grist, MD      . LORazepam (ATIVAN) injection 1 mg  1 mg Intramuscular Once Lance Coon, MD   1 mg at 11/17/15 1442  . ondansetron (ZOFRAN) tablet 4 mg  4 mg Oral Q6H PRN Fritzi Mandes, MD       Or  . ondansetron (ZOFRAN) injection 4 mg  4 mg Intravenous Q6H PRN Fritzi Mandes, MD      . oxyCODONE (Oxy IR/ROXICODONE) immediate release tablet 5 mg  5 mg Oral Q4H PRN Fritzi Mandes, MD      . tuberculin injection 5 Units  5 Units Intradermal Once Theodoro Grist, MD   5 Units at 11/17/15 1409      .  PHYSICAL EXAMINATION:   Filed Vitals:   11/17/15 0600 11/17/15 1221  BP: 157/95 133/75  Pulse: 76 87  Temp: 97.3 F (36.3 C) 98.7 F (37.1 C)  Resp: 18 20   Filed Weights   11/15/15 1303 11/15/15 2057  Weight: 162 lb (73.483 kg) 155 lb 3.2 oz (70.398 kg)    GENERAL: Well-nourished well-developed; Alert, no distress and comfortable.  Accompanied by his stepdaughter. EYES: no pallor or icterus OROPHARYNX: no thrush or ulceration. NECK: supple, no masses felt LYMPH:  no palpable lymphadenopathy in the cervical, axillary or inguinal regions LUNGS: clear to auscultation and  No wheeze or crackles HEART/CVS: regular rate & rhythm and no murmurs; No lower extremity edema ABDOMEN: abdomen soft, non-tender and normal bowel sounds Musculoskeletal:no cyanosis of digits and no clubbing; right medial thigh- 5 x 7 cm mass noted. Nontender PSYCH: alert & oriented x 0-1;  NEURO: no focal motor/sensory deficits SKIN:  no rashes or  significant lesions  LABORATORY DATA:  I have reviewed the data as listed Lab Results  Component Value Date   WBC 8.6 11/15/2015   HGB 12.9* 11/15/2015   HCT 38.3* 11/15/2015   MCV 92.9 11/15/2015   PLT 228 11/15/2015    Recent Labs  08/27/15 1431 09/03/15 1348 10/01/15 1444 11/15/15 1322 11/16/15 0556  NA 141 140  141 137 141 141  K 3.9 4.0  4.0 3.8 3.6 3.9  CL 106  105  106 101 109 113*  CO2 27 24  24 26 23 22   GLUCOSE 86 95  95 102* 99 101*  BUN 17 23  23 15 17 17   CREATININE 1.55* 1.55*  1.55* 1.39 1.15 1.12  CALCIUM 8.6 8.7  8.7 8.3* 8.4* 8.0*  GFRNONAA  --   --   --  54* 56*  GFRAA  --   --   --  >60 >60  PROT 6.5 6.9  --  7.3  --   ALBUMIN 4.1 4.5  --  3.9  --   AST 10 10  --  14*  --   ALT 7 7  --  11*  --   ALKPHOS 61 64  --  59  --   BILITOT 0.6 0.7  --  1.2  --   BILIDIR  --  0.1  --   --   --     RADIOGRAPHIC STUDIES: I have personally reviewed the radiological images as listed and agreed with the findings in the report. Ct Head Wo Contrast  10/26/2015  CLINICAL DATA:  82 year old male who fell from standing. Right facial injury. Initial encounter. EXAM: CT HEAD WITHOUT CONTRAST TECHNIQUE: Contiguous axial images were obtained from the base of the skull through the vertex without intravenous contrast. COMPARISON:  Report of head CT 08/06/1997 (no images available). FINDINGS: Visualized paranasal sinuses and mastoids are clear aside from mild maxillary sinus mucosal thickening or small mucous retention cysts. Calvarium appears intact. No acute osseous abnormality identified. Visualized orbit soft tissues are within normal limits. No scalp hematoma identified. Calcified atherosclerosis at the skull base. Mild intracranial artery dolichoectasia. Cerebral volume is within normal limits for age. No midline shift, ventriculomegaly, mass effect, evidence of mass lesion, intracranial hemorrhage or evidence of cortically based acute infarction. Gray-white matter  differentiation is within normal limits throughout the brain. No suspicious intracranial vascular hyperdensity. IMPRESSION: Negative for age noncontrast CT appearance of the brain. No acute traumatic injury identified. Electronically Signed   By: Genevie Ann M.D.   On: 10/26/2015 12:47   Mr Femur Right W Wo Contrast  11/17/2015  CLINICAL DATA:  Mass along the medial right thigh seen on ultrasound. EXAM: MRI OF THE RIGHT FEMUR WITHOUT AND WITH CONTRAST TECHNIQUE: Multiplanar, multisequence MR imaging of the lower right extremity was performed both before and after administration of intravenous contrast. CONTRAST:  47mL MULTIHANCE GADOBENATE DIMEGLUMINE 529 MG/ML IV SOLN COMPARISON:  11/16/2015 FINDINGS: 28.2 by 13.8 by 10.6 cm fatty mass primarily of the left posterior thigh is present extending from about the level of the knee joint cephalad. This mass completely surrounds the gracilis muscle, extends posterior and and lateral to the sartorius, envelops the combined semimembranosus and semitendinosus, anteriorly displaces and flattens the adductor magnus, and laterally displaces the biceps femoris. The sciatic nerve is displaced laterally along the margin of this mass and partially surrounded by the mass. Closer to the knee, the mass abuts the distal superficial femoral vessels and popliteal vessels. The mass has internal septations with high T2 signal and septal enhancement internally. I do not observe any definite internal enhancing nodules ; a internal low T1 signal nodule measuring 1.0 by 0.6 cm on image 19 series 3 along the anterior portion of the lesion probably represents a calcification, and several similar tiny nodules are present posteriorly along the upper margin of the lesion, but these do not appreciably enhance. No underlying bony abnormality observed. IMPRESSION: 1. Large fatty mass infiltrating the medial  and posterior compartments of the thigh, measuring 28.2 cm in length, and surrounding and  displacing multiple structures as noted above. This mass abuts the distal superficial femoral vasculature and sciatic nerve. The mass has the enhancing septal elements and some punctate low signal intensity nonenhancing elements which may be calcifications. Top differential diagnostic considerations include liposarcoma and lipoma. Soft tissue hemangioma is a possibility although the degree of fatty replacement is greater than is typically seen in hemangioma. Electronically Signed   By: Van Clines M.D.   On: 11/17/2015 12:46   US Venous Img Lower Unilateral Right  11/16/2015  CLINICAL DATA:  Right lower extremity edema. History of bladder cancer. Evaluate for DVT. EXAM: RIGHT LOWER EXTREMITY VENOUS DOPPLER ULTRASOUND TECHNIQUE: Gray-scale sonography with graded compression, as well as color Doppler and duplex ultrasound were performed to evaluate the lower extremity deep venous systems from the level of the common femoral vein and including the common femoral, femoral, profunda femoral, popliteal and calf veins including the posterior tibial, peroneal and gastrocnemius veins when visible. The superficial great saphenous vein was also interrogated. Spectral Doppler was utilized to evaluate flow at rest and with distal augmentation maneuvers in the common femoral, femoral and popliteal veins. COMPARISON:  None. FINDINGS: Contralateral Common Femoral Vein: Respiratory phasicity is normal and symmetric with the symptomatic side. No evidence of thrombus. Normal compressibility. Common Femoral Vein: No evidence of thrombus. Normal compressibility, respiratory phasicity and response to augmentation. Saphenofemoral Junction: No evidence of thrombus. Normal compressibility and flow on color Doppler imaging. Profunda Femoral Vein: No evidence of thrombus. Normal compressibility and flow on color Doppler imaging. Femoral Vein: No evidence of thrombus. Normal compressibility, respiratory phasicity and response to  augmentation. Popliteal Vein: No evidence of thrombus. Normal compressibility, respiratory phasicity and response to augmentation. Calf Veins: No evidence of thrombus. Normal compressibility and flow on color Doppler imaging. Superficial Great Saphenous Vein: No evidence of thrombus. Normal compressibility and flow on color Doppler imaging. Venous Reflux:  None. Other Findings: There is a slightly ill-defined approximately 7.9 x 2.2 x 5.5 cm lesion apparently within the musculature of the right thigh. The caudal aspect of this structure demonstrates internal echogenicity similar to the surrounding fat and may represent a lipoma, however there are several complex components within its cranial aspect with dominant partially shadowing solid component measuring approximately 1.9 x 1.7 x 1.6 cm (images 42 and 44) and an adjacent complex cystic appearing component measuring approximately 1.4 x 1.1 x 1.8 cm (images 38 and 40). IMPRESSION: 1. No evidence of DVT within the right lower extremity. 2. Indeterminate complex approximately 7.9 cm partially cystic structure within the musculature of the right thigh. Differential considerations remain broad and include (but are not limited to) an atypical lipoma, soft tissue sarcoma, metastatic disease, abscess and evolving hematoma. Clinical correlation is advised. Further evaluation with MRI could be performed as clinically indicated. These results will be called to the ordering clinician or representative by the Radiologist Assistant, and communication documented in the PACS or [facility] Dashboard. Electronically Signed   By: Sandi Mariscal M.D.   On: 11/16/2015 10:53    ASSESSMENT & PLAN:   82 year old male patient with prior history of bladder cancer; and severe dementia- currently noted to have  # Right thigh mass- clinically based on ultrasound approximately 5 x 8 cm in size; however based on MRI measures 10 x 28 cm- differential diagnosis including liposarcoma versus  lipoma. I reviewed the images myself.  # Severe dementia/history of bladder cancer [status  post chemoradiation-Felt 6 or 7 years ago]   Recommendations:   I had a long discussion with the patient's stepdaughter by the bedside regarding the significant concern based on imaging that this is malignancy; also only biopsy can prove this. I also discussed with her given his advanced dementia/that he might not be a good candidate for any aggressive or even palliative treatments.   # However if treatments are considered- further evaluation with CT scans/biopsy would be recommended.   # We will plan to have a family meeting tomorrow evening at 4:30- with the daughter/son. And decide the next step of care.  The above plan of care was discussed with Dr. Ether Griffins.  Thank you Dr. Hewitt Blade  for allowing me to participate in the care of your pleasant patient. Please do not hesitate to contact me with questions or concerns in the interim.  All questions were answered.    Cammie Sickle, MD 11/17/2015 4:41 PM

## 2015-11-17 NOTE — Consult Note (Signed)
[facility] Face-to-Face Psychiatry Consult   Reason for Consult:  Consult for 82 year old man with multiple medical problems. Question about capacity Referring Physician:  Ether Griffins Patient Identification: [name] MRN:  [id number] Principal Diagnosis: dementia due to Alzheimer's disease  Diagnosis:   Patient Active Problem List   Diagnosis Date Noted  . Protein-calorie malnutrition, severe [E43] 11/17/2015  . UTI (lower urinary tract infection) [N39.0] 11/15/2015  . Branch retinal vein occlusion [U27.2536] 11/11/2015  . Encounter for wound re-check [Z51.89] 11/02/2015  . Fall with injury to face [W19.XXXA] 10/26/2015  . Cataract, post subcapsular polar senile [H25.049] 10/04/2015  . Cataracts, bilateral [H26.9] 10/01/2015  . Essential hypertension [I10] 08/27/2015  . Dementia [F03.90] 08/27/2015  . Primary open angle glaucoma [H40.1190] 02/17/2015  . Bladder cancer ([place]) [C67.9] 11/17/2013  . Gross hematuria [R31.0] 11/17/2013  . Cataract, nuclear sclerotic senile [H25.10] 07/31/2011  . Dyslipidemia [E78.5] 06/09/2011  . PVC (premature ventricular contraction) [I49.3] 06/09/2011    Total Time spent with patient: 1 hour  Subjective:   [name] is a 82 y.o. male patient admitted wi"I guess I'm doing okay" patient interviewed. Also interview the daughter who was present. Reviewed chart reviewed vitals and labs. This 82 year old man currently in the hospital being treated and worked up for several different medical problems. Concern raised about his capacity to make decisions. On interview today the patient was not able to tell me why he was in the hospital. He was able to tell me that he was in the hospital and was able to identify the town but he misidentified his daughter who was sitting in the room with him. He claimed that he had no medical problems whatsoever. I specifically ask him about any problems with his bowels and he denied having had any. I ask him whether any doctors had  been by to see him to talk about his medical issues while he is in the hospital and he said that he imagined that someone probably had been but he couldn't remember anything they had talked with him about. Patient denies to me that he has any ongoing medical problems. Denies that he has a primary care doctor and denies that he takes any medication at home. Patient states that his mood is feeling fine. He has no complaints about his sleep. He feels like he takes care of himself fine at home. He says that anytime he has any problems at home that his children will come by and help him out. He denies there've been any instances where he was unable to do anything or there was any danger to living by himself. Patient denied any alcohol or drug abuse. Denied any past psychiatric history.   P I interviewed the daughter separately with the patient's consent. She inform you that the patient has been living by himself for quite some time but that since last summer she and her brother have noticed a dramatic decline in his functionality. He is unable to remember things for any length of time. He is unable to follow basic directions with taking care of his food or his medication. There've been episodes of wandering away from home and injuring himself and then refusing care. All of this seemed to have gotten worse over a fairly short period of time. Daughter is concerned that the patient is not safe taking care of himself at home. She tells me what is already obvious that multiple physicians have been by and talk with them about medical problems in the hospital. There  is been concern that the patient has refused appropriate medical interventions at times in the past and seemed to not understand what he was doing.  Social history: Patient is a retired Forensic psychologist. Daughter states that he used to be extremely intelligent. He has adult children 2 of whom check in on him fairly regularly. He is widowed. Currently living by himself in  a home. Daughter has concerns about his ability to take care of himself.  Medical history: Relatively healthy for his age but still with high blood pressure cataracts and glaucoma. Has a large mass on one of his thighs and there is a concern about a possible tumor. History in the past of bladder cancer and some heart problems. He does take regular medication at home.  Substance abuse history: No history of substance abuse past or present. sychiatric History:  there is no past history of any psychiatric problems whatsoever. No history of depression no history of suicide attempts no history of hospitalization. Self: Is patient at risk for suicide?: No Risk to Others:   Prior Inpatient Therapy:   Prior Outpatient Therapy:    Past Medical History:  Past Medical History  Diagnosis Date  . Arrhythmia     pvcs  . Hyperlipidemia   . Cancer ([place])     bladder  . BPH (benign prostatic hyperplasia)   . Hypertension   . PONV (postoperative nausea and vomiting)   . Glaucoma   . Fracture of right talus     diagonosed 12/22/13- last f/u note on chart dated 01/02/14.     Past Surgical History  Procedure Laterality Date  . Bladder surgery  4 or 5 years ago    "cut bladder cancer out"  . Tonsillectomy and adenoidectomy  as child  . Cystoscopy/retrograde/ureteroscopy Bilateral 11/17/2013    Procedure: CYSTOSCOPY, bilateral /RETROGRADE/ right URETEROSCOPY, right stent, bladder fulgeration;  Surgeon: Bernestine Amass, MD;  Location: [facility];  Service: Urology;  Laterality: Bilateral;  . Cystoscopy with retrograde pyelogram, ureteroscopy and stent placement Bilateral 01/12/2014    Procedure: CYSTOSCOPY WITH BILATERAL RETROGRADE PYELOGRAM/URETERAL STENT REMOVAL, URETEROSCOPY, LEFT  AND  BLADDER BIOPSY;  Surgeon: Bernestine Amass, MD;  Location: [facility];  Service: Urology;  Laterality: Bilateral;   Family History:  Family History  Problem Relation Age of Onset  . Heart attack Father 64    deceased from mi  .  Hypertension Father   . Stroke Father   . Breast cancer Mother 48    breast cancer  . Cancer Mother    Family Psychiatric  History: there is no known family history of mental health issues or substance abuse problemsHistory:  History  Alcohol Use No    Comment: rare     History  Drug Use No    Social History   Social History  . Marital Status: Widowed    Spouse Name: N/A  . Number of Children: N/A  . Years of Education: N/A   Social History Main Topics  . Smoking status: Never Smoker   . Smokeless tobacco: Never Used  . Alcohol Use: No     Comment: rare  . Drug Use: No  . Sexual Activity: Not Asked   Other Topics Concern  . None   Social History Narrative   Additional Social History:                          Allergies:  No Known Allergies  Labs:  Results for orders  placed or performed during the hospital encounter of 11/15/15 (from the past 48 hour(s))  Basic metabolic panel     Status: Abnormal   Collection Time: 11/16/15  5:56 AM  Result Value Ref Range   Sodium 141 135 - 145 mmol/L   Potassium 3.9 3.5 - 5.1 mmol/L   Chloride 113 (H) 101 - 111 mmol/L   CO2 22 22 - 32 mmol/L   Glucose, Bld 101 (H) 65 - 99 mg/dL   BUN 17 6 - 20 mg/dL   Creatinine, Ser 1.12 0.61 - 1.24 mg/dL   Calcium 8.0 (L) 8.9 - 10.3 mg/dL   GFR calc non Af Amer 56 (L) >60 mL/min   GFR calc Af Amer >60 >60 mL/min    Comment: (NOTE) The eGFR has been calculated using the CKD EPI equation. This calculation has not been validated in all clinical situations. eGFR's persistently <60 mL/min signify possible Chronic Kidney Disease.    Anion gap 6 5 - 15  CK     Status: Abnormal   Collection Time: 11/16/15  5:56 AM  Result Value Ref Range   Total CK 35 (L) 49 - 397 U/L    Current Facility-Administered Medications  Medication Dose Route Frequency Provider Last Rate Last Dose  . acetaminophen (TYLENOL) tablet 650 mg  650 mg Oral Q6H PRN Fritzi Mandes, MD   650 mg at 11/16/15 0107    Or  . acetaminophen (TYLENOL) suppository 650 mg  650 mg Rectal Q6H PRN Fritzi Mandes, MD      . brinzolamide (AZOPT) 1 % ophthalmic suspension 1 drop  1 drop Both Eyes BID Fritzi Mandes, MD   1 drop at 11/17/15 1931  . cholestyramine (QUESTRAN) packet 4 g  4 g Oral Daily Theodoro Grist, MD   4 g at 11/17/15 1931  . donepezil (ARICEPT) tablet 5 mg  5 mg Oral QHS Gonzella Lex, MD      . dutasteride (AVODART) capsule 0.5 mg  0.5 mg Oral Daily Fritzi Mandes, MD   0.5 mg at 11/17/15 2725  . feeding supplement (ENSURE ENLIVE) (ENSURE ENLIVE) liquid 237 mL  237 mL Oral TID WC Theodoro Grist, MD   237 mL at 11/17/15 1407  . haloperidol lactate (HALDOL) injection 1 mg  1 mg Intravenous Q6H PRN Theodoro Grist, MD      . haloperidol lactate (HALDOL) injection 5 mg  5 mg Intramuscular Once Theodoro Grist, MD   5 mg at 11/16/15 1700  . hydrALAZINE (APRESOLINE) injection 10 mg  10 mg Intravenous Q4H PRN Lance Coon, MD   10 mg at 11/15/15 2231  . latanoprost (XALATAN) 0.005 % ophthalmic solution 1 drop  1 drop Both Eyes QHS Fritzi Mandes, MD   1 drop at 11/17/15 1932  . lisinopril (PRINIVIL,ZESTRIL) tablet 10 mg  10 mg Oral Daily Fritzi Mandes, MD   10 mg at 11/17/15 3664  . loperamide (IMODIUM) capsule 2 mg  2 mg Oral Q6H PRN Theodoro Grist, MD      . LORazepam (ATIVAN) injection 1 mg  1 mg Intramuscular Once Lance Coon, MD   1 mg at 11/17/15 1442  . ondansetron (ZOFRAN) tablet 4 mg  4 mg Oral Q6H PRN Fritzi Mandes, MD       Or  . ondansetron (ZOFRAN) injection 4 mg  4 mg Intravenous Q6H PRN Fritzi Mandes, MD      . oxyCODONE (Oxy IR/ROXICODONE) immediate release tablet 5 mg  5 mg Oral Q4H PRN Fritzi Mandes, MD      .  tuberculin injection 5 Units  5 Units Intradermal Once Theodoro Grist, MD   5 Units at 11/17/15 1409    Musculoskeletal: Strength & Muscle Tone: decreased Gait & Station: unsteady Patient leans: N/A  Psychiatric Specialty Exam: Review of Systems  Constitutional: Negative.   HENT: Positive for hearing loss.    Eyes: Positive for blurred vision.  Respiratory: Negative.   Cardiovascular: Negative.   Gastrointestinal: Negative.   Musculoskeletal: Negative.   Skin: Negative.   Neurological: Negative.   Psychiatric/Behavioral: Negative for depression, suicidal ideas, hallucinations, memory loss and substance abuse. The patient is not nervous/anxious and does not have insomnia.     Blood pressure 133/75, pulse 87, temperature 98.7 F (37.1 C), temperature source Oral, resp. rate 20, height _0  (1.676 m), weight 70.398 kg (155 lb 3.2 oz), SpO2 100 %.Body mass index is 25.06 kg/(m^2).  General Appearance: Disheveled  Eye Sport and exercise psychologist::  Fair  Speech:  Slow  Volume:  Decreased  Mood:  Euthymic  Affect:  Constricted  Thought Process:  Very slow and simple and at times confused  Orientation:  Other:  Oriented to being in the hospital in [place] but not to date or time and not to the situation  Thought Content:  Negative  Suicidal Thoughts:  No  Homicidal Thoughts:  No  Memory:  Immediate;   Fair Recent;   Poor Remote;   Fair  Judgement:  Impaired  Insight:  Shallow  Psychomotor Activity:  Decreased  Concentration:  Poor  Recall:  Poor  Fund of Knowledge:Fair  Language: Fair  Akathisia:  No  Handed:  Right  AIMS (if indicated):     Assets:  Financial Resources/Insurance Housing Resilience Social Support  ADL's:  Impaired  Cognition: Impaired,  Moderate  Sleep:      Treatment Plan Summary: Medication management and Plan This is a 82 year old man who clearly is suffering from dementia most I could lead to be primarily attributable to Alzheimer's disease given his age and the type of dementia. As far as capacity the most striking thing about his mental state is his very poor short-term memory. He was unable to remember any of 3 objects at a couple minutes and had no memory of even having discuss them. Daughter reports multiple incidents that make it clear that his short-term memory is  severely impaired. Patient currently clearly has no idea of what kind of medical problems he has been having her why he is in the hospital. Given all this I think he has a lack of capacity to make appropriate medical judgments and decisions. I think it's clear that his understanding of things he is going to be impaired given that he does not even remember his basic medical issues. As far as his living situation I would also question his capacity to make decisions for living at home. Patient is either denying or has no awareness of the safety issues that have been going on at home that his daughter is able to describe in detail to me. Given that he would not be able to think clearly about placement issues. Daughter I think has a very good point that going back to living independently in his previous situation would set him up for multiple dangers and he does not appear to have any awareness of this. As far as treatment I am going to add 5 mg of Aricept as a initial treatment for Alzheimer's disease. I had a talk with the daughter for quite a while and explained all this to  her and supported her efforts to try and get her father into a safer living situation.  Disposition: Discussed crisis plan, support from social network, calling 911, coming to the Emergency Department, and calling Suicide Hotline. See note above. Patient does not appear to have adequate capacity for decision making at this time  Alethia Berthold 11/17/2015 7:53 PM

## 2015-11-17 NOTE — Care Management Important Message (Signed)
Important Message  Patient Details  Name: [name] MRN: [id number] Date of Birth: 1923-05-14   Medicare Important Message Given:  Yes    Juliann Pulse A Alexcis Bicking 11/17/2015, 11:33 AM

## 2015-11-17 NOTE — Progress Notes (Signed)
[facility] NAME: [name]    MR#:  [id number]  DATE OF BIRTH:  01-20-1923  SUBJECTIVE:  CHIEF COMPLAINT:   Chief Complaint  Patient presents with  . Diarrhea   Patient is a 82 year old Caucasian male with past medical history significant for history of arrhythmias, hyperlipidemia, cancer, BPH, hypertension who presents to the hospital with complaints of diarrhea for the past one month, he will be also had poor oral intake. No fevers were documented. In emergency room, he was found to be dehydrated and had pyuria, concerning for urinary tract infection. While in the hospital patient was noted to have right thigh mass, ultrasound revealed septated mass of unclear etiology, MRI of thigh was performed to further delineate the mass, revealing hugely mass concerning for lipoma versus liposarcoma. Oncology consultation is requested. Patient's family would like to place patient to skilled nursing facility due to concerns for his safety, falls, etc.   Review of Systems  Unable to perform ROS: dementia    VITAL SIGNS: Blood pressure 133/75, pulse 87, temperature 98.7 F (37.1 C), temperature source Oral, resp. rate 20, height 5\' 6"  (1.676 m), weight 70.398 kg (155 lb 3.2 oz), SpO2 100 %.  PHYSICAL EXAMINATION:   GENERAL:  82 y.o.-year-old patient lying in the bed with no acute distress. Comfortable, eating breakfast, no signs or symptoms of aspiration EYES: Pupils equal, round, reactive to light and accommodation. No scleral icterus. Extraocular muscles intact.  HEENT: Head atraumatic, normocephalic. Oropharynx and nasopharynx clear.  NECK:  Supple, no jugular venous distention. No thyroid enlargement, no tenderness.  LUNGS: Normal breath sounds bilaterally, no wheezing, rales,rhonchi or crepitation. No use of accessory muscles of respiration.  CARDIOVASCULAR: S1, S2 normal. No murmurs, rubs, or gallops.  ABDOMEN: Soft, mild  discomfort in suprapubic area on palpation, nondistended. Bowel sounds present. No organomegaly or mass.  EXTREMITIES: No pedal edema, cyanosis, or clubbing. Right thigh medial area mass extending along the major muscle groups of approximately 10 inches over 5 inches over 4 inches in size, firm to palpation but not fluctuating. No overlying skin abnormalities, redness and inflammation of significant pain on palpation NEUROLOGIC: Cranial nerves II through XII are intact. Muscle strength 5/5 in all extremities. Sensation intact. Gait not checked.  PSYCHIATRIC: The patient is alert and disoriented, also hard of hearing SKIN: No obvious rash, lesion, or ulcer.   ORDERS/RESULTS REVIEWED:   CBC  Recent Labs Lab 11/15/15 1322  WBC 8.6  HGB 12.9*  HCT 38.3*  PLT 228  MCV 92.9  MCH 31.2  MCHC 33.6  RDW 15.5*   ------------------------------------------------------------------------------------------------------------------  Chemistries   Recent Labs Lab 11/15/15 1322 11/16/15 0556  NA 141 141  K 3.6 3.9  CL 109 113*  CO2 23 22  GLUCOSE 99 101*  BUN 17 17  CREATININE 1.15 1.12  CALCIUM 8.4* 8.0*  AST 14*  --   ALT 11*  --   ALKPHOS 59  --   BILITOT 1.2  --    ------------------------------------------------------------------------------------------------------------------ estimated creatinine clearance is 39.6 mL/min (by C-G formula based on Cr of 1.12). ------------------------------------------------------------------------------------------------------------------ No results for input(s): TSH, T4TOTAL, T3FREE, THYROIDAB in the last 72 hours.  Invalid input(s): FREET3  Cardiac Enzymes No results for input(s): CKMB, TROPONINI, MYOGLOBIN in the last 168 hours.  Invalid input(s): CK ------------------------------------------------------------------------------------------------------------------ Invalid input(s):  POCBNP ---------------------------------------------------------------------------------------------------------------  RADIOLOGY: Mr Femur Right W Wo Contrast  11/17/2015  CLINICAL DATA:  Mass along the medial right thigh seen  on ultrasound. EXAM: MRI OF THE RIGHT FEMUR WITHOUT AND WITH CONTRAST TECHNIQUE: Multiplanar, multisequence MR imaging of the lower right extremity was performed both before and after administration of intravenous contrast. CONTRAST:  33mL MULTIHANCE GADOBENATE DIMEGLUMINE 529 MG/ML IV SOLN COMPARISON:  11/16/2015 FINDINGS: 28.2 by 13.8 by 10.6 cm fatty mass primarily of the left posterior thigh is present extending from about the level of the knee joint cephalad. This mass completely surrounds the gracilis muscle, extends posterior and and lateral to the sartorius, envelops the combined semimembranosus and semitendinosus, anteriorly displaces and flattens the adductor magnus, and laterally displaces the biceps femoris. The sciatic nerve is displaced laterally along the margin of this mass and partially surrounded by the mass. Closer to the knee, the mass abuts the distal superficial femoral vessels and popliteal vessels. The mass has internal septations with high T2 signal and septal enhancement internally. I do not observe any definite internal enhancing nodules ; a internal low T1 signal nodule measuring 1.0 by 0.6 cm on image 19 series 3 along the anterior portion of the lesion probably represents a calcification, and several similar tiny nodules are present posteriorly along the upper margin of the lesion, but these do not appreciably enhance. No underlying bony abnormality observed. IMPRESSION: 1. Large fatty mass infiltrating the medial and posterior compartments of the thigh, measuring 28.2 cm in length, and surrounding and displacing multiple structures as noted above. This mass abuts the distal superficial femoral vasculature and sciatic nerve. The mass has the enhancing septal  elements and some punctate low signal intensity nonenhancing elements which may be calcifications. Top differential diagnostic considerations include liposarcoma and lipoma. Soft tissue hemangioma is a possibility although the degree of fatty replacement is greater than is typically seen in hemangioma. Electronically Signed   By: Van Clines M.D.   On: 11/17/2015 12:46   US Venous Img Lower Unilateral Right  11/16/2015  CLINICAL DATA:  Right lower extremity edema. History of bladder cancer. Evaluate for DVT. EXAM: RIGHT LOWER EXTREMITY VENOUS DOPPLER ULTRASOUND TECHNIQUE: Gray-scale sonography with graded compression, as well as color Doppler and duplex ultrasound were performed to evaluate the lower extremity deep venous systems from the level of the common femoral vein and including the common femoral, femoral, profunda femoral, popliteal and calf veins including the posterior tibial, peroneal and gastrocnemius veins when visible. The superficial great saphenous vein was also interrogated. Spectral Doppler was utilized to evaluate flow at rest and with distal augmentation maneuvers in the common femoral, femoral and popliteal veins. COMPARISON:  None. FINDINGS: Contralateral Common Femoral Vein: Respiratory phasicity is normal and symmetric with the symptomatic side. No evidence of thrombus. Normal compressibility. Common Femoral Vein: No evidence of thrombus. Normal compressibility, respiratory phasicity and response to augmentation. Saphenofemoral Junction: No evidence of thrombus. Normal compressibility and flow on color Doppler imaging. Profunda Femoral Vein: No evidence of thrombus. Normal compressibility and flow on color Doppler imaging. Femoral Vein: No evidence of thrombus. Normal compressibility, respiratory phasicity and response to augmentation. Popliteal Vein: No evidence of thrombus. Normal compressibility, respiratory phasicity and response to augmentation. Calf Veins: No evidence of  thrombus. Normal compressibility and flow on color Doppler imaging. Superficial Great Saphenous Vein: No evidence of thrombus. Normal compressibility and flow on color Doppler imaging. Venous Reflux:  None. Other Findings: There is a slightly ill-defined approximately 7.9 x 2.2 x 5.5 cm lesion apparently within the musculature of the right thigh. The caudal aspect of this structure demonstrates internal echogenicity similar to the surrounding fat  and may represent a lipoma, however there are several complex components within its cranial aspect with dominant partially shadowing solid component measuring approximately 1.9 x 1.7 x 1.6 cm (images 42 and 44) and an adjacent complex cystic appearing component measuring approximately 1.4 x 1.1 x 1.8 cm (images 38 and 40). IMPRESSION: 1. No evidence of DVT within the right lower extremity. 2. Indeterminate complex approximately 7.9 cm partially cystic structure within the musculature of the right thigh. Differential considerations remain broad and include (but are not limited to) an atypical lipoma, soft tissue sarcoma, metastatic disease, abscess and evolving hematoma. Clinical correlation is advised. Further evaluation with MRI could be performed as clinically indicated. These results will be called to the ordering clinician or representative by the Radiologist Assistant, and communication documented in the PACS or zVision Dashboard. Electronically Signed   By: Sandi Mariscal M.D.   On: 11/16/2015 10:53    EKG:  Orders placed or performed in visit on 11/27/14  . EKG 12-Lead    ASSESSMENT AND PLAN:  Active Problems:   UTI (lower urinary tract infection)   Protein-calorie malnutrition, severe 1. Diarrhea of unclear etiology, C. difficile negative, continue patient on Imodium as needed and awaiting gastroenterologist recommendations. Adding colestipol 2. Failure to thrive adult, initiated Calorie count, pointed oral intake is relatively good to 40-100% 3. Pyuria,  likely contamination , as patient's urine culture revealed multiple species , discontinue Rocephin and follow for clinical signs of urinary tract infection 4. Anemia. Get Hemoccult, follow hemoglobin level tomorrow morning 5. Generalized weakness, physical therapist recommended home health services. However, patient refused, patient's family, however, is interested in placement to skilled nursing facility due to dementia. Getting sick arteries involved to discuss and determine patient's ability to comprehend medical issues he is faced with. Discussed this patient. Rehabilitation placement for which he was agreeable.  6. Right thigh mass of unclear etiology, concerning for lipoma versus liposarcoma per MRI , getting oncologist involved for further recommendations 7. Dementia, in my opinion, patient does not have capacity to make decisions for medical care, he also neglects himself, recommend to establish guardianship, getting psychiatrist involved for further recommendations and help.  8. Failure to thrive adult, as above, patient's family is interested in placement, getting PPD placed   Management plans discussed with the patient, family and they are in agreement.   DRUG ALLERGIES: No Known Allergies  CODE STATUS:     Code Status Orders        Start     Ordered   11/15/15 2102  Full code   Continuous     11/15/15 2102    Code Status History    Date Active Date Inactive Code Status Order ID Comments User Context   This patient has a current code status but no historical code status.      TOTAL TIME TAKING CARE OF THIS PATIENT: 35 minutes.      Theodoro Grist M.D on 11/17/2015 at 1:59 PM  Between 7am to 6pm - Pager - [phone]  After 6pm go to [email or web address] - password [email or web address] [facility]  [facility]  Office  [phone]  CC: Primary care physician; [name], MD

## 2015-11-17 NOTE — Consult Note (Signed)
  Pt seen and examined. Please see D. Martin's notes. Pt very hard of hearing. Has dementia. Diarrhea for at least few weeks. C.diff neg. Started on questran, which may be helping. Mildly anemic. If questran controls diarrhea, no plans for further w/u at this time. Consider colonoscopy later only if diarrhea persists with rectal bleeding and significant weight loss. Thanks.

## 2015-11-17 NOTE — Plan of Care (Signed)
Problem: Education: Goal: Knowledge of [facility] Education information/materials will improve Outcome: Not Progressing confused   

## 2015-11-17 NOTE — Progress Notes (Signed)
Initial Nutrition Assessment  DOCUMENTATION CODES:   Severe malnutrition in context of chronic illness  INTERVENTION:   Meals and Snacks: Cater to patient preferences Medical Food Supplement Therapy: Recommend Ensure Enlive po TID, each supplement provides 350 kcal and 20 grams of protein Coordination of Care: RD hung calorie count envelope in pt room and made RN and sitter aware of calorie count order; agreeable to document po intake. Will calculate total intake after 48hours.   NUTRITION DIAGNOSIS:   Malnutrition related to chronic illness as evidenced by percent weight loss, energy intake < or equal to 75% for > or equal to 1 month.  GOAL:   Patient will meet greater than or equal to 90% of their needs  MONITOR:    (Energy Intake, Anthropometrics, Digsetive system, Electrolyte and renal Profile)  REASON FOR ASSESSMENT:    (Calorie Count)    ASSESSMENT:   Pt admitted from home with UTI, FTT and diarrhea for the past month. Pt confused combative at times per Nsg, currently with 1:1 sitter.   Past Medical History  Diagnosis Date  . Arrhythmia     pvcs  . Hyperlipidemia   . Cancer ([place])     bladder  . BPH (benign prostatic hyperplasia)   . Hypertension   . PONV (postoperative nausea and vomiting)   . Glaucoma   . Fracture of right talus     diagonosed 12/22/13- last f/u note on chart dated 01/02/14.      Diet Order:  DIET SOFT Room service appropriate?: Yes; Fluid consistency:: Thin    Current Nutrition: Pt ate very well this am per sitter and step-daughter. Pt ate 100% of scrambled eggs, toast, home fries, peaches and water.   Food/Nutrition-Related History: Per step-daughter pt has been eating very poorly for the past month PTA. Step-daughter states the pt 'just isn't eating,' who prepares his meals and per CSW note the stove has been unplugged for safety reasons. Pt has not been drinking Ensure/Boost at home PTA. RD notes pt lives alone PTA, admitted with poor  living condition with diarrhea.   Scheduled Medications:  . brinzolamide  1 drop Both Eyes BID  . cefTRIAXone (ROCEPHIN)  IV  1 g Intravenous QPC supper  . dutasteride  0.5 mg Oral Daily  . feeding supplement (ENSURE ENLIVE)  237 mL Oral TID WC  . haloperidol lactate  5 mg Intramuscular Once  . latanoprost  1 drop Both Eyes QHS  . lisinopril  10 mg Oral Daily  . LORazepam  1 mg Intramuscular Once    Electrolyte/Renal Profile and Glucose Profile:   Recent Labs Lab 11/15/15 1322 11/16/15 0556  NA 141 141  K 3.6 3.9  CL 109 113*  CO2 23 22  BUN 17 17  CREATININE 1.15 1.12  CALCIUM 8.4* 8.0*  GLUCOSE 99 101*   Protein Profile:   Recent Labs Lab 11/15/15 1322  ALBUMIN 3.9    Gastrointestinal Profile: Last BM:  11/16/2015   Nutrition-Focused Physical Exam Findings:  Unable to complete Nutrition-Focused physical exam at this time.    Weight Change: Pt with weight loss PTA. Per [facility] weight encounters (9% weight loss in 2-3 months). Per stepdaughter pt weight of 162lbs first of this month at outpatient MD office visit (4% in 3 weeks)   Height:   Ht Readings from Last 1 Encounters:  11/15/15 5\' 6"  (1.676 m)    Weight:   Wt Readings from Last 1 Encounters:  11/15/15 155 lb 3.2 oz (70.398 kg)  Wt Readings from Last 10 Encounters:  11/15/15 155 lb 3.2 oz (70.398 kg)  11/02/15 161 lb 12.8 oz (73.392 kg)  10/29/15 163 lb 12.8 oz (74.299 kg)  10/26/15 163 lb 6.4 oz (74.118 kg)  10/01/15 172 lb 9.6 oz (78.291 kg)  08/27/15 171 lb 6.4 oz (77.747 kg)  07/28/15 169 lb 12.8 oz (77.021 kg)  11/27/14 180 lb (81.647 kg)  01/12/14 184 lb 6 oz (83.632 kg)  11/11/13 183 lb (83.008 kg)    BMI:  Body mass index is 25.06 kg/(m^2).  Estimated Nutritional Needs:   Kcal:  BEE: 1301kcals, TEE: (IF 1.1-1.3)(AF 1.2) 1718-2030kcals  Protein:  70-84g protein( 1.0-1.2g/kg)  Fluid:  1760-213mL of fluid (25-28mL/kg)  EDUCATION NEEDS:   No education needs identified at  this time   [place], RD, LDN Pager [phone] Weekend/On-Call Pager [phone]

## 2015-11-17 NOTE — NC FL2 (Signed)
[place] LEVEL OF CARE SCREENING TOOL     IDENTIFICATION  Patient Name: [name] Birthdate: [birth year] Sex: male Admission Date (Current Location): 11/15/2015  [place] and [place] Number:  Engineering geologist and Address:  [facility], [address], [place], [place] [address]      Provider Number: [id number]  Attending Physician Name and Address:  Theodoro Grist, MD  Relative Name and Phone Number:       Current Level of Care: Hospital Recommended Level of Care: [place], Memory Care Prior Approval Number:    Date Approved/Denied:   PASRR Number:    Discharge Plan: Domiciliary (Rest home)    Current Diagnoses: Patient Active Problem List   Diagnosis Date Noted  . UTI (lower urinary tract infection) 11/15/2015  . Encounter for wound re-check 11/02/2015  . Fall with injury to face 10/26/2015  . Cataracts, bilateral 10/01/2015  . Essential hypertension 08/27/2015  . Dementia 08/27/2015  . Bladder cancer ([place]) 11/17/2013  . Gross hematuria 11/17/2013  . Dyslipidemia 06/09/2011  . PVC (premature ventricular contraction) 06/09/2011    Orientation RESPIRATION BLADDER Height & Weight    Self  Normal Continent 5\' 6"  (167.6 cm) 155 lbs.  BEHAVIORAL SYMPTOMS/MOOD NEUROLOGICAL BOWEL NUTRITION STATUS      Incontinent Diet (Soft Diet, Thin Liquidss)  AMBULATORY STATUS COMMUNICATION OF NEEDS Skin   Supervision Verbally Normal                       Personal Care Assistance Level of Assistance  Bathing, Feeding, Dressing Bathing Assistance: Limited assistance Feeding assistance: Limited assistance Dressing Assistance: Limited assistance     Functional Limitations Info  Sight, Hearing, Speech Sight Info: Adequate Hearing Info: Impaired Speech Info: Adequate    SPECIAL CARE FACTORS FREQUENCY  PT (By licensed PT)     PT Frequency:  ([place])              Contractures      Additional  Factors Info  Code Status, Allergies Code Status Info: Full Code Allergies Info: No known allergies           Current Medications (11/17/2015):  This is the current hospital active medication list Current Facility-Administered Medications  Medication Dose Route Frequency Provider Last Rate Last Dose  . acetaminophen (TYLENOL) tablet 650 mg  650 mg Oral Q6H PRN Fritzi Mandes, MD   650 mg at 11/16/15 0107   Or  . acetaminophen (TYLENOL) suppository 650 mg  650 mg Rectal Q6H PRN Fritzi Mandes, MD      . brinzolamide (AZOPT) 1 % ophthalmic suspension 1 drop  1 drop Both Eyes BID Fritzi Mandes, MD   1 drop at 11/17/15 [phone]  . cefTRIAXone (ROCEPHIN) 1 g in dextrose 5 % 50 mL IVPB  1 g Intravenous QPC supper Fritzi Mandes, MD   1 g at 11/16/15 1704  . dutasteride (AVODART) capsule 0.5 mg  0.5 mg Oral Daily Fritzi Mandes, MD   0.5 mg at 11/17/15 [id number]  . feeding supplement (ENSURE ENLIVE) (ENSURE ENLIVE) liquid 237 mL  237 mL Oral TID WC Theodoro Grist, MD      . haloperidol lactate (HALDOL) injection 1 mg  1 mg Intravenous Q6H PRN Theodoro Grist, MD      . haloperidol lactate (HALDOL) injection 5 mg  5 mg Intramuscular Once Theodoro Grist, MD   5 mg at 11/16/15 1700  . hydrALAZINE (APRESOLINE) injection 10 mg  10 mg  Intravenous Q4H PRN Lance Coon, MD   10 mg at 11/15/15 2231  . latanoprost (XALATAN) 0.005 % ophthalmic solution 1 drop  1 drop Both Eyes QHS Fritzi Mandes, MD   1 drop at 11/15/15 2243  . lisinopril (PRINIVIL,ZESTRIL) tablet 10 mg  10 mg Oral Daily Fritzi Mandes, MD   10 mg at 11/17/15 [id number]  . loperamide (IMODIUM) capsule 2 mg  2 mg Oral Q6H PRN Theodoro Grist, MD      . LORazepam (ATIVAN) injection 1 mg  1 mg Intramuscular Once Lance Coon, MD      . ondansetron [facility]) tablet 4 mg  4 mg Oral Q6H PRN Fritzi Mandes, MD       Or  . ondansetron (ZOFRAN) injection 4 mg  4 mg Intravenous Q6H PRN Fritzi Mandes, MD      . oxyCODONE (Oxy IR/ROXICODONE) immediate release tablet 5 mg  5 mg Oral Q4H PRN Fritzi Mandes, MD          Discharge Medications: Please see discharge summary for a list of discharge medications.  Current Discharge Medication List    START taking these medications   Details  acetaminophen (TYLENOL) 325 MG tablet Take 2 tablets (650 mg total) by mouth every 6 (six) hours as needed for mild pain (or Fever >/= 101).    cholestyramine (QUESTRAN) 4 g packet Take 1 packet (4 g total) by mouth 2 (two) times daily. Qty: 60 each, Refills: 0    donepezil (ARICEPT) 10 MG tablet Take 1 tablet (10 mg total) by mouth at bedtime. Qty: 30 tablet, Refills: 0    feeding supplement, ENSURE ENLIVE, (ENSURE ENLIVE) LIQD Take 237 mLs by mouth 3 (three) times daily with meals. Qty: 237 mL, Refills: 12    oxyCODONE (OXY IR/ROXICODONE) 5 MG immediate release tablet Take 1 tablet (5 mg total) by mouth every 4 (four) hours as needed for moderate pain. Qty: 30 tablet, Refills: 0      CONTINUE these medications which have CHANGED   Details  dutasteride (AVODART) 0.5 MG capsule Take 1 capsule (0.5 mg total) by mouth daily. Qty: 30 capsule, Refills: 0      CONTINUE these medications which have NOT CHANGED   Details  brinzolamide (AZOPT) 1 % ophthalmic suspension Place 1 drop into both eyes 2 (two) times daily.    dorzolamide-timolol (COSOPT) 22.3-6.8 MG/ML ophthalmic solution Place 1 drop into the left eye 2 (two) times daily.    latanoprost (XALATAN) 0.005 % ophthalmic solution Place 1 drop into both eyes at bedtime.    lisinopril (PRINIVIL,ZESTRIL) 20 MG tablet Take 20 mg by mouth daily.      STOP taking these medications     doxycycline (VIBRA-TABS) 100 MG tablet            Relevant Imaging Results:  Relevant Lab Results:   Additional Information SSN:  [id number]  Darden Dates, LCSW

## 2015-11-18 NOTE — Progress Notes (Signed)
[facility] [place] NAME: [name]    MR#:  [id number]  DATE OF BIRTH:  May 25, 1923  SUBJECTIVE:  CHIEF COMPLAINT:   Chief Complaint  Patient presents with  . Diarrhea   Patient is a 82 year old Caucasian male with past medical history significant for history of arrhythmias, hyperlipidemia, cancer, BPH, hypertension who presents to the hospital with complaints of diarrhea for the past one month, he will be also had poor oral intake. No fevers were documented. In emergency room, he was found to be dehydrated and had pyuria, concerning for urinary tract infection. 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 Oncology consultation is appreciated, oncologist to meet family today to discuss options. Patient's family would like to place patient to skilled nursing facility due to concerns for his safety, falls, etc. psychiatrist does not feel that patient is capable to make decisions for use on care. Aricept was initiated   Review of Systems  Unable to perform ROS: dementia    VITAL SIGNS: Blood pressure 129/87, pulse 72, temperature 98.2 F (36.8 C), temperature source Oral, resp. rate 20, height 5\' 6"  (1.676 m), weight 70.398 kg (155 lb 3.2 oz), SpO2 97 %.  PHYSICAL EXAMINATION:   GENERAL:  82 y.o.-year-old patient lying in the bed with no acute distress. Comfortable, eating breakfast, no signs or symptoms of aspiration EYES: Pupils equal, round, reactive to light and accommodation. No scleral icterus. Extraocular muscles intact.  HEENT: Head atraumatic, normocephalic. Oropharynx and nasopharynx clear.  NECK:  Supple, no jugular venous distention. No thyroid enlargement, no tenderness.  LUNGS: Normal breath sounds bilaterally, no wheezing,  rales,rhonchi or crepitation. No use of accessory muscles of respiration.  CARDIOVASCULAR: S1, S2 normal. No murmurs, rubs, or gallops.  ABDOMEN: Soft, mild discomfort in suprapubic area on palpation, nondistended. Bowel sounds present. No organomegaly or mass.  EXTREMITIES: No pedal edema, cyanosis, or clubbing. Right thigh medial area mass extending along the major muscle groups of approximately 10 inches over 5 inches over 4 inches in size, firm to palpation but not fluctuating. No overlying skin abnormalities, redness and inflammation of significant pain on palpation NEUROLOGIC: Cranial nerves II through XII are intact. Muscle strength 5/5 in all extremities. Sensation intact. Gait not checked.  PSYCHIATRIC: The patient is alert and disoriented, also hard of hearing SKIN: No obvious rash, lesion, or ulcer.   ORDERS/RESULTS REVIEWED:   CBC  Recent Labs Lab 11/15/15 1322  WBC 8.6  HGB 12.9*  HCT 38.3*  PLT 228  MCV 92.9  MCH 31.2  MCHC 33.6  RDW 15.5*   ------------------------------------------------------------------------------------------------------------------  Chemistries   Recent Labs Lab 11/15/15 1322 11/16/15 0556  NA 141 141  K 3.6 3.9  CL 109 113*  CO2 23 22  GLUCOSE 99 101*  BUN 17 17  CREATININE 1.15 1.12  CALCIUM 8.4* 8.0*  AST 14*  --   ALT 11*  --   ALKPHOS 59  --   BILITOT 1.2  --    ------------------------------------------------------------------------------------------------------------------ estimated creatinine clearance is 39.6 mL/min (by C-G formula based on Cr of 1.12). ------------------------------------------------------------------------------------------------------------------ No results for input(s): TSH, T4TOTAL, T3FREE, THYROIDAB in the last 72 hours.  Invalid input(s): FREET3  Cardiac Enzymes No results for input(s): CKMB, TROPONINI, MYOGLOBIN in the last 168 hours.  Invalid input(s):  CK ------------------------------------------------------------------------------------------------------------------  Invalid input(s): POCBNP ---------------------------------------------------------------------------------------------------------------  RADIOLOGY: Mr Femur Right W Wo Contrast  11/17/2015  CLINICAL DATA:  Mass along the medial right thigh seen on ultrasound. EXAM: MRI OF THE RIGHT FEMUR WITHOUT AND WITH CONTRAST TECHNIQUE: Multiplanar, multisequence MR imaging of the lower right extremity was performed both before and after administration of intravenous contrast. CONTRAST:  89mL MULTIHANCE GADOBENATE DIMEGLUMINE 529 MG/ML IV SOLN COMPARISON:  11/16/2015 FINDINGS: 28.2 by 13.8 by 10.6 cm fatty mass primarily of the left posterior thigh is present extending from about the level of the knee joint cephalad. This mass completely surrounds the gracilis muscle, extends posterior and and lateral to the sartorius, envelops the combined semimembranosus and semitendinosus, anteriorly displaces and flattens the adductor magnus, and laterally displaces the biceps femoris. The sciatic nerve is displaced laterally along the margin of this mass and partially surrounded by the mass. Closer to the knee, the mass abuts the distal superficial femoral vessels and popliteal vessels. The mass has internal septations with high T2 signal and septal enhancement internally. I do not observe any definite internal enhancing nodules ; a internal low T1 signal nodule measuring 1.0 by 0.6 cm on image 19 series 3 along the anterior portion of the lesion probably represents a calcification, and several similar tiny nodules are present posteriorly along the upper margin of the lesion, but these do not appreciably enhance. No underlying bony abnormality observed. IMPRESSION: 1. Large fatty mass infiltrating the medial and posterior compartments of the thigh, measuring 28.2 cm in length, and surrounding and displacing multiple  structures as noted above. This mass abuts the distal superficial femoral vasculature and sciatic nerve. The mass has the enhancing septal elements and some punctate low signal intensity nonenhancing elements which may be calcifications. Top differential diagnostic considerations include liposarcoma and lipoma. Soft tissue hemangioma is a possibility although the degree of fatty replacement is greater than is typically seen in hemangioma. Electronically Signed   By: Van Clines M.D.   On: 11/17/2015 12:46    EKG:  Orders placed or performed in visit on 11/27/14  . EKG 12-Lead    ASSESSMENT AND PLAN:  Active Problems:   Dementia   UTI (lower urinary tract infection)   Protein-calorie malnutrition, severe 1. Diarrhea of unclear etiology, C. difficile negative, improved with cholestyramine, continue Imodium as needed, following clinically. Appreciated gastroenterologist input, no further interventions unless weight loss or bleeding.  2. Failure to thrive adult, initiated Calorie count, oral intake is relatively good to 40-100% 3. Pyuria, likely contamination , as patient's urine culture revealed multiple species , the patient is off Rocephin and follow for clinical signs of urinary tract infection, remains afebrile and has no abdominal pain on palpation or dysuria symptoms 4. Anemia. Get Hemoccult, follow hemoglobin level tomorrow morning 5. Generalized weakness, physical therapist recommended home health services. However, patient refused, patient's family, however, is interested in placement to skilled nursing facility due to dementia. Getting psychiatrist involved to discuss and determine patient's ability to comprehend medical issues he is faced with, psychiatry does not feel that patient has capability to make decisions. Bed search is initiated 6. Right thigh mass of unclear etiology, concerning for lipoma versus liposarcoma per MRI , appreciate oncologist input , he will discuss with  family risks as well as benefits of further investigation  7. Dementia, in psychiatrist opinion, patient does not have capacity to make decisions for medical care, he also neglects himself, recommend to establish guardianship, Aricept is initiated 8. Failure to thrive adult, as above,  patient's family is interested in placement, getting PPD placed/read   Management plans discussed with the patient, family and they are in agreement.   DRUG ALLERGIES: No Known Allergies  CODE STATUS:     Code Status Orders        Start     Ordered   11/15/15 2102  Full code   Continuous     11/15/15 2102    Code Status History    Date Active Date Inactive Code Status Order ID Comments User Context   This patient has a current code status but no historical code status.      TOTAL TIME TAKING CARE OF THIS PATIENT: 35 minutes.    Discussed with oncology today  Milbert Bixler M.D on 11/18/2015 at 3:57 PM  Between 7am to 6pm - Pager - [phone]  After 6pm go to [email or web address] - password [email or web address] [facility]  [facility]  Office  [phone]  CC: Primary care physician; [name], MD

## 2015-11-18 NOTE — Progress Notes (Signed)
Physical Therapy Treatment Patient Details Name: [name] MRN: [id number] DOB: 12-03-1922 Today's Date: 11/18/2015    History of Present Illness [name] is a 82 y.o. male with a known history of hyperlipidemia, bladder cancer status post treatment, BPH, hypertension, glaucoma comes to the emergency room from home accompanied by his stepdaughter. Patient lives alone and is looked up on by daughter is visiting on daily basis. He has been having diarrheal stools for approximately 2 weeks. Patient has was covered in dried feces and was in found in filthy condition by daughter. She is had poor by mouth intake. No fever. The emergency room patient is hemodynamically stable however he was found to be clinically dehydrated and found to have UTI and significant diarrhea is being admitted for further evaluation and management.    PT Comments    Pt demonstrates good stability and strength with transfers and ambulation. He is able to perform head turns and gait speed changes with minimal lateral gait deviations. Able to complete all bed exercises as instructed. Pt provides minimal communication due to confusion. Pt will benefit from skilled PT services to address deficits in strength, balance, and mobility in order to return to full function at home.    Follow Up Recommendations  Home health PT;Supervision/Assistance - 24 hour (Pt refuses, please do not arrange)     Equipment Recommendations  None recommended by PT    Recommendations for Other Services       Precautions / Restrictions Precautions Precautions: Fall Restrictions Weight Bearing Restrictions: No    Mobility  Bed Mobility Overal bed mobility: Independent             General bed mobility comments: Good speed and sequencing noted  Transfers Overall transfer level: Needs assistance Equipment used: None Transfers: Sit to/from Stand Sit to Stand: Supervision         General transfer comment: Good strength and  stability with sit to stand transfers. No assistive device required. Performed x 10 for patient at EOB for strengthening  Ambulation/Gait Ambulation/Gait assistance: Supervision Ambulation Distance (Feet): 450 Feet Assistive device: None Gait Pattern/deviations: Step-through pattern   Gait velocity interpretation: >2.62 ft/sec, indicative of independent community ambulator General Gait Details: Pt demonstrates good step length and gait speed. He is able to perform horizontal and vertical head turns as well as gait speed changes without notable lateral gait deviations. Pt provides minimal communication and is HOH which makes communication challenging. Fair scanning of visual environment but somewhat limited   Stairs            Wheelchair Mobility    Modified Rankin (Stroke Patients Only)       Balance Overall balance assessment: Needs assistance   Sitting balance-Leahy Scale: Normal       Standing balance-Leahy Scale: Fair                      Cognition Arousal/Alertness: Awake/alert Behavior During Therapy: WFL for tasks assessed/performed Overall Cognitive Status: History of cognitive impairments - at baseline (AOx1 at time of evaluation. Baseline confusion, mildly worse)                      Exercises General Exercises - Lower Extremity Long Arc Quad: Strengthening;Both;Seated;15 reps Heel Slides: Strengthening;Both;15 reps;Seated Hip ABduction/ADduction: Strengthening;Both;15 reps;Seated (Performed x 10 in supine as well) Straight Leg Raises: Strengthening;Both;15 reps;Supine Hip Flexion/Marching: Strengthening;Both;15 reps;Seated Heel Raises: Strengthening;Both;15 reps;Seated    General Comments  Pertinent Vitals/Pain Pain Assessment: No/denies pain    Home Living Family/patient expects to be discharged to:: Unsure Living Arrangements: Alone Available Help at Discharge: Family Type of Home: House Home Access: Stairs to  enter Entrance Stairs-Rails: Right Home Layout: Able to live on main level with bedroom/bathroom;Laundry or work area in [place]: Environmental consultant - standard;Cane - single point      Prior Function Level of Independence: Independent      Comments: Assist with IADLs. Step-daughter states that pt is not caring for himself well. She does not believe he bathes himself   PT Goals (current goals can now be found in the care plan section) Acute Rehab PT Goals Patient Stated Goal: Return home PT Goal Formulation: With patient/family Time For Goal Achievement: 11/30/15 Potential to Achieve Goals: Good Progress towards PT goals: Progressing toward goals    Frequency  Min 2X/week    PT Plan Current plan remains appropriate    Co-evaluation             End of Session Equipment Utilized During Treatment: Gait belt Activity Tolerance: Patient tolerated treatment well Patient left: in bed;with call bell/phone within reach;with bed alarm set;with family/visitor present     Time: [id number] PT Time Calculation (min) (ACUTE ONLY): 28 min  Charges:  $Gait Training: 8-22 mins $Therapeutic Exercise: 8-22 mins                    G Codes:      Lyndel Safe Huprich PT, DPT   Huprich,Jason 11/18/2015, 11:57 AM

## 2015-11-18 NOTE — Progress Notes (Signed)
[name]   DOB:[birth year]   [id number]    Subjective: Patient is having his dinner. He does not complain of any pain. Further- symptoms cannot be elicited given his dementia.   Objective:  Filed Vitals:   11/18/15 0556 11/18/15 1337  BP: 124/69 129/87  Pulse: 62 72  Temp: 98 F (36.7 C) 98.2 F (36.8 C)  Resp: 20      Intake/Output Summary (Last 24 hours) at 11/18/15 1704 Last data filed at 11/18/15 1345  Gross per 24 hour  Intake    720 ml  Output      4 ml  Net    716 ml    GENERAL:alert, no distress and comfortable    Labs:  Lab Results  Component Value Date   WBC 8.6 11/15/2015   HGB 12.9* 11/15/2015   HCT 38.3* 11/15/2015   MCV 92.9 11/15/2015   PLT 228 11/15/2015   NEUTROABS 4.3 07/14/2009    Lab Results  Component Value Date   NA 141 11/16/2015   K 3.9 11/16/2015   CL 113* 11/16/2015   CO2 22 11/16/2015    Studies:  Mr Femur Right W Wo Contrast  11/17/2015  CLINICAL DATA:  Mass along the medial right thigh seen on ultrasound. EXAM: MRI OF THE RIGHT FEMUR WITHOUT AND WITH CONTRAST TECHNIQUE: Multiplanar, multisequence MR imaging of the lower right extremity was performed both before and after administration of intravenous contrast. CONTRAST:  66mL MULTIHANCE GADOBENATE DIMEGLUMINE 529 MG/ML IV SOLN COMPARISON:  11/16/2015 FINDINGS: 28.2 by 13.8 by 10.6 cm fatty mass primarily of the left posterior thigh is present extending from about the level of the knee joint cephalad. This mass completely surrounds the gracilis muscle, extends posterior and and lateral to the sartorius, envelops the combined semimembranosus and semitendinosus, anteriorly displaces and flattens the adductor magnus, and laterally displaces the biceps femoris. The sciatic nerve is displaced laterally along the margin of this mass and partially surrounded by the mass. Closer to the knee, the mass abuts the distal superficial femoral vessels and popliteal vessels. The mass has internal  septations with high T2 signal and septal enhancement internally. I do not observe any definite internal enhancing nodules ; a internal low T1 signal nodule measuring 1.0 by 0.6 cm on image 19 series 3 along the anterior portion of the lesion probably represents a calcification, and several similar tiny nodules are present posteriorly along the upper margin of the lesion, but these do not appreciably enhance. No underlying bony abnormality observed. IMPRESSION: 1. Large fatty mass infiltrating the medial and posterior compartments of the thigh, measuring 28.2 cm in length, and surrounding and displacing multiple structures as noted above. This mass abuts the distal superficial femoral vasculature and sciatic nerve. The mass has the enhancing septal elements and some punctate low signal intensity nonenhancing elements which may be calcifications. Top differential diagnostic considerations include liposarcoma and lipoma. Soft tissue hemangioma is a possibility although the degree of fatty replacement is greater than is typically seen in hemangioma. Electronically Signed   By: Van Clines M.D.   On: 11/17/2015 12:46    Assessment & Plan:   82 year old male patient with prior history of bladder cancer; and severe dementia- currently noted to have  # Right thigh mass- clinically based on ultrasound approximately 5 x 8 cm in size; however based on MRI measures 10 x 28 cm- differential diagnosis including liposarcoma versus lipoma. I reviewed the images myself.  # Severe dementia/history of bladder cancer [status post  chemoradiation-[place] 6 or 7 years ago]   Recommendations:   I had a long discussion with the patient's stepdaughter/son/ and 2 other family members- regarding the concerning findings on the MRI/which is highly suggestive of malignancy- however only confirmed on biopsy. Also discussed that if this turns out to be malignancy on biopsy; treatments will be palliative only. In the context  of his severe dementia; and previous statements from the patient that he did not want any treatments [at that time patient was undergoing treatment for bladder cancer with chemoradiation]- family decided not to pursue any investigative options at this time.  They do understand given the possible underlying malignancy- this could progress and cause his demise.  They're also interested in placement in a nursing home at this time.  I think this is a reasonable plan given his significant dementia issues.   The above plan of care was discussed with Dr. Ether Griffins.She agrees.  # 35 minutes face-to-face with the patient discussing the above plan of care with patient's family; more than 50% of time spent on prognosis/ natural history; counseling and coordination.    Cammie Sickle, MD 11/18/2015  5:04 PM

## 2015-11-18 NOTE — Consult Note (Signed)
  Psychiatry: Follow-up for this 82 year old man with progressive dementia. Patient interviewed. Spoke with his daughter again today as well. Chart reviewed. Patient himself says he is feeling fine. He does not seem to have any awareness of his current medical problems. Seemed pleased however and had no particular complaints. Daughter appears to be sad but understanding of his current medical situation. Has appropriate plans to try and make sure he is comfortable and well taken care of.  Patient slept well last night. Appears to be a little more calm today. Appears to have tolerated beginning Aricept. Continue that medicine with plans to gradually increase and possibly add another medicine to slow progression of dementia. Supportive counseling. Follow-up as needed. No change to diagnosis.

## 2015-11-18 NOTE — Plan of Care (Signed)
Problem: Safety: Goal: Ability to remain free from injury will improve Outcome: Progressing Improving but currently has a sitter 1:1 prn. Will reevaluate the need for a sitter today.  Seen by Dr Weber Cooks yesterday. Aricept added to medication regimen.   Problem: Bowel/Gastric: Goal: Will not experience complications related to bowel motility Outcome: Progressing Pt started on Questran to decrease the frequency of bowel movements.

## 2015-11-19 MED ORDER — CHOLESTYRAMINE 4 G PO PACK
4.0000 g | PACK | Freq: Two times a day (BID) | ORAL | Status: DC
  Administered 2015-11-19: 20:00:00 via ORAL
  Administered 2015-11-20 – 2015-11-22 (×4): 4 g via ORAL
  Filled 2015-11-19 (×6): qty 1

## 2015-11-19 NOTE — Progress Notes (Signed)
[facility] [place] NAME: [name]    MR#:  [id number]  DATE OF BIRTH:  22-Aug-1923  SUBJECTIVE:  CHIEF COMPLAINT:   Chief Complaint  Patient presents with  . Diarrhea   Patient is a 82 year old Caucasian male with past medical history significant for history of arrhythmias, hyperlipidemia, cancer, BPH, hypertension who presents to the hospital with complaints of diarrhea for the past one month, he will be also had poor oral intake. No fevers were documented. In emergency room, he was found to be dehydrated and had pyuria, concerning for urinary tract infection. 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 Oncology consultation is appreciated, oncologist to meet family today to discuss options. Patient's family would like to place patient to skilled nursing facility due to concerns for his safety, falls, etc. psychiatrist does not feel that patient is capable to make decisions for use on care. Aricept was initiated. Patient felt good today. Did not complain of any significant pain or discomfort, appetite is relatively good. Diarrhea is subsiding. Gastroenterologist recommends to advance cholestyramine to twice daily dose, Imodium as needed. Oncologist discussed this patient's family who decided not to pursue evaluation further    Review of Systems  Unable to perform ROS: dementia    VITAL SIGNS: Blood pressure 144/80, pulse 75, temperature 97.8 F (36.6 C), temperature source Oral, resp. rate 19, height 5\' 6"  (1.676 m), weight 70.398 kg (155 lb 3.2 oz), SpO2 95 %.  PHYSICAL EXAMINATION:   GENERAL:  82 y.o.-year-old patient lying in the bed with no acute distress. Comfortable, eating breakfast, no signs or symptoms of aspiration EYES: Pupils  equal, round, reactive to light and accommodation. No scleral icterus. Extraocular muscles intact.  HEENT: Head atraumatic, normocephalic. Oropharynx and nasopharynx clear.  NECK:  Supple, no jugular venous distention. No thyroid enlargement, no tenderness.  LUNGS: Normal breath sounds bilaterally, no wheezing, rales,rhonchi or crepitation. No use of accessory muscles of respiration.  CARDIOVASCULAR: S1, S2 normal. No murmurs, rubs, or gallops.  ABDOMEN: Soft, mild discomfort in suprapubic area on palpation, nondistended. Bowel sounds present. No organomegaly or mass.  EXTREMITIES: No pedal edema, cyanosis, or clubbing. Right thigh medial area mass extending along the major muscle groups of approximately 10 inches over 5 inches over 4 inches in size, firm to palpation but not fluctuating. No overlying skin abnormalities, redness and inflammation of significant pain on palpation NEUROLOGIC: Cranial nerves II through XII are intact. Muscle strength 5/5 in all extremities. Sensation intact. Gait not checked.  PSYCHIATRIC: The patient is alert and disoriented, also hard of hearing SKIN: No obvious rash, lesion, or ulcer.   ORDERS/RESULTS REVIEWED:   CBC  Recent Labs Lab 11/15/15 1322  WBC 8.6  HGB 12.9*  HCT 38.3*  PLT 228  MCV 92.9  MCH 31.2  MCHC 33.6  RDW 15.5*   ------------------------------------------------------------------------------------------------------------------  Chemistries   Recent Labs Lab 11/15/15 1322 11/16/15 0556  NA 141 141  K 3.6 3.9  CL 109 113*  CO2 23 22  GLUCOSE 99 101*  BUN 17 17  CREATININE 1.15 1.12  CALCIUM 8.4* 8.0*  AST 14*  --   ALT 11*  --   ALKPHOS 59  --   BILITOT 1.2  --    ------------------------------------------------------------------------------------------------------------------ estimated creatinine clearance is 39.6 mL/min (by  C-G formula based on Cr of  1.12). ------------------------------------------------------------------------------------------------------------------ No results for input(s): TSH, T4TOTAL, T3FREE, THYROIDAB in the last 72 hours.  Invalid input(s): FREET3  Cardiac Enzymes No results for input(s): CKMB, TROPONINI, MYOGLOBIN in the last 168 hours.  Invalid input(s): CK ------------------------------------------------------------------------------------------------------------------ Invalid input(s): POCBNP ---------------------------------------------------------------------------------------------------------------  RADIOLOGY: No results found.  EKG:  Orders placed or performed in visit on 11/27/14  . EKG 12-Lead    ASSESSMENT AND PLAN:  Active Problems:   Dementia   UTI (lower urinary tract infection)   Protein-calorie malnutrition, severe 1. Diarrhea of unclear etiology, C. difficile negative, improved with cholestyramine, advance to twice daily, continue Imodium as needed, better clinically. Appreciated gastroenterologist input, no further interventions unless weight loss or bleeding.  2. Failure to thrive adult, oral intake is relatively good to 40-100%, calorie count was discontinued, discussed with dietitian 3. Pyuria, likely contamination , as patient's urine culture revealed multiple species , the patient is off Rocephin and follow for clinical signs of urinary tract infection, remains afebrile and has no abdominal pain on palpation or dysuria symptoms 4. Anemia. Hemoccult is not obtained. Despite order given few days ago .  follow hemoglobin level tomorrow morning 5. Generalized weakness, physical therapist recommended home health services. However, patient refused, patient's family, however, is interested in placement to skilled nursing facility due to dementia. Psychiatrist got  involved to determine patient's ability to comprehend medical issues he is faced with, psychiatry does not feel that patient  has capability to make decisions. Bed search is initiated for placement, patient will be going to assisted living facility next week 6. 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 Family decided against further investigation.  7. Dementia, Aricept is initiated, and tolerates Aricept well 8. Failure to thrive adult, as above, patient's family is interested in placement, getting PPD placed/read   Management plans discussed with the patient, family and they are in agreement.   DRUG ALLERGIES: No Known Allergies  CODE STATUS:     Code Status Orders        Start     Ordered   11/15/15 2102  Full code   Continuous     11/15/15 2102    Code Status History    Date Active Date Inactive Code Status Order ID Comments User Context   This patient has a current code status but no historical code status.      TOTAL TIME TAKING CARE OF THIS PATIENT: 35 minutes.    discussed with care management/social work in regards to discharge Clyman M.D on 11/19/2015 at 3:43 PM  Between 7am to 6pm - Pager - [phone]  After 6pm go to [email or web address] - password [email or web address] [facility]  [facility]  Office  [phone]  CC: Primary care physician; [name], MD

## 2015-11-19 NOTE — Consult Note (Signed)
  According to nurse, pt having less diarrhea. Stool is getting more firm though not solid yet. Though has dementia, no evidence of fecal incontinence. On questran once daily plus imodium prn. Can increase questran to bid or even upto 4 times a day if necessary if diarrhea worsens. Otherwise, will sign off. Thanks.

## 2015-11-19 NOTE — Care Management Important Message (Signed)
Important Message  Patient Details  Name: [name] MRN: [id number] Date of Birth: 08/20/1923   Medicare Important Message Given:  Yes    [name] 11/19/2015, 9:50 AM

## 2015-11-19 NOTE — Progress Notes (Signed)
Nutrition Follow-up  DOCUMENTATION CODES:   Severe malnutrition in context of chronic illness  INTERVENTION:   Meals and Snacks: Cater to patient preferences, will send [employer] on all trays Medical Food Supplement Therapy: Continue Ensure as ordered; will add Magic Cup Coordination of Care: spoke with Md, [place] regarding results of calorie count and documented po intake. MD agreeable to discontinue calorie count at this time.  Calorie Count Results: 48 hour calorie count ordered. Only have tickets for Wednesday 1/25, no tickets for Thursday 1/26 found in envelope. Breakfast: 100% peaches, 100% scrambled eggs, 100% home fries, 100% toast with water Lunch: 100% pot roast, 100% gravy, 70% pears, 70% Ensure, 70% mashed potatoes, 70% carrots Dinner: 50% penne pasta, 25% mac and cheese, 100% Ensure, 100% orange sherbet, water Supplements: Ensure x2  Total intake for Wednesday 1/25: 1600 kcals (93% of minimum estimated needs)  90g protein (100% of minimum estimated needs)   NUTRITION DIAGNOSIS:   Malnutrition related to chronic illness as evidenced by percent weight loss, energy intake < or equal to 75% for > or equal to 1 month.  GOAL:   Patient will meet greater than or equal to 90% of their needs  MONITOR:    (Energy Intake, Anthropometrics, Digsetive system, Electrolyte and renal Profile)  REASON FOR ASSESSMENT:    (Calorie Count)    ASSESSMENT:   Pt admitted from home with UTI, FTT and diarrhea for the past month. Pt confused combative at times per Nsg, currently with 1:1 sitter. Sitter has since been d/c.  Oncology following; per MD note pt with right mass likely malignant.  Per MD note, pt's family interested in placement. Psych following. Pt continues on immodium.   Diet Order:  DIET SOFT Room service appropriate?: Yes; Fluid consistency:: Thin   Current Nutrition: Per recorded documentation pt eating 56% of meals on average. Pt daughter in law reports pt  ate very well this am, 100% of meal and that with encouragement pt will drink Ensure. Pt likes ice cream and orange sherbet.   Gastrointestinal Profile: Last BM: 11/19/2015   Scheduled Medications:  . brinzolamide  1 drop Both Eyes BID  . cholestyramine  4 g Oral Daily  . donepezil  5 mg Oral QHS  . dutasteride  0.5 mg Oral Daily  . feeding supplement (ENSURE ENLIVE)  237 mL Oral TID WC  . haloperidol lactate  5 mg Intramuscular Once  . latanoprost  1 drop Both Eyes QHS  . lisinopril  10 mg Oral Daily  . LORazepam  1 mg Intramuscular Once  . tuberculin  5 Units Intradermal Once     Electrolyte/Renal Profile and Glucose Profile:   Recent Labs Lab 11/15/15 1322 11/16/15 0556  NA 141 141  K 3.6 3.9  CL 109 113*  CO2 23 22  BUN 17 17  CREATININE 1.15 1.12  CALCIUM 8.4* 8.0*  GLUCOSE 99 101*   Protein Profile:  Recent Labs Lab 11/15/15 1322  ALBUMIN 3.9    Weight Trend since Admission: Filed Weights   11/15/15 1303 11/15/15 2057  Weight: 162 lb (73.483 kg) 155 lb 3.2 oz (70.398 kg)    BMI:  Body mass index is 25.06 kg/(m^2).  Estimated Nutritional Needs:   Kcal:  BEE: 1301kcals, TEE: (IF 1.1-1.3)(AF 1.2) 1718-2030kcals  Protein:  70-84g protein( 1.0-1.2g/kg)  Fluid:  1760-21109mL of fluid (25-42mL/kg)  EDUCATION NEEDS:   No education needs identified at this time  [place], RD, LDN Pager [phone] Weekend/On-Call  Pager [phone]

## 2015-11-19 NOTE — Plan of Care (Signed)
Problem: Education: Goal: Knowledge of [facility] Education information/materials will improve Outcome: Not Met (add Reason) Pt with dementia. Oriented to self.   Problem: Safety: Goal: Ability to remain free from injury will improve Outcome: Progressing Sitter for safety at bedside. Pt becoming less impulsive. Second night on Aricept.   Problem: Physical Regulation: Goal: Will remain free from infection Outcome: Progressing In with UTI. Afebrile. WBC count 8.6 on 1/23.   Problem: Fluid Volume: Goal: Ability to maintain a balanced intake and output will improve Outcome: Progressing Less episodes of diarrhea

## 2015-11-19 NOTE — Clinical Social Work Note (Signed)
Pt is unable to go to [facility] until next week due to paperwork, per facility. Addmissions is still clarifying that pt is able to admit with corperate. MD is aware at that pt does not have a safe discharge plan. Pt will not allow pt's stepdaughter to assist in his care (example: tolieting and bathing) and she is unable to secure PCS due to late notice. Pt is unable to return home. Pt has also been served his paperwork for guardianship as petitioned by [employer].   CSW spoke with pt's stepdaughter shared that pt is now open to [facility] as APS worker was at the home while stepdaughter was picking up the mail. Per APS worker, [facility] Monday. CSW followed up with [facility]. CSW resent referral and admissions will do an assessment and likely be able to accept pt on Monday Afternoon. CSW updated RNCM. CSW will continue to follow.   Darden Dates, MSW, LCSW  Clinical Social Worker  [phone]

## 2015-11-20 LAB — HEMOGLOBIN: Hemoglobin: 13 g/dL (ref 13.0–18.0)

## 2015-11-20 NOTE — Plan of Care (Signed)
Problem: Safety: Goal: Ability to remain free from injury will improve Outcome: Not Progressing Patient is uncooperative and gets agitated when touched.  He will not call for assistance and gets out of bed on his own.  He will not allow nursing staff to attend to him in the bathroom.

## 2015-11-20 NOTE — Progress Notes (Addendum)
[facility] [place] NAME: [name]    MR#:  [id number]  DATE OF BIRTH:  [birth year]  SUBJECTIVE:  CHIEF COMPLAINT:   Chief Complaint  Patient presents with  . Diarrhea   Patient is very hard of hearing. No overnight events. No family members at bedside. Patient is a poor historian  Review of Systems  Unable to perform ROS: dementia    VITAL SIGNS: Blood pressure 168/84, pulse 84, temperature 98.6 F (37 C), temperature source Oral, resp. rate 20, height 5\' 6"  (1.676 m), weight 70.398 kg (155 lb 3.2 oz), SpO2 96 %.  PHYSICAL EXAMINATION:   GENERAL:  82 y.o.-year-old patient lying in the bed with no acute distress. Comfortable, eating breakfast, no signs or symptoms of aspiration EYES: Pupils equal, round, reactive to light and accommodation. No scleral icterus. Extraocular muscles intact.  HEENT: Head atraumatic, normocephalic. Oropharynx and nasopharynx clear.  NECK:  Supple, no jugular venous distention. No thyroid enlargement, no tenderness.  LUNGS: Normal breath sounds bilaterally, no wheezing, rales,rhonchi or crepitation. No use of accessory muscles of respiration.  CARDIOVASCULAR: S1, S2 normal. No murmurs, rubs, or gallops.  ABDOMEN: Soft, mild discomfort in suprapubic area on palpation, nondistended. Bowel sounds present. No organomegaly or mass.  EXTREMITIES: No pedal edema, cyanosis, or clubbing. Right thigh medial area mass extending along the major muscle groups of approximately 10 inches over 5 inches over 4 inches in size, firm to palpation but not fluctuating. No overlying skin abnormalities, redness and inflammation of significant pain on palpation NEUROLOGIC: Cranial nerves II through XII are intact. Muscle strength 5/5 in all extremities. Sensation intact. Gait not checked.  PSYCHIATRIC: The patient is alert and disoriented, also hard of hearing SKIN: No obvious rash, lesion, or ulcer.   ORDERS/RESULTS  REVIEWED:   CBC  Recent Labs Lab 11/15/15 1322 11/20/15 0542  WBC 8.6  --   HGB 12.9* 13.0  HCT 38.3*  --   PLT 228  --   MCV 92.9  --   MCH 31.2  --   MCHC 33.6  --   RDW 15.5*  --    ------------------------------------------------------------------------------------------------------------------  Chemistries   Recent Labs Lab 11/15/15 1322 11/16/15 0556  NA 141 141  K 3.6 3.9  CL 109 113*  CO2 23 22  GLUCOSE 99 101*  BUN 17 17  CREATININE 1.15 1.12  CALCIUM 8.4* 8.0*  AST 14*  --   ALT 11*  --   ALKPHOS 59  --   BILITOT 1.2  --    ------------------------------------------------------------------------------------------------------------------ estimated creatinine clearance is 39.6 mL/min (by C-G formula based on Cr of 1.12). ------------------------------------------------------------------------------------------------------------------ No results for input(s): TSH, T4TOTAL, T3FREE, THYROIDAB in the last 72 hours.  Invalid input(s): FREET3  Cardiac Enzymes No results for input(s): CKMB, TROPONINI, MYOGLOBIN in the last 168 hours.  Invalid input(s): CK ------------------------------------------------------------------------------------------------------------------ Invalid input(s): POCBNP ---------------------------------------------------------------------------------------------------------------  RADIOLOGY: No results found.  EKG:  Orders placed or performed in visit on 11/27/14  . EKG 12-Lead    ASSESSMENT AND PLAN:  Active Problems:   Dementia   UTI (lower urinary tract infection)   Protein-calorie malnutrition, severe 1. Diarrhea of unclear etiology, C. difficile negative, improved with cholestyramine, advanced to twice daily, continue Imodium as needed, better clinically. Appreciated gastroenterologist input, no further interventions unless weight loss or bleeding.   2. Failure to thrive adult, oral intake is relatively good to 40-100%,  calorie count was discontinued, discussed with dietitian  3. Pyuria, likely  contamination , as patient's urine culture revealed multiple species , the patient is off Rocephin and follow for clinical signs of urinary tract infection, remains afebrile and has no abdominal pain on palpation or dysuria symptoms  4. Anemia. Hemoccult is not obtained. Despite order given few days ago .  follow hemoglobin level   5. Generalized weakness, physical therapist recommended home health services. However, patient refused, patient's family, however, is interested in placement to skilled nursing facility due to dementia. Psychiatrist got  involved to determine patient's ability to comprehend medical issues he is faced with, psychiatry does not feel that patient has capability to make decisions. Bed search is initiated for placement, patient will be going to assisted living facility next week  6. 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 Family decided against further investigation.   7. Dementia, Aricept is initiated, and tolerates Aricept well  8. Failure to thrive adult, as above, patient's family is interested in placement, getting PPD placed/read  9. Urine with blood clot as reported by RN-urinalysis is ordered Check CBC in a.m.   Palliative care consult is pending Placement is pending. Follow-up with social worker   DRUG ALLERGIES: No Known Allergies  CODE STATUS:     Code Status Orders        Start     Ordered   11/15/15 2102  Full code   Continuous     11/15/15 2102    Code Status History    Date Active Date Inactive Code Status Order ID Comments User Context   This patient has a current code status but no historical code status.      TOTAL TIME TAKING CARE OF THIS PATIENT: 32 minutes.    discussed with care management/social  work in regards to discharge planning   Nicholes Mango M.D on 11/20/2015 at 4:04 PM  Between 7am to 6pm - Pager - [phone]  After 6pm go to [email or web address] - password [email or web address] [facility]  [place] [facility]  Office  [phone]  CC: Primary care physician; [name], MD

## 2015-11-21 LAB — CBC
HCT: 37.4 % — ABNORMAL LOW (ref 40.0–52.0)
Hemoglobin: 12.8 g/dL — ABNORMAL LOW (ref 13.0–18.0)
MCH: 31.4 pg (ref 26.0–34.0)
MCHC: 34.2 g/dL (ref 32.0–36.0)
MCV: 92 fL (ref 80.0–100.0)
Platelets: 202 10*3/uL (ref 150–440)
RBC: 4.07 MIL/uL — AB (ref 4.40–5.90)
RDW: 16.1 % — ABNORMAL HIGH (ref 11.5–14.5)
WBC: 10.2 10*3/uL (ref 3.8–10.6)

## 2015-11-21 MED ORDER — LISINOPRIL 20 MG PO TABS
20.0000 mg | ORAL_TABLET | Freq: Every day | ORAL | Status: DC
  Administered 2015-11-22: 12:00:00 20 mg via ORAL
  Filled 2015-11-21: qty 1

## 2015-11-21 MED ORDER — DONEPEZIL HCL 5 MG PO TABS
10.0000 mg | ORAL_TABLET | Freq: Every day | ORAL | Status: DC
  Administered 2015-11-21: 21:00:00 10 mg via ORAL
  Filled 2015-11-21: qty 2

## 2015-11-21 MED ORDER — LISINOPRIL 10 MG PO TABS
10.0000 mg | ORAL_TABLET | Freq: Once | ORAL | Status: AC
  Administered 2015-11-21: 10 mg via ORAL
  Filled 2015-11-21: qty 1

## 2015-11-21 NOTE — Progress Notes (Signed)
[place] [facility] [place] NAME: [name]    MR#:  [id number]  DATE OF BIRTH:  Mar 24, 1923  SUBJECTIVE:  CHIEF COMPLAINT:   Chief Complaint  Patient presents with  . Diarrhea   Patient is very hard of hearing. No overnight events. Diarrhea improved Patient is a poor historian  Review of Systems  Unable to perform ROS: dementia    VITAL SIGNS: Blood pressure 168/93, pulse 78, temperature 98.4 F (36.9 C), temperature source Oral, resp. rate 20, height 5\' 6"  (1.676 m), weight 70.398 kg (155 lb 3.2 oz), SpO2 99 %.  PHYSICAL EXAMINATION:   GENERAL:  82 y.o.-year-old patient lying in the bed with no acute distress. Comfortable, eating breakfast, no signs or symptoms of aspiration EYES: Pupils equal, round, reactive to light and accommodation. No scleral icterus. Extraocular muscles intact.  HEENT: Head atraumatic, normocephalic. Oropharynx and nasopharynx clear.  NECK:  Supple, no jugular venous distention. No thyroid enlargement, no tenderness.  LUNGS: Normal breath sounds bilaterally, no wheezing, rales,rhonchi or crepitation. No use of accessory muscles of respiration.  CARDIOVASCULAR: S1, S2 normal. No murmurs, rubs, or gallops.  ABDOMEN: Soft, mild discomfort in suprapubic area on palpation, nondistended. Bowel sounds present. No organomegaly or mass.  EXTREMITIES: No pedal edema, cyanosis, or clubbing. Right thigh medial area mass extending along the major muscle groups of approximately 10 inches over 5 inches over 4 inches in size, firm to palpation but not fluctuating. No overlying skin abnormalities, redness and inflammation of significant pain on palpation NEUROLOGIC: Cranial nerves II through XII are intact. Muscle strength 5/5 in all extremities. Sensation intact. Gait not checked.  PSYCHIATRIC: The patient is alert and disoriented, also hard of hearing SKIN: No obvious rash, lesion, or ulcer.   ORDERS/RESULTS REVIEWED:    CBC  Recent Labs Lab 11/15/15 1322 11/20/15 0542 11/21/15 0546  WBC 8.6  --  10.2  HGB 12.9* 13.0 12.8*  HCT 38.3*  --  37.4*  PLT 228  --  202  MCV 92.9  --  92.0  MCH 31.2  --  31.4  MCHC 33.6  --  34.2  RDW 15.5*  --  16.1*   ------------------------------------------------------------------------------------------------------------------  Chemistries   Recent Labs Lab 11/15/15 1322 11/16/15 0556  NA 141 141  K 3.6 3.9  CL 109 113*  CO2 23 22  GLUCOSE 99 101*  BUN 17 17  CREATININE 1.15 1.12  CALCIUM 8.4* 8.0*  AST 14*  --   ALT 11*  --   ALKPHOS 59  --   BILITOT 1.2  --    ------------------------------------------------------------------------------------------------------------------ estimated creatinine clearance is 39.6 mL/min (by C-G formula based on Cr of 1.12). ------------------------------------------------------------------------------------------------------------------ No results for input(s): TSH, T4TOTAL, T3FREE, THYROIDAB in the last 72 hours.  Invalid input(s): FREET3  Cardiac Enzymes No results for input(s): CKMB, TROPONINI, MYOGLOBIN in the last 168 hours.  Invalid input(s): CK ------------------------------------------------------------------------------------------------------------------ Invalid input(s): POCBNP ---------------------------------------------------------------------------------------------------------------  RADIOLOGY: No results found.  EKG:  Orders placed or performed in visit on 11/27/14  . EKG 12-Lead    ASSESSMENT AND PLAN:  Active Problems:   Dementia   UTI (lower urinary tract infection)   Protein-calorie malnutrition, severe 1. Diarrhea of unclear etiology, C. difficile negative, improved with cholestyramine, advanced to twice daily, continue Imodium as needed, better clinically. Appreciated gastroenterologist input, no further interventions unless weight loss or bleeding.   2. Failure to thrive  adult, oral intake is relatively good to 40-100%, calorie count was discontinued,  discussed with dietitian  3. Pyuria, likely contamination , as patient's urine culture revealed multiple species , the patient is off Rocephin and follow for clinical signs of urinary tract infection, remains afebrile and has no abdominal pain on palpation or dysuria symptoms  4. Anemia. Hemoccult is not obtained. Despite order given few days ago .  follow hemoglobin level   5. Generalized weakness, physical therapist recommended home health services. However, patient refused, patient's family, however, is interested in placement to skilled nursing facility due to dementia. Psychiatrist got  involved to determine patient's ability to comprehend medical issues he is faced with, psychiatry does not feel that patient has capability to make decisions. Bed search is initiated for placement, patient will be going to assisted living facility possibly tomorrow if bed is available  6. 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 Family decided against further investigation.   7. Dementia, Aricept is initiated, and Aricept dose increased to 10 mg by psychiatry and dr. Weber Cooks signed off  8. Failure to thrive adult, as above, patient's family is interested in placement, getting PPD placed/read  9. Urine with blood clot as reported by RN-urinalysis is ordered Hb is stable at 12.9-12.8    Palliative care consult is pending Placement is pending. Follow-up with social worker   DRUG ALLERGIES: No Known Allergies  CODE STATUS:     Code Status Orders        Start     Ordered   11/15/15 2102  Full code   Continuous     11/15/15 2102    Code Status History    Date Active Date Inactive Code Status Order ID Comments User Context   This patient has a current code status  but no historical code status.      TOTAL TIME TAKING CARE OF THIS PATIENT: 32 minutes.    discussed with care management/social work in regards to discharge planning   Nicholes Mango M.D on 11/21/2015 at 3:54 PM  Between 7am to 6pm - Pager - [phone]  After 6pm go to [email or web address] - password [email or web address] [facility]  [facility] [facility]  Office  [phone]  CC: Primary care physician; [name], MD

## 2015-11-21 NOTE — Plan of Care (Signed)
Problem: Education: Goal: Knowledge of [facility] Education information/materials will improve Outcome: Not Progressing Pt remains confused, refused medications in the evening.   Problem: Safety: Goal: Ability to remain free from injury will improve Outcome: Not Progressing Pt is a high fall risk, impulsive. Frequently gets up to use the restroom. Refuses to allow nurses help him in the bathroom.

## 2015-11-21 NOTE — Consult Note (Signed)
  Psychiatry: Follow-up for this 82 year old man with dementia. Patient interviewed. Chart reviewed. Patient has no new complaints. Says that he is feeling fine. No complaints about his mood. Patient's affect is euthymic but slightly confused. Clearly does not remember me. Obviously still demented.  No change to acute symptoms or recommendation. I will increase the dose of Aricept to 10 mg at night. Otherwise I will sign off at this point. Please reconsult if anything new arises that I can be of help with.

## 2015-11-22 MED ORDER — DUTASTERIDE 0.5 MG PO CAPS: 0.5000 mg | ORAL_CAPSULE | Freq: Every day | ORAL | Status: DC

## 2015-11-22 MED ORDER — DIPHENHYDRAMINE HCL 50 MG/ML IJ SOLN
12.5000 mg | Freq: Once | INTRAMUSCULAR | Status: AC
  Administered 2015-11-22: 05:00:00 12.5 mg via INTRAVENOUS
  Filled 2015-11-22: qty 1

## 2015-11-22 MED ORDER — CHOLESTYRAMINE 4 G PO PACK: 4.0000 g | PACK | Freq: Two times a day (BID) | ORAL | Status: DC

## 2015-11-22 MED ORDER — ACETAMINOPHEN 325 MG PO TABS
650.0000 mg | ORAL_TABLET | Freq: Four times a day (QID) | ORAL | Status: DC | PRN
Start: 2015-11-22 — End: 2016-01-11

## 2015-11-22 MED ORDER — DONEPEZIL HCL 10 MG PO TABS: 10.0000 mg | ORAL_TABLET | Freq: Every day | ORAL | Status: DC

## 2015-11-22 MED ORDER — OXYCODONE HCL 5 MG PO TABS: 5.0000 mg | ORAL_TABLET | ORAL | Status: DC | PRN

## 2015-11-22 MED ORDER — ENSURE ENLIVE PO LIQD: 237.0000 mL | Freq: Three times a day (TID) | ORAL | Status: DC

## 2015-11-22 NOTE — Progress Notes (Signed)
MD making rounds. Discharge orders received. LCSW facilitating paperwork for admission to [facility]. RN spoke with [facility] representative regarding transfer. No unanswered questions. Transported to [facility] via EMS. Belongings sent with family.

## 2015-11-22 NOTE — Care Management Important Message (Signed)
Important Message  Patient Details  Name: [name] MRN: [id number] Date of Birth: 1923-07-22   Medicare Important Message Given:  Yes    Juliann Pulse A Glenn Gullickson 11/22/2015, 11:23 AM

## 2015-11-22 NOTE — Discharge Instructions (Signed)
Activity as tolerated per PT recommendations Regular diet with feeding supplements Follow-up with primary care physician at facility in 2-3 days Follow-up with psychiatry in 2-4 weeks

## 2015-11-22 NOTE — NC FL2 (Signed)
[place] LEVEL OF CARE SCREENING TOOL     IDENTIFICATION  Patient Name: [name] Birthdate: Feb 06, 1923 Sex: male Admission Date (Current Location): 11/15/2015  [place] and [place] Number:  Engineering geologist and Address:  [facility] [address] [place], [place] [address]       Provider Number: [id number]  Attending Physician Name and Address:  Nicholes Mango, MD  Relative Name and Phone Number:       Current Level of Care: Hospital Recommended Level of Care: [place], Memory Care Prior Approval Number:    Date Approved/Denied:   PASRR Number:    Discharge Plan: Domiciliary (Rest home)    Current Diagnoses: Patient Active Problem List   Diagnosis Date Noted  . Protein-calorie malnutrition, severe 11/17/2015  . UTI (lower urinary tract infection) 11/15/2015  . Branch retinal vein occlusion 11/11/2015  . Encounter for wound re-check 11/02/2015  . Fall with injury to face 10/26/2015  . Cataract, post subcapsular polar senile 10/04/2015  . Cataracts, bilateral 10/01/2015  . Essential hypertension 08/27/2015  . Dementia 08/27/2015  . Primary open angle glaucoma 02/17/2015  . Bladder cancer ([place]) 11/17/2013  . Gross hematuria 11/17/2013  . Cataract, nuclear sclerotic senile 07/31/2011  . Dyslipidemia 06/09/2011  . PVC (premature ventricular contraction) 06/09/2011    Orientation RESPIRATION BLADDER Height & Weight     Self  Normal Continent Weight: 155 lb 3.2 oz (70.398 kg) Height:  5\' 6"  (167.6 cm)  BEHAVIORAL SYMPTOMS/MOOD NEUROLOGICAL BOWEL NUTRITION STATUS      Incontinent Diet (Regular Diet)  AMBULATORY STATUS COMMUNICATION OF NEEDS Skin   Supervision Verbally Normal                       Personal Care Assistance Level of Assistance  Bathing, Feeding, Dressing Bathing Assistance: Limited assistance Feeding assistance: Limited assistance Dressing Assistance: Limited assistance      Functional Limitations Info  Sight, Hearing, Speech Sight Info: Impaired Hearing Info: Adequate Speech Info: Impaired    SPECIAL CARE FACTORS FREQUENCY  PT (By licensed PT)     PT Frequency:  (Home Health)              Contractures      Additional Factors Info  Code Status, Allergies Code Status Info: Full Code Allergies Info: No known allergies           Current Medications (11/22/2015):  This is the current hospital active medication list Current Facility-Administered Medications  Medication Dose Route Frequency Provider Last Rate Last Dose  . acetaminophen (TYLENOL) tablet 650 mg  650 mg Oral Q6H PRN Fritzi Mandes, MD   650 mg at 11/16/15 0107   Or  . acetaminophen (TYLENOL) suppository 650 mg  650 mg Rectal Q6H PRN Fritzi Mandes, MD      . brinzolamide (AZOPT) 1 % ophthalmic suspension 1 drop  1 drop Both Eyes BID Fritzi Mandes, MD   1 drop at 11/22/15 1000  . cholestyramine (QUESTRAN) packet 4 g  4 g Oral BID Theodoro Grist, MD   4 g at 11/22/15 1130  . donepezil (ARICEPT) tablet 10 mg  10 mg Oral QHS Gonzella Lex, MD   10 mg at 11/21/15 2049  . dutasteride (AVODART) capsule 0.5 mg  0.5 mg Oral Daily Fritzi Mandes, MD   0.5 mg at 11/22/15 1130  . feeding supplement (ENSURE ENLIVE) (ENSURE ENLIVE) liquid 237 mL  237 mL Oral TID WC Theodoro Grist, MD  237 mL at 11/22/15 1131  . haloperidol lactate (HALDOL) injection 1 mg  1 mg Intravenous Q6H PRN Theodoro Grist, MD   1 mg at 11/21/15 2235  . haloperidol lactate (HALDOL) injection 5 mg  5 mg Intramuscular Once Theodoro Grist, MD   5 mg at 11/16/15 1700  . hydrALAZINE (APRESOLINE) injection 10 mg  10 mg Intravenous Q4H PRN Lance Coon, MD   10 mg at 11/15/15 2231  . latanoprost (XALATAN) 0.005 % ophthalmic solution 1 drop  1 drop Both Eyes QHS Fritzi Mandes, MD   1 drop at 11/17/15 1932  . lisinopril (PRINIVIL,ZESTRIL) tablet 20 mg  20 mg Oral Daily Nicholes Mango, MD   20 mg at 11/22/15 1130  . loperamide (IMODIUM) capsule 2 mg  2 mg  Oral Q6H PRN Theodoro Grist, MD      . LORazepam (ATIVAN) injection 1 mg  1 mg Intramuscular Once Lance Coon, MD   1 mg at 11/17/15 1442  . ondansetron (ZOFRAN) tablet 4 mg  4 mg Oral Q6H PRN Fritzi Mandes, MD       Or  . ondansetron (ZOFRAN) injection 4 mg  4 mg Intravenous Q6H PRN Fritzi Mandes, MD      . oxyCODONE (Oxy IR/ROXICODONE) immediate release tablet 5 mg  5 mg Oral Q4H PRN Fritzi Mandes, MD         Discharge Medications: Please see discharge summary for a list of discharge medications.  Current Discharge Medication List    START taking these medications   Details  acetaminophen (TYLENOL) 325 MG tablet Take 2 tablets (650 mg total) by mouth every 6 (six) hours as needed for mild pain (or Fever >/= 101).    cholestyramine (QUESTRAN) 4 g packet Take 1 packet (4 g total) by mouth 2 (two) times daily. Qty: 60 each, Refills: 0    donepezil (ARICEPT) 10 MG tablet Take 1 tablet (10 mg total) by mouth at bedtime. Qty: 30 tablet, Refills: 0    feeding supplement, ENSURE ENLIVE, (ENSURE ENLIVE) LIQD Take 237 mLs by mouth 3 (three) times daily with meals. Qty: 237 mL, Refills: 12    oxyCODONE (OXY IR/ROXICODONE) 5 MG immediate release tablet Take 1 tablet (5 mg total) by mouth every 4 (four) hours as needed for moderate pain. Qty: 30 tablet, Refills: 0      CONTINUE these medications which have CHANGED   Details  dutasteride (AVODART) 0.5 MG capsule Take 1 capsule (0.5 mg total) by mouth daily. Qty: 30 capsule, Refills: 0      CONTINUE these medications which have NOT CHANGED   Details  brinzolamide (AZOPT) 1 % ophthalmic suspension Place 1 drop into both eyes 2 (two) times daily.    dorzolamide-timolol (COSOPT) 22.3-6.8 MG/ML ophthalmic solution Place 1 drop into the left eye 2 (two) times daily.    latanoprost (XALATAN) 0.005 % ophthalmic solution Place 1 drop into both eyes at bedtime.    lisinopril (PRINIVIL,ZESTRIL) 20 MG tablet Take  20 mg by mouth daily.      STOP taking these medications     doxycycline (VIBRA-TABS) 100 MG tablet         Relevant Imaging Results:  Relevant Lab Results:   Additional Information SSN:  [id number]  Darden Dates, LCSW

## 2015-11-22 NOTE — Discharge Summary (Signed)
[place] at [place] NAME: [name]    MR#:  [id number]  DATE OF BIRTH:  Jul 20, 1923  DATE OF ADMISSION:  11/15/2015 ADMITTING PHYSICIAN: Fritzi Mandes, MD  DATE OF DISCHARGE: 11/22/2015 PRIMARY CARE PHYSICIAN: [name], MD    ADMISSION DIAGNOSIS:  Diarrhea, unspecified type [R19.7]  DISCHARGE DIAGNOSIS:  Unspecified diarrhea Severe protein energy malnutrition Mass of the right thigh Pyuria  SECONDARY DIAGNOSIS:   Past Medical History  Diagnosis Date  . Arrhythmia     pvcs  . Hyperlipidemia   . Cancer ([place])     bladder  . BPH (benign prostatic hyperplasia)   . Hypertension   . PONV (postoperative nausea and vomiting)   . Glaucoma   . Fracture of right talus     diagonosed 12/22/13- last f/u note on chart dated 01/02/14.     HOSPITAL COURSE:   1. Diarrhea of unclear etiology, C. difficile negative, improved with cholestyramine, advanced to twice daily, continue Imodium as needed, better clinically.  Appreciated gastroenterologist input, no further interventions unless weight loss or bleeding.   2. Failure to thrive adult, oral intake is relatively good to 40-100%, calorie count was discontinued, discussed with dietitian  3. Pyuria, likely contamination , as patient's urine culture revealed multiple species , the patient is off Rocephin and followed  for clinical signs of urinary tract infection, remains afebrile and has no abdominal pain on palpation or dysuria symptoms  4. Anemia. Hemoglobin level is stable at 12.8  5. Generalized weakness, physical therapist recommended home health services. However, patient refused, patient's family, however, is interested in placement to skilled nursing facility due to dementia. Psychiatrist got involved to determine patient's ability to comprehend medical issues he is faced with, psychiatry does not feel that patient has capability to make decisions.   6. Right thigh mass of  unclear etiology, concerning for lipoma versus liposarcoma per MRI , appreciate oncologist input , he discussed with family risks as well as benefits of further investigation, . Family decided against further investigation.   7. Dementia, Aricept is initiated, and Aricept dose increased to 10 mg by psychiatry and dr. Weber Cooks signed off  8. Failure to thrive adult, as above, patient's family is interested in placement, getting PPD placed/read  9. Urine with blood clot as reported by RN-urinalysis is ordered Hb is stable at 12.9-12.8   Disposition -skilled nursing facility  DISCHARGE CONDITIONS:   FAIR  CONSULTS OBTAINED:  Treatment Team:  Gonzella Lex, MD Cammie Sickle, MD   PROCEDURES NONE  DRUG ALLERGIES:  No Known Allergies  DISCHARGE MEDICATIONS:   Current Discharge Medication List    START taking these medications   Details  acetaminophen (TYLENOL) 325 MG tablet Take 2 tablets (650 mg total) by mouth every 6 (six) hours as needed for mild pain (or Fever >/= 101).    cholestyramine (QUESTRAN) 4 g packet Take 1 packet (4 g total) by mouth 2 (two) times daily. Qty: 60 each, Refills: 0    donepezil (ARICEPT) 10 MG tablet Take 1 tablet (10 mg total) by mouth at bedtime. Qty: 30 tablet, Refills: 0    feeding supplement, ENSURE ENLIVE, (ENSURE ENLIVE) LIQD Take 237 mLs by mouth 3 (three) times daily with meals. Qty: 237 mL, Refills: 12    oxyCODONE (OXY IR/ROXICODONE) 5 MG immediate release tablet Take 1 tablet (5 mg total) by mouth every 4 (four) hours as needed for moderate pain. Qty: 30 tablet, Refills: 0  CONTINUE these medications which have CHANGED   Details  dutasteride (AVODART) 0.5 MG capsule Take 1 capsule (0.5 mg total) by mouth daily. Qty: 30 capsule, Refills: 0      CONTINUE these medications which have NOT CHANGED   Details  brinzolamide (AZOPT) 1 % ophthalmic suspension Place 1 drop into both eyes 2 (two) times daily.     dorzolamide-timolol (COSOPT) 22.3-6.8 MG/ML ophthalmic solution Place 1 drop into the left eye 2 (two) times daily.    latanoprost (XALATAN) 0.005 % ophthalmic solution Place 1 drop into both eyes at bedtime.    lisinopril (PRINIVIL,ZESTRIL) 20 MG tablet Take 20 mg by mouth daily.      STOP taking these medications     doxycycline (VIBRA-TABS) 100 MG tablet          DISCHARGE INSTRUCTIONS:  Follow-up with primary care physician at the facility in 2-3 days Follow-up with psychiatry in 2-4 weeks  DIET:  Regular diet  DISCHARGE CONDITION:  Fair  ACTIVITY:  Activity as tolerated per PT recommendations  OXYGEN:  Home Oxygen: No.   Oxygen Delivery: room air  DISCHARGE LOCATION:  nursing home   If you experience worsening of your admission symptoms, develop shortness of breath, life threatening emergency, suicidal or homicidal thoughts you must seek medical attention immediately by calling 911 or calling your MD immediately  if symptoms less severe.  You Must read complete instructions/literature along with all the possible adverse reactions/side effects for all the Medicines you take and that have been prescribed to you. Take any new Medicines after you have completely understood and accpet all the possible adverse reactions/side effects.   Please note  You were cared for by a hospitalist during your hospital stay. If you have any questions about your discharge medications or the care you received while you were in the hospital after you are discharged, you can call the unit and asked to speak with the hospitalist on call if the hospitalist that took care of you is not available. Once you are discharged, your primary care physician will handle any further medical issues. Please note that NO REFILLS for any discharge medications will be authorized once you are discharged, as it is imperative that you return to your primary care physician (or establish a relationship with a  primary care physician if you do not have one) for your aftercare needs so that they can reassess your need for medications and monitor your lab values.     Today  Chief Complaint  Patient presents with  . Diarrhea   Patient is resting comfortably. Denies any complaints. Very hard of hearing and poor historian  ROS:  unable to obtain review of system in view of underlying dementia. Patient says fine to all questions  VITAL SIGNS:  Blood pressure 110/58, pulse 80, temperature 97.3 F (36.3 C), temperature source Oral, resp. rate 18, height 5\' 6"  (1.676 m), weight 70.398 kg (155 lb 3.2 oz), SpO2 92 %.  I/O:    Intake/Output Summary (Last 24 hours) at 11/22/15 1253 Last data filed at 11/22/15 0800  Gross per 24 hour  Intake    240 ml  Output      0 ml  Net    240 ml    PHYSICAL EXAMINATION:  GENERAL:  82 y.o.-year-old patient lying in the bed with no acute distress.  EYES: Pupils equal, round, reactive to light and accommodation. No scleral icterus. Extraocular muscles intact.  HEENT: Head atraumatic, normocephalic. Oropharynx and nasopharynx clear.  NECK:  Supple, no jugular venous distention. No thyroid enlargement, no tenderness.  LUNGS: Normal breath sounds bilaterally, no wheezing, rales,rhonchi or crepitation. No use of accessory muscles of respiration.  CARDIOVASCULAR: S1, S2 normal. No murmurs, rubs, or gallops.  ABDOMEN: Soft, non-tender, non-distended. Bowel sounds present. No organomegaly or mass.  EXTREMITIES: No pedal edema, cyanosis, or clubbing. Right thigh medial area mass extending along the major muscle groups of approximately 10 inches over 5 inches over 4 inches in size, firm to palpation but not fluctuating. No overlying skin abnormalities, redness and inflammation of significant pain on palpation NEUROLOGIC: Cranial nerves II through XII are grosslyintact. Muscle strength 5/5 in all extremities. Sensation intact. Gait not checked.  PSYCHIATRIC: The patient  is alert and disoriented  SKIN: No obvious rash, lesion, or ulcer.   DATA REVIEW:   CBC  Recent Labs Lab 11/21/15 0546  WBC 10.2  HGB 12.8*  HCT 37.4*  PLT 202    Chemistries   Recent Labs Lab 11/15/15 1322 11/16/15 0556  NA 141 141  K 3.6 3.9  CL 109 113*  CO2 23 22  GLUCOSE 99 101*  BUN 17 17  CREATININE 1.15 1.12  CALCIUM 8.4* 8.0*  AST 14*  --   ALT 11*  --   ALKPHOS 59  --   BILITOT 1.2  --     Cardiac Enzymes No results for input(s): TROPONINI in the last 168 hours.  Microbiology Results  Results for orders placed or performed during the hospital encounter of 11/15/15  Urine culture     Status: None   Collection Time: 11/15/15  5:28 PM  Result Value Ref Range Status   Specimen Description URINE, RANDOM  Final   Special Requests NONE  Final   Culture MULTIPLE SPECIES PRESENT, SUGGEST RECOLLECTION  Final   Report Status 11/17/2015 FINAL  Final  C difficile quick scan w PCR reflex     Status: None   Collection Time: 11/15/15  5:28 PM  Result Value Ref Range Status   C Diff antigen NEGATIVE NEGATIVE Final   C Diff toxin NEGATIVE NEGATIVE Final   C Diff interpretation Negative for C. difficile  Final    RADIOLOGY:  No results found.  EKG:   Orders placed or performed in visit on 11/27/14  . EKG 12-Lead      Management plans discussed with the patient, family and they are in agreement.  CODE STATUS:     Code Status Orders        Start     Ordered   11/15/15 2102  Full code   Continuous     11/15/15 2102    Code Status History    Date Active Date Inactive Code Status Order ID Comments User Context   This patient has a current code status but no historical code status.      TOTAL TIME TAKING CARE OF THIS PATIENT: 45  minutes.    @[facility] @  on 11/22/2015 at 12:53 PM  Between 7am to 6pm - Pager - [phone]  After 6pm go to [email or web address] - password [email or web address] [facility]  [place] [facility]  Office  [phone]  CC: Primary care  physician; [name], MD

## 2015-11-23 NOTE — Clinical Social Work Note (Signed)
Pt is ready for discharge today to [facility]. Pt has been evaluated and accepted by facility. Pt's stepdaughter, will complete paperwork and pay upon arrival. Facility has received discharge summary and updated FL2, and is ready to admit pt. Pt's stepdaughter is aware and agreeable to discharge plan. EMS will provide transportation. CSW will sign off as no further needs identified.   Darden Dates, MSW, LCSW  Clinical Social Worker  [phone]

## 2015-11-24 ENCOUNTER — Telehealth: Payer: Self-pay

## 2015-11-24 ENCOUNTER — Ambulatory Visit (INDEPENDENT_AMBULATORY_CARE_PROVIDER_SITE_OTHER): Payer: Medicare Other | Admitting: Family Medicine

## 2015-11-24 ENCOUNTER — Encounter: Payer: Self-pay | Admitting: Family Medicine

## 2015-11-24 VITALS — BP 116/74 | HR 91 | Temp 97.3°F | Ht 68.0 in | Wt 150.4 lb

## 2015-11-24 DIAGNOSIS — F0391 Unspecified dementia with behavioral disturbance: Secondary | ICD-10-CM

## 2015-11-24 DIAGNOSIS — Z111 Encounter for screening for respiratory tuberculosis: Secondary | ICD-10-CM | POA: Diagnosis not present

## 2015-11-24 DIAGNOSIS — R197 Diarrhea, unspecified: Secondary | ICD-10-CM | POA: Diagnosis not present

## 2015-11-24 LAB — CBC
HEMATOCRIT: 39.8 % (ref 39.0–52.0)
HEMOGLOBIN: 13.6 g/dL (ref 13.0–17.0)
MCH: 32.1 pg (ref 26.0–34.0)
MCHC: 34.2 g/dL (ref 30.0–36.0)
MCV: 93.9 fL (ref 78.0–100.0)
MPV: 9.9 fL (ref 8.6–12.4)
Platelets: 305 10*3/uL (ref 150–400)
RBC: 4.24 MIL/uL (ref 4.22–5.81)
RDW: 15.8 % — ABNORMAL HIGH (ref 11.5–15.5)
WBC: 12.5 10*3/uL — AB (ref 4.0–10.5)

## 2015-11-24 MED ORDER — SERTRALINE HCL 50 MG PO TABS: 25.0000 mg | ORAL_TABLET | Freq: Every day | ORAL | Status: DC

## 2015-11-24 NOTE — Telephone Encounter (Signed)
Did he get admitted to assisted living or skilled nursing? Did they send orders from his discharge from the hospital? If he is having issues calming down he will need to be seen in the office prior to medication being prescribed.

## 2015-11-24 NOTE — Progress Notes (Signed)
Pre visit review using our clinic review tool, if applicable. No additional management support is needed unless otherwise documented below in the visit note. 

## 2015-11-24 NOTE — Telephone Encounter (Signed)
Vaughan Basta states that [name] was admitted to the assisted living facility on 11/22/15. Pt has been having a hard time adjusting, the facility is requesting something to calm him down and pt is also having diarrhea. Vaughan Basta states pt has already passed stools 6 times today. Please advise

## 2015-11-24 NOTE — Patient Instructions (Signed)
Nice to see you. We are starting you on medicine for depression. I filled out your paperwork. Please get established with the physician at your facility. If he develops abdominal pain, fevers, persistent diarrhea, blood in your stool, or any new or changing symptoms please seek medical attention.

## 2015-11-24 NOTE — Progress Notes (Signed)
Patient ID: [name], male   DOB: 07-29-1923, 82 y.o.   MRN: [id number]  [name], MD Phone: [phone]  [name] is a 82 y.o. male who presents today for same-day visit.  Patient presents for same-day visit following discharge from the hospital. He was in the hospital for diarrhea and dementia. He was subsequently discharged to an assisted living facility. Today he is accompanied by his stepdaughters. They provide most of the history. They state that the patient is not settling down since going in the hospital. This has gotten somewhat worse since he has been at the assisted living facility. He was up all night last night trying to open doors and setting alarms off. He has been somewhat confused, though answers questions appropriately and recognized his stepdaughters. They report he seems depressed and was quite upset that he is taken to the hospital and is quite upset about his current living facility. He has had 6 bowel movements today. Per review of his discharge summary it did appear that his bowel movements were under better control on cholestyramine and Imodium at discharge. On review of his MAR from the assisted living facility appears that he got cholestyramine today. His stepdaughters feel as though his face is mildly yellow. They're quite concerned about his current facility and whether or not it will provide what he needs. One of his stepdaughters is are pursuing power of attorney.  PMH: nonsmoker.   ROS see history of present illness  Objective  Physical Exam Filed Vitals:   11/24/15 1545  BP: 116/74  Pulse: 91  Temp: 97.3 F (36.3 C)    Physical Exam  Constitutional:  Disheveled, alert, will keep eye contact when spoken to, hard of hearing, answers questions appropriately and questions when he is getting to leave, tired-appearing  HENT:  Head: Normocephalic and atraumatic.  Mouth/Throat: Oropharynx is clear and moist.  Face appears minimally jaundiced    Eyes: Conjunctivae are normal. Pupils are equal, round, and reactive to light. No scleral icterus.  Cardiovascular: Normal rate, regular rhythm and normal heart sounds.  Exam reveals no gallop and no friction rub.   No murmur heard. Pulmonary/Chest: Effort normal and breath sounds normal. No respiratory distress. He has no wheezes. He has no rales.  Abdominal: Soft. Bowel sounds are normal. He exhibits no distension. There is no tenderness. There is no rebound and no guarding.  Neurological: He is alert.  5/5 strength in bilateral biceps, triceps, grip, quads, hamstrings, plantar and dorsiflexion, sensation to light touch intact in bilateral UE and LE, slow deliberate gait, 2+ patellar reflexes  Skin: Skin is warm and dry.  No noted jaundice and neck, hands, back, buttocks Area between buttocks appears mildly raw and tender, no induration or fluctuance  Psychiatric:  Affect depressed     Assessment/Plan: Please see individual problem list.  Dementia Patient with dementia. Mood has obviously change since hospitalization and discharged to assisted living facility based on exam and report of stepdaughters. Has been up at night wandering the halls and appears depressed to his stepdaughters. I had a long discussion with stepdaughters and patient regarding his required level of care and advised that he needed an increased level of care. Discussed that some of this change may be related to depression and that he could benefit from treatment of this. Discussed treatment with Seroquel versus SSRI with patient stepdaughters, in particular the stepdaughter that is applying for power of attorney to help determine what the patient would desire. Discussed risks  of each medication. Stepdaughter indicated that the patient would likely not want the Seroquel given the risk of death. We'll treat with Zoloft for depression to see if this will be beneficial. Patient is alert and answering questions appropriately and  does not appear distressed, though does appear tired. Suspect this is likely related to the depression versus mild dehydration, though he does not appear clinically dehydrated. We'll start Zoloft. We'll continue to monitor. Given return precautions.  Diarrhea Patient with continued diarrhea. 6-7 episodes today. He does not clinically appear dehydrated at this time. His pulse is in the normal range. His mucous membranes are moist. It appears he has been getting cholestyramine at his assisted living facility. Unknown if he is getting Imodium. Given the persistent diarrhea we will check his kidney function. Does appear minimally jaundiced in the face, though no scleral icterus or jaundice elsewhere. We will thus check a CMP to evaluate kidney function and LFTs. Also check CBC. He will continue cholestyramine and order given for Imodium. Given the persistent diarrhea I did discuss possible evaluation in the emergency room for fluids, though patient does appear well though does appear tired and thus the decision was made with the stepdaughters and myself to obtain lab work prior to making a decision on further evaluation. If persists or if he becomes dehydrated or develop blood in his stool they will seek medical attention. Given return precautions.   zinc oxide for barrier protection of buttocks.  Orders Placed This Encounter  Procedures  . Comp Met (CMET)  . CBC  . PPD    Order Specific Question:  Has patient ever tested positive?    Answer:  No    Meds ordered this encounter  Medications  . sertraline (ZOLOFT) 50 MG tablet    Sig: Take 0.5 tablets (25 mg total) by mouth daily.    Dispense:  30 tablet    Refill:  3     [name]

## 2015-11-24 NOTE — Telephone Encounter (Signed)
Pt coming in for a office visit

## 2015-11-24 NOTE — Assessment & Plan Note (Addendum)
Patient with dementia. Mood has obviously change since hospitalization and discharged to assisted living facility based on exam and report of stepdaughters. Has been up at night wandering the halls and appears depressed to his stepdaughters. I had a long discussion with stepdaughters and patient regarding his required level of care and advised that he needed an increased level of care. Discussed that some of this change may be related to depression and that he could benefit from treatment of this. Discussed treatment with Seroquel versus SSRI with patient stepdaughters, in particular the stepdaughter that is applying for power of attorney to help determine what the patient would desire. Discussed risks of each medication. Stepdaughter indicated that the patient would likely not want the Seroquel given the risk of death. We'll treat with Zoloft for depression to see if this will be beneficial. Patient is alert and answering questions appropriately and does not appear distressed, though does appear tired. Suspect this is likely related to the depression versus mild dehydration, though he does not appear clinically dehydrated. We'll start Zoloft. We'll continue to monitor. Given return precautions.

## 2015-11-25 ENCOUNTER — Inpatient Hospital Stay
Admission: EM | Admit: 2015-11-25 | Discharge: 2015-12-01 | DRG: 682 | Disposition: A | Discharge status: Home / Self Care | Payer: Medicare Other | Attending: Internal Medicine | Admitting: Internal Medicine

## 2015-11-25 ENCOUNTER — Encounter: Payer: Self-pay | Admitting: Emergency Medicine

## 2015-11-25 ENCOUNTER — Telehealth: Payer: Self-pay | Admitting: Family Medicine

## 2015-11-25 ENCOUNTER — Telehealth: Payer: Self-pay

## 2015-11-25 DIAGNOSIS — Z8551 Personal history of malignant neoplasm of bladder: Secondary | ICD-10-CM | POA: Diagnosis not present

## 2015-11-25 DIAGNOSIS — R197 Diarrhea, unspecified: Secondary | ICD-10-CM | POA: Diagnosis present

## 2015-11-25 DIAGNOSIS — Z66 Do not resuscitate: Secondary | ICD-10-CM | POA: Diagnosis present

## 2015-11-25 DIAGNOSIS — Z803 Family history of malignant neoplasm of breast: Secondary | ICD-10-CM

## 2015-11-25 DIAGNOSIS — Z9889 Other specified postprocedural states: Secondary | ICD-10-CM | POA: Diagnosis not present

## 2015-11-25 DIAGNOSIS — E860 Dehydration: Secondary | ICD-10-CM | POA: Diagnosis present

## 2015-11-25 DIAGNOSIS — R627 Adult failure to thrive: Secondary | ICD-10-CM | POA: Diagnosis present

## 2015-11-25 DIAGNOSIS — N289 Disorder of kidney and ureter, unspecified: Secondary | ICD-10-CM | POA: Diagnosis not present

## 2015-11-25 DIAGNOSIS — E875 Hyperkalemia: Secondary | ICD-10-CM | POA: Diagnosis present

## 2015-11-25 DIAGNOSIS — N170 Acute kidney failure with tubular necrosis: Secondary | ICD-10-CM | POA: Diagnosis not present

## 2015-11-25 DIAGNOSIS — Z823 Family history of stroke: Secondary | ICD-10-CM

## 2015-11-25 DIAGNOSIS — Z79899 Other long term (current) drug therapy: Secondary | ICD-10-CM | POA: Diagnosis not present

## 2015-11-25 DIAGNOSIS — Z8249 Family history of ischemic heart disease and other diseases of the circulatory system: Secondary | ICD-10-CM | POA: Diagnosis not present

## 2015-11-25 DIAGNOSIS — R8290 Unspecified abnormal findings in urine: Secondary | ICD-10-CM | POA: Diagnosis present

## 2015-11-25 DIAGNOSIS — R001 Bradycardia, unspecified: Secondary | ICD-10-CM | POA: Diagnosis present

## 2015-11-25 DIAGNOSIS — Z6823 Body mass index (BMI) 23.0-23.9, adult: Secondary | ICD-10-CM | POA: Diagnosis not present

## 2015-11-25 DIAGNOSIS — E43 Unspecified severe protein-calorie malnutrition: Secondary | ICD-10-CM | POA: Diagnosis present

## 2015-11-25 DIAGNOSIS — N179 Acute kidney failure, unspecified: Secondary | ICD-10-CM | POA: Diagnosis present

## 2015-11-25 DIAGNOSIS — H409 Unspecified glaucoma: Secondary | ICD-10-CM | POA: Diagnosis present

## 2015-11-25 DIAGNOSIS — I10 Essential (primary) hypertension: Secondary | ICD-10-CM | POA: Diagnosis present

## 2015-11-25 DIAGNOSIS — G309 Alzheimer's disease, unspecified: Secondary | ICD-10-CM | POA: Diagnosis present

## 2015-11-25 DIAGNOSIS — F0280 Dementia in other diseases classified elsewhere without behavioral disturbance: Secondary | ICD-10-CM | POA: Diagnosis present

## 2015-11-25 DIAGNOSIS — R2231 Localized swelling, mass and lump, right upper limb: Secondary | ICD-10-CM | POA: Diagnosis not present

## 2015-11-25 LAB — COMPREHENSIVE METABOLIC PANEL
ALBUMIN: 4 g/dL (ref 3.5–5.0)
ALK PHOS: 67 U/L (ref 38–126)
ALT: 37 U/L (ref 9–46)
ALT: 40 U/L (ref 17–63)
ANION GAP: 10 (ref 5–15)
AST: 31 U/L (ref 10–35)
AST: 39 U/L (ref 15–41)
Albumin: 4.4 g/dL (ref 3.6–5.1)
Alkaline Phosphatase: 71 U/L (ref 40–115)
BILIRUBIN TOTAL: 1.1 mg/dL (ref 0.3–1.2)
BUN: 84 mg/dL — AB (ref 7–25)
BUN: 99 mg/dL — AB (ref 6–20)
CALCIUM: 8.4 mg/dL — AB (ref 8.9–10.3)
CHLORIDE: 102 mmol/L (ref 98–110)
CO2: 19 mmol/L — ABNORMAL LOW (ref 20–31)
CO2: 21 mmol/L — ABNORMAL LOW (ref 22–32)
CREATININE: 4.65 mg/dL — AB (ref 0.61–1.24)
Calcium: 8.8 mg/dL (ref 8.6–10.3)
Chloride: 110 mmol/L (ref 101–111)
Creat: 4.51 mg/dL — ABNORMAL HIGH (ref 0.70–1.11)
GFR calc Af Amer: 12 mL/min — ABNORMAL LOW (ref >60)
GFR calc non Af Amer: 10 mL/min — ABNORMAL LOW (ref >60)
GLUCOSE: 95 mg/dL (ref 65–99)
GLUCOSE: 115 mg/dL — AB (ref 65–99)
POTASSIUM: 6.8 mmol/L — AB (ref 3.5–5.3)
Potassium: 4.9 mmol/L (ref 3.5–5.1)
Sodium: 138 mmol/L (ref 135–146)
Sodium: 141 mmol/L (ref 135–145)
TOTAL PROTEIN: 7.3 g/dL (ref 6.1–8.1)
TOTAL PROTEIN: 7.2 g/dL (ref 6.5–8.1)
Total Bilirubin: 1.4 mg/dL — ABNORMAL HIGH (ref 0.2–1.2)

## 2015-11-25 LAB — CBC WITH DIFFERENTIAL/PLATELET
BASOS ABS: 0 10*3/uL (ref 0–0.1)
BASOS PCT: 0 %
Eosinophils Absolute: 0.1 10*3/uL (ref 0–0.7)
Eosinophils Relative: 1 %
HEMATOCRIT: 38.5 % — AB (ref 40.0–52.0)
HEMOGLOBIN: 12.9 g/dL — AB (ref 13.0–18.0)
LYMPHS PCT: 13 %
Lymphs Abs: 1.1 10*3/uL (ref 1.0–3.6)
MCH: 31.6 pg (ref 26.0–34.0)
MCHC: 33.4 g/dL (ref 32.0–36.0)
MCV: 94.4 fL (ref 80.0–100.0)
MONO ABS: 0.8 10*3/uL (ref 0.2–1.0)
Monocytes Relative: 10 %
NEUTROS ABS: 6.2 10*3/uL (ref 1.4–6.5)
NEUTROS PCT: 76 %
Platelets: 208 10*3/uL (ref 150–440)
RBC: 4.08 MIL/uL — AB (ref 4.40–5.90)
RDW: 16.3 % — AB (ref 11.5–14.5)
WBC: 8.2 10*3/uL (ref 3.8–10.6)

## 2015-11-25 LAB — URINALYSIS COMPLETE WITH MICROSCOPIC (ARMC ONLY)
BILIRUBIN URINE: NEGATIVE
Glucose, UA: NEGATIVE mg/dL
KETONES UR: NEGATIVE mg/dL
LEUKOCYTES UA: NEGATIVE
NITRITE: NEGATIVE
PH: 5 (ref 5.0–8.0)
Protein, ur: 30 mg/dL — AB
SPECIFIC GRAVITY, URINE: 1.014 (ref 1.005–1.030)

## 2015-11-25 MED ORDER — DEXTROSE-NACL 5-0.9 % IV SOLN
INTRAVENOUS | Status: DC
  Administered 2015-11-25 – 2015-11-27 (×5): via INTRAVENOUS

## 2015-11-25 MED ORDER — SODIUM CHLORIDE 0.9 % IV BOLUS (SEPSIS)
1000.0000 mL | Freq: Once | INTRAVENOUS | Status: AC
  Administered 2015-11-25: 1000 mL via INTRAVENOUS

## 2015-11-25 MED ORDER — DONEPEZIL HCL 5 MG PO TABS
10.0000 mg | ORAL_TABLET | Freq: Every day | ORAL | Status: DC
  Administered 2015-11-25 – 2015-11-30 (×3): 10 mg via ORAL
  Filled 2015-11-25 (×6): qty 2

## 2015-11-25 MED ORDER — CHOLESTYRAMINE 4 G PO PACK
4.0000 g | PACK | Freq: Two times a day (BID) | ORAL | Status: DC
  Administered 2015-11-25 – 2015-12-01 (×4): 4 g via ORAL
  Filled 2015-11-25 (×13): qty 1

## 2015-11-25 MED ORDER — ENSURE ENLIVE PO LIQD
237.0000 mL | Freq: Three times a day (TID) | ORAL | Status: DC
  Administered 2015-11-26 – 2015-11-29 (×8): 237 mL via ORAL

## 2015-11-25 MED ORDER — OXYCODONE HCL 5 MG PO TABS: 5.0000 mg | ORAL_TABLET | ORAL | Status: DC | PRN

## 2015-11-25 MED ORDER — DOCUSATE SODIUM 100 MG PO CAPS
100.0000 mg | ORAL_CAPSULE | Freq: Two times a day (BID) | ORAL | Status: DC
  Administered 2015-11-25: 100 mg via ORAL
  Filled 2015-11-25 (×2): qty 1

## 2015-11-25 MED ORDER — SODIUM CHLORIDE 0.9 % IV BOLUS (SEPSIS)
500.0000 mL | Freq: Once | INTRAVENOUS | Status: AC
  Administered 2015-11-25: 500 mL via INTRAVENOUS

## 2015-11-25 MED ORDER — ONDANSETRON HCL 4 MG/2ML IJ SOLN: 4.0000 mg | Freq: Four times a day (QID) | INTRAMUSCULAR | Status: DC | PRN

## 2015-11-25 MED ORDER — SERTRALINE HCL 50 MG PO TABS
25.0000 mg | ORAL_TABLET | Freq: Every day | ORAL | Status: DC
  Administered 2015-11-26 – 2015-12-01 (×4): 25 mg via ORAL
  Filled 2015-11-25 (×8): qty 1

## 2015-11-25 MED ORDER — ACETAMINOPHEN 325 MG PO TABS
650.0000 mg | ORAL_TABLET | Freq: Four times a day (QID) | ORAL | Status: DC | PRN
  Administered 2015-11-30: 650 mg via ORAL
  Filled 2015-11-25: qty 2

## 2015-11-25 MED ORDER — DORZOLAMIDE HCL-TIMOLOL MAL 2-0.5 % OP SOLN
1.0000 [drp] | Freq: Two times a day (BID) | OPHTHALMIC | Status: DC
  Administered 2015-11-25 – 2015-11-28 (×7): 1 [drp] via OPHTHALMIC
  Filled 2015-11-25: qty 10

## 2015-11-25 MED ORDER — HEPARIN SODIUM (PORCINE) 5000 UNIT/ML IJ SOLN
5000.0000 [IU] | Freq: Three times a day (TID) | INTRAMUSCULAR | Status: DC
  Administered 2015-11-25 – 2015-11-29 (×12): 5000 [IU] via SUBCUTANEOUS
  Filled 2015-11-25 (×12): qty 1

## 2015-11-25 MED ORDER — ONDANSETRON HCL 4 MG PO TABS: 4.0000 mg | ORAL_TABLET | Freq: Four times a day (QID) | ORAL | Status: DC | PRN

## 2015-11-25 MED ORDER — BRINZOLAMIDE 1 % OP SUSP
1.0000 [drp] | Freq: Two times a day (BID) | OPHTHALMIC | Status: DC
  Administered 2015-11-25 – 2015-11-28 (×6): 1 [drp] via OPHTHALMIC
  Filled 2015-11-25: qty 10

## 2015-11-25 MED ORDER — LATANOPROST 0.005 % OP SOLN
1.0000 [drp] | Freq: Every day | OPHTHALMIC | Status: DC
Start: 2015-11-25 — End: 2015-12-01
  Administered 2015-11-25 – 2015-11-28 (×4): 1 [drp] via OPHTHALMIC
  Filled 2015-11-25: qty 2.5

## 2015-11-25 MED ORDER — ACETAMINOPHEN 650 MG RE SUPP: 650.0000 mg | Freq: Four times a day (QID) | RECTAL | Status: DC | PRN

## 2015-11-25 MED ORDER — SODIUM CHLORIDE 0.9% FLUSH
3.0000 mL | Freq: Two times a day (BID) | INTRAVENOUS | Status: DC
  Administered 2015-11-25 – 2015-11-29 (×3): 3 mL via INTRAVENOUS

## 2015-11-25 MED ORDER — DUTASTERIDE 0.5 MG PO CAPS
0.5000 mg | ORAL_CAPSULE | Freq: Every day | ORAL | Status: DC
  Administered 2015-12-01: 0.5 mg via ORAL
  Filled 2015-11-25 (×7): qty 1

## 2015-11-25 MED ORDER — ACETAMINOPHEN 325 MG PO TABS: 650.0000 mg | ORAL_TABLET | Freq: Four times a day (QID) | ORAL | Status: DC | PRN

## 2015-11-25 NOTE — ED Notes (Signed)
Report given to [employer].

## 2015-11-25 NOTE — ED Notes (Signed)
Pt found trying to get out of bed, pt ripped off wires and gown. Pt also ripped off IV, bleeding controlled. Pt placed on bed alarm, new socks and gown given. Pt also given posey vest and ice cream per request. Pt sitting up in bed tolerating well. No acute distress noted.

## 2015-11-25 NOTE — Assessment & Plan Note (Addendum)
Patient with continued diarrhea. 6-7 episodes today. He does not clinically appear dehydrated at this time. His pulse is in the normal range. His mucous membranes are moist. It appears he has been getting cholestyramine at his assisted living facility. Unknown if he is getting Imodium. Given the persistent diarrhea we will check his kidney function. Does appear minimally jaundiced in the face, though no scleral icterus or jaundice elsewhere. We will thus check a CMP to evaluate kidney function and LFTs. Also check CBC. He will continue cholestyramine and order given for Imodium. Given the persistent diarrhea I did discuss possible evaluation in the emergency room for fluids, though patient does appear well though does appear tired and thus the decision was made with the stepdaughters and myself to obtain lab work prior to making a decision on further evaluation. If persists or if he becomes dehydrated or develop blood in his stool they will seek medical attention. Given return precautions.

## 2015-11-25 NOTE — ED Notes (Signed)
Lab called regarding urine culture add on, will add on at this time  

## 2015-11-25 NOTE — Telephone Encounter (Signed)
Pt is being admitted to [facility] today and daughter stated that she can not get Mr. [name] medications filled because she does not have guardianship over [name]. Pt's daughter states that she need a letter for work covering her to be off due to the immediate issues with her father and a letter of guardianship. I asked her if she could send or fax over FMLA papers for Dr. Caryl Bis to fill out. Please advise

## 2015-11-25 NOTE — Telephone Encounter (Signed)
error 

## 2015-11-25 NOTE — H&P (Signed)
[facility] NAME: [name]    MR#:  [id number]  DATE OF BIRTH:  [birth year]  DATE OF ADMISSION:  11/25/2015  PRIMARY CARE PHYSICIAN: [name], MD   REQUESTING/REFERRING PHYSICIAN: Marcelene Butte  CHIEF COMPLAINT:  Abnormal labs  HISTORY OF PRESENT ILLNESS:  [name]  is a 82 y.o. male with a known history of dementia, bladder cancer, hyperlipidemia, glaucoma and hypertension and other medical problems was just recently discharged from the hospital after treating for diarrhea, severe protein energy malnutrition to an assisted living facility. Family members think that patient is depressed and not drinking enough, seen by primary care physician yesterday and lab work was done. PCP send the patient to the hospital for abnormal labs today. Patient's creatinine is elevated to 4.65 and BUN is at 99. Hospitalist team is called to admit the patient and patient has received 1500 cc IV fluid bolus in the ED. Patient's sister and sister-in-law were at bedside  PAST MEDICAL HISTORY:   Past Medical History  Diagnosis Date  . Arrhythmia     pvcs  . Hyperlipidemia   . Cancer ([place])     bladder  . BPH (benign prostatic hyperplasia)   . Hypertension   . PONV (postoperative nausea and vomiting)   . Glaucoma   . Fracture of right talus     diagonosed 12/22/13- last f/u note on chart dated 01/02/14.     PAST SURGICAL HISTOIRY:   Past Surgical History  Procedure Laterality Date  . Bladder surgery  4 or 5 years ago    "cut bladder cancer out"  . Tonsillectomy and adenoidectomy  as child  . Cystoscopy/retrograde/ureteroscopy Bilateral 11/17/2013    Procedure: CYSTOSCOPY, bilateral /RETROGRADE/ right URETEROSCOPY, right stent, bladder fulgeration;  Surgeon: Bernestine Amass, MD;  Location: [facility];  Service: Urology;  Laterality: Bilateral;  . Cystoscopy with retrograde pyelogram, ureteroscopy and stent placement Bilateral 01/12/2014     Procedure: CYSTOSCOPY WITH BILATERAL RETROGRADE PYELOGRAM/URETERAL STENT REMOVAL, URETEROSCOPY, LEFT  AND  BLADDER BIOPSY;  Surgeon: Bernestine Amass, MD;  Location: [facility];  Service: Urology;  Laterality: Bilateral;    SOCIAL HISTORY:   Social History  Substance Use Topics  . Smoking status: Never Smoker   . Smokeless tobacco: Never Used  . Alcohol Use: No     Comment: rare    FAMILY HISTORY:   Family History  Problem Relation Age of Onset  . Heart attack Father 71    deceased from mi  . Hypertension Father   . Stroke Father   . Breast cancer Mother 30    breast cancer  . Cancer Mother     DRUG ALLERGIES:  No Known Allergies  REVIEW OF SYSTEMS:  Review of system unobtainable as the patient is a poor historian with baseline dementia  MEDICATIONS AT HOME:   Prior to Admission medications   Medication Sig Start Date End Date Taking? Authorizing Provider  acetaminophen (TYLENOL) 325 MG tablet Take 2 tablets (650 mg total) by mouth every 6 (six) hours as needed for mild pain (or Fever >/= 101). 11/22/15  Yes Nicholes Mango, MD  brinzolamide (AZOPT) 1 % ophthalmic suspension Place 1 drop into both eyes 2 (two) times daily.   Yes Historical Provider, MD  cholestyramine (QUESTRAN) 4 g packet Take 1 packet (4 g total) by mouth 2 (two) times daily. 11/22/15  Yes Nicholes Mango, MD  donepezil (ARICEPT) 10 MG tablet Take 1 tablet (10 mg total)  by mouth at bedtime. 11/22/15  Yes Nicholes Mango, MD  dorzolamide-timolol (COSOPT) 22.3-6.8 MG/ML ophthalmic solution Place 1 drop into the left eye 2 (two) times daily.   Yes Historical Provider, MD  dutasteride (AVODART) 0.5 MG capsule Take 1 capsule (0.5 mg total) by mouth daily. 11/22/15  Yes Nicholes Mango, MD  feeding supplement, ENSURE ENLIVE, (ENSURE ENLIVE) LIQD Take 237 mLs by mouth 3 (three) times daily with meals. 11/22/15  Yes Nicholes Mango, MD  latanoprost (XALATAN) 0.005 % ophthalmic solution Place 1 drop into both eyes at bedtime.   Yes Historical  Provider, MD  lisinopril (PRINIVIL,ZESTRIL) 20 MG tablet Take 20 mg by mouth daily.   Yes Historical Provider, MD  oxyCODONE (OXY IR/ROXICODONE) 5 MG immediate release tablet Take 1 tablet (5 mg total) by mouth every 4 (four) hours as needed for moderate pain. 11/22/15  Yes Nicholes Mango, MD  sertraline (ZOLOFT) 50 MG tablet Take 0.5 tablets (25 mg total) by mouth daily. 11/24/15  Yes Leone Haven, MD      VITAL SIGNS:  Blood pressure 135/83, pulse 53, temperature 98 F (36.7 C), temperature source Oral, resp. rate 16, height 5\' 11"  (1.803 m), weight 76.658 kg (169 lb), SpO2 99 %.  PHYSICAL EXAMINATION:  GENERAL:  82 y.o.-year-old patient lying in the bed with no acute distress.  EYES: Pupils equal, round, reactive to light and accommodation. No scleral icterus. HEENT: Head atraumatic, normocephalic. Oropharynx and nasopharynx clear. Severely dried  mucous membranes NECK:  Supple, no jugular venous distention. No thyroid enlargement, no tenderness.  LUNGS: Normal breath sounds bilaterally, no wheezing, rales,rhonchi or crepitation. No use of accessory muscles of respiration.  CARDIOVASCULAR: S1, S2 normal. No murmurs, rubs, or gallops.  ABDOMEN: Soft, nontender, nondistended. Bowel sounds present. No organomegaly or mass.  EXTREMITIES: Chronic right thigh mass, No pedal edema, cyanosis, or clubbing.  NEUROLOGIC: He has chronic dementia, poor historian  PSYCHIATRIC: The patient is lethargic but arousable to verbal commands has chronic dementia SKIN: No obvious rash, lesion, or ulcer.   LABORATORY PANEL:   CBC  Recent Labs Lab 11/25/15 0942  WBC 8.2  HGB 12.9*  HCT 38.5*  PLT 208   ------------------------------------------------------------------------------------------------------------------  Chemistries   Recent Labs Lab 11/25/15 0942  NA 141  K 4.9  CL 110  CO2 21*  GLUCOSE 115*  BUN 99*  CREATININE 4.65*  CALCIUM 8.4*  AST 39  ALT 40  ALKPHOS 67  BILITOT 1.1    ------------------------------------------------------------------------------------------------------------------  Cardiac Enzymes No results for input(s): TROPONINI in the last 168 hours. ------------------------------------------------------------------------------------------------------------------  RADIOLOGY:  No results found.  EKG:   Orders placed or performed during the hospital encounter of 11/25/15  . EKG 12-Lead  . EKG 12-Lead  . EKG 12-Lead  . EKG 12-Lead    IMPRESSION AND PLAN:   1. Acute kidney injury-prerenal from severe dehydration Admit to MedSurg unit Aggressive hydration with IV fluids, 1500 cc bolus was given in the ED and will continue IV fluids for the next 2 days and monitor renal function closely. Monitor strict intake and output Avoid nephrotoxins  consider nephrology consult if no clinical improvement with IV fluids  2. History of bladder cancer and abnormal urinalysis We will get urine culture and sensitivity. Patient is not febrile and no leukocytosis, not considering IV antibiotics at this time  3. Chronic history of diarrhea-extensive workup was done during the previous admission. C. difficile was negative. Evaluated by gastroenterology and they have recommended cholestyramine, will continue the same  4.6.  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 Family decided not to pursue further, outpatient follow-up with Dr. Burlene Arnt  as needed  7. Dementia with possible depression patient was seen by Dr. Weber Cooks during previous admission and started on Aricept. Will continue the same.  Consult psychiatry  8. Failure to thrive adult with poor by mouth intake-encourage by mouth fluid intake Palliative care consult is placed     All the records are reviewed and case  discussed with ED provider. Management plans discussed with the patient, family and they are in agreement.  CODE STATUS: Full code, daughter  Opal Sidles is the healthcare power of attorney   TOTAL TIME TAKING CARE OF THIS PATIENT: 45  minutes.    Nicholes Mango M.D on 11/25/2015 at 1:02 PM  Between 7am to 6pm - Pager - [phone]  After 6pm go to [email or web address] - password [email or web address] [facility]  [facility]  Office  [phone]  CC: Primary care physician; [name], MD

## 2015-11-25 NOTE — ED Provider Notes (Addendum)
Time Seen: Approximately 10 AM  I have reviewed the triage notes  Chief Complaint: Abnormal Lab   History of Present Illness: [name] is a 82 y.o. male *who is referred to emergency department after routine evaluation of laboratory work showed some "abnormal labs". Patient himself is unable to offer any significant history or review of systems. He is somewhat somnolent and nonverbal at this point. Records show that he's had a recent episodes of decreased appetite and diarrhea with a history of dementia. Review of the laboratory work performed yesterday and in the medical charting system shows some hyperkalemia along with renal insufficiency.   Past Medical History  Diagnosis Date  . Arrhythmia     pvcs  . Hyperlipidemia   . Cancer ([place])     bladder  . BPH (benign prostatic hyperplasia)   . Hypertension   . PONV (postoperative nausea and vomiting)   . Glaucoma   . Fracture of right talus     diagonosed 12/22/13- last f/u note on chart dated 01/02/14.     Patient Active Problem List   Diagnosis Date Noted  . Diarrhea 11/25/2015  . Acute kidney injury ([place]) 11/25/2015  . Protein-calorie malnutrition, severe 11/17/2015  . UTI (lower urinary tract infection) 11/15/2015  . Branch retinal vein occlusion 11/11/2015  . Encounter for wound re-check 11/02/2015  . Fall with injury to face 10/26/2015  . Cataract, post subcapsular polar senile 10/04/2015  . Cataracts, bilateral 10/01/2015  . Essential hypertension 08/27/2015  . Dementia 08/27/2015  . Primary open angle glaucoma 02/17/2015  . Bladder cancer ([place]) 11/17/2013  . Gross hematuria 11/17/2013  . Cataract, nuclear sclerotic senile 07/31/2011  . Dyslipidemia 06/09/2011  . PVC (premature ventricular contraction) 06/09/2011    Past Surgical History  Procedure Laterality Date  . Bladder surgery  4 or 5 years ago    "cut bladder cancer out"  . Tonsillectomy and adenoidectomy  as child  .  Cystoscopy/retrograde/ureteroscopy Bilateral 11/17/2013    Procedure: CYSTOSCOPY, bilateral /RETROGRADE/ right URETEROSCOPY, right stent, bladder fulgeration;  Surgeon: Bernestine Amass, MD;  Location: [facility];  Service: Urology;  Laterality: Bilateral;  . Cystoscopy with retrograde pyelogram, ureteroscopy and stent placement Bilateral 01/12/2014    Procedure: CYSTOSCOPY WITH BILATERAL RETROGRADE PYELOGRAM/URETERAL STENT REMOVAL, URETEROSCOPY, LEFT  AND  BLADDER BIOPSY;  Surgeon: Bernestine Amass, MD;  Location: [facility];  Service: Urology;  Laterality: Bilateral;    Past Surgical History  Procedure Laterality Date  . Bladder surgery  4 or 5 years ago    "cut bladder cancer out"  . Tonsillectomy and adenoidectomy  as child  . Cystoscopy/retrograde/ureteroscopy Bilateral 11/17/2013    Procedure: CYSTOSCOPY, bilateral /RETROGRADE/ right URETEROSCOPY, right stent, bladder fulgeration;  Surgeon: Bernestine Amass, MD;  Location: [facility];  Service: Urology;  Laterality: Bilateral;  . Cystoscopy with retrograde pyelogram, ureteroscopy and stent placement Bilateral 01/12/2014    Procedure: CYSTOSCOPY WITH BILATERAL RETROGRADE PYELOGRAM/URETERAL STENT REMOVAL, URETEROSCOPY, LEFT  AND  BLADDER BIOPSY;  Surgeon: Bernestine Amass, MD;  Location: [facility];  Service: Urology;  Laterality: Bilateral;    Current Outpatient Rx  Name  Route  Sig  Dispense  Refill  . acetaminophen (TYLENOL) 325 MG tablet   Oral   Take 2 tablets (650 mg total) by mouth every 6 (six) hours as needed for mild pain (or Fever >/= 101).         . brinzolamide (AZOPT) 1 % ophthalmic suspension   Both Eyes   Place 1  drop into both eyes 2 (two) times daily.         . cholestyramine (QUESTRAN) 4 g packet   Oral   Take 1 packet (4 g total) by mouth 2 (two) times daily.   60 each   0   . donepezil (ARICEPT) 10 MG tablet   Oral   Take 1 tablet (10 mg total) by mouth at bedtime.   30 tablet   0   . dorzolamide-timolol (COSOPT) 22.3-6.8  MG/ML ophthalmic solution   Left Eye   Place 1 drop into the left eye 2 (two) times daily.         [name] dutasteride (AVODART) 0.5 MG capsule   Oral   Take 1 capsule (0.5 mg total) by mouth daily.   30 capsule   0   . feeding supplement, ENSURE ENLIVE, (ENSURE ENLIVE) LIQD   Oral   Take 237 mLs by mouth 3 (three) times daily with meals.   237 mL   12   . latanoprost (XALATAN) 0.005 % ophthalmic solution   Both Eyes   Place 1 drop into both eyes at bedtime.         [name] lisinopril (PRINIVIL,ZESTRIL) 20 MG tablet   Oral   Take 20 mg by mouth daily.         [name] oxyCODONE (OXY IR/ROXICODONE) 5 MG immediate release tablet   Oral   Take 1 tablet (5 mg total) by mouth every 4 (four) hours as needed for moderate pain.   30 tablet   0   . sertraline (ZOLOFT) 50 MG tablet   Oral   Take 0.5 tablets (25 mg total) by mouth daily.   30 tablet   3     Allergies:  Review of patient's allergies indicates no known allergies.  Family History: Family History  Problem Relation Age of Onset  . Heart attack Father 60    deceased from mi  . Hypertension Father   . Stroke Father   . Breast cancer Mother 66    breast cancer  . Cancer Mother     Social History: Social History  Substance Use Topics  . Smoking status: Never Smoker   . Smokeless tobacco: Never Used  . Alcohol Use: No     Comment: rare     Review of Systems:   10 point review of systems was performed and was otherwise negative:  Constitutional: No fever Eyes: No visual disturbances ENT: No sore throat, ear pain Cardiac: No chest pain Respiratory: No shortness of breath, wheezing, or stridor Abdomen: No abdominal pain, no vomiting, No diarrhea Endocrine: No weight loss, No night sweats Extremities: No peripheral edema, cyanosis Skin: No rashes, easy bruising Neurologic: No focal weakness, trouble with speech or swollowing Urologic: No dysuria, Hematuria, or urinary frequency *History and review of systems  was acquired from the medical record along with EMS personnel and nursing home personnel Physical Exam:  ED Triage Vitals  Enc Vitals Group     BP 11/25/15 0926 128/72 mmHg     Pulse Rate 11/25/15 0926 67     Resp 11/25/15 0926 18     Temp 11/25/15 0926 98 F (36.7 C)     Temp Source 11/25/15 0926 Oral     SpO2 11/25/15 0926 97 %     Weight 11/25/15 0926 169 lb (76.658 kg)     Height 11/25/15 0926 5\' 11"  (1.803 m)     Head Cir --      Peak Flow --  Pain Score 11/25/15 1211 0     Pain Loc --      Pain Edu? --      Excl. in [place]? --     General: Awake , Alert , times one. Glasgow Coma Scale 13 Head: Normal cephalic , atraumatic Eyes: Pupils equal , round, reactive to light Nose/Throat: No nasal drainage, patent upper airway without erythema or exudate. Dry mucous membranes Neck: Supple, Full range of motion, No anterior adenopathy or palpable thyroid masses Lungs: Clear to ascultation without wheezes , rhonchi, or rales Heart: Regular rate, regular rhythm without murmurs , gallops , or rubs Abdomen: Mild tenderness lower middle quadrant without rebound, guarding , or rigidity; bowel sounds positive and symmetric in all 4 quadrants. No organomegaly .       No peritoneal signs Extremities: 2 plus symmetric pulses. No edema, clubbing or cyanosis Neurologic: , Motor symmetric without deficits, sensory intact Skin: warm, dry, no rashes  EKG: ED ECG REPORT I, Daymon Larsen, the attending physician, personally viewed and interpreted this ECG.  Date: 11/25/2015 EKG Time: 0 941 Rate: 62 Rhythm: normal sinus rhythm QRS Axis: normal Intervals: Early right bundle-branch block pattern ST/T Wave abnormalities: normal Conduction Disturbances: none Narrative Interpretation: unremarkable No acute ischemic changes Labs:   All laboratory work was reviewed including any pertinent negatives or positives listed below:  Labs Reviewed  COMPREHENSIVE METABOLIC PANEL - Abnormal; Notable  for the following:    CO2 21 (*)    Glucose, Bld 115 (*)    BUN 99 (*)    Creatinine, Ser 4.65 (*)    Calcium 8.4 (*)    GFR calc non Af Amer 10 (*)    GFR calc Af Amer 12 (*)    All other components within normal limits  CBC WITH DIFFERENTIAL/PLATELET - Abnormal; Notable for the following:    RBC 4.08 (*)    Hemoglobin 12.9 (*)    HCT 38.5 (*)    RDW 16.3 (*)    All other components within normal limits  URINALYSIS COMPLETEWITH MICROSCOPIC ([facility] ONLY) - Abnormal; Notable for the following:    Color, Urine YELLOW (*)    APPearance CLOUDY (*)    Hgb urine dipstick 3+ (*)    Protein, ur 30 (*)    Bacteria, UA RARE (*)    Squamous Epithelial / LPF 0-5 (*)    All other components within normal limits  URINE CULTURE   reviewed the patient's laboratory work shows significant dehydration with prerenal findings of a BUN of 99 and creatinine of 4.65. Patient had bedside ultrasound which showed no significant bladder scan findings   ED Course: The patient's potassium appears to be within normal limits and I felt yesterday his hyperkalemia was most likely a lab error. He does have significant elevation of both the BUN and creatinine levels which indicates of prerenal insufficiency and the patient was started on IV hydration therapy. He does appear to be somewhat more alert on repeat examination and able to follow some commands though he still nonverbal. Patient's case was reviewed with the hospitalist team, further disposition and management depends upon their evaluation. He remains hemodynamically stable    Assessment:  Renal insufficiency  Final Clinical Impression:  Final diagnoses:  Renal insufficiency     Plan: Inpatient management           Daymon Larsen, MD 11/25/15 1700  Daymon Larsen, MD 12/12/15 [phone]

## 2015-11-25 NOTE — Telephone Encounter (Signed)
Called and spoke with patient's stepdaughter and informed her of his lab values and that he needs to go to the emergency room for evaluation. She voiced understanding. We will call his assisted living facility and let them know he needs transport by EMS to the ED for evaluation. CMA called charge nurse to inform them that patient was being sent to ED for evaluation of elevated creatinine and potassium.

## 2015-11-25 NOTE — Telephone Encounter (Signed)
Spoke with patients daughter and she needs a letter filled out stating her fathers condition so that she can take it to an attorney or judge to get guardianship of her father. She is also faxing over [facility] for you to fill out.

## 2015-11-25 NOTE — ED Notes (Signed)
Pt to ed via [facility] from [facility] for evaluation of abnormal labs. Pt was d/c'd from here on Monday, dx with UTI and diarrhea. Pt had labs rechecked yesterday and potassium and kidney levels were high. Ems reports vss.

## 2015-11-26 LAB — COMPREHENSIVE METABOLIC PANEL
ALK PHOS: 57 U/L (ref 38–126)
ALT: 34 U/L (ref 17–63)
AST: 30 U/L (ref 15–41)
Albumin: 3.5 g/dL (ref 3.5–5.0)
Anion gap: 8 (ref 5–15)
BUN: 79 mg/dL — AB (ref 6–20)
CALCIUM: 8.3 mg/dL — AB (ref 8.9–10.3)
CHLORIDE: 115 mmol/L — AB (ref 101–111)
CO2: 20 mmol/L — AB (ref 22–32)
CREATININE: 2.52 mg/dL — AB (ref 0.61–1.24)
GFR calc Af Amer: 24 mL/min — ABNORMAL LOW (ref >60)
GFR calc non Af Amer: 21 mL/min — ABNORMAL LOW (ref >60)
GLUCOSE: 119 mg/dL — AB (ref 65–99)
Potassium: 4.8 mmol/L (ref 3.5–5.1)
SODIUM: 143 mmol/L (ref 135–145)
Total Bilirubin: 1 mg/dL (ref 0.3–1.2)
Total Protein: 6.4 g/dL — ABNORMAL LOW (ref 6.5–8.1)

## 2015-11-26 LAB — CBC
HCT: 35.6 % — ABNORMAL LOW (ref 40.0–52.0)
Hemoglobin: 11.9 g/dL — ABNORMAL LOW (ref 13.0–18.0)
MCH: 31.3 pg (ref 26.0–34.0)
MCHC: 33.4 g/dL (ref 32.0–36.0)
MCV: 93.7 fL (ref 80.0–100.0)
PLATELETS: 176 10*3/uL (ref 150–440)
RBC: 3.79 MIL/uL — ABNORMAL LOW (ref 4.40–5.90)
RDW: 16.1 % — AB (ref 11.5–14.5)
WBC: 5.6 10*3/uL (ref 3.8–10.6)

## 2015-11-26 MED ORDER — TUBERCULIN PPD 5 UNIT/0.1ML ID SOLN
5.0000 [IU] | Freq: Once | INTRADERMAL | Status: AC
  Administered 2015-11-26: 5 [IU] via INTRADERMAL
  Filled 2015-11-26: qty 0.1

## 2015-11-26 MED ORDER — MEGESTROL ACETATE 400 MG/10ML PO SUSP
400.0000 mg | Freq: Two times a day (BID) | ORAL | Status: DC
  Administered 2015-11-26 – 2015-12-01 (×4): 400 mg via ORAL
  Filled 2015-11-26 (×12): qty 10

## 2015-11-26 MED ORDER — LORAZEPAM 2 MG/ML IJ SOLN
0.5000 mg | Freq: Once | INTRAMUSCULAR | Status: AC
  Administered 2015-11-26: 0.5 mg via INTRAVENOUS
  Filled 2015-11-26: qty 1

## 2015-11-26 MED ORDER — LORAZEPAM 2 MG/ML IJ SOLN
0.5000 mg | Freq: Once | INTRAMUSCULAR | Status: DC
  Filled 2015-11-26: qty 1

## 2015-11-26 NOTE — Progress Notes (Signed)
Speech therapy say patient and suggested that he be seen by dietician. MD aware, will order a consult to dietician.

## 2015-11-26 NOTE — Progress Notes (Signed)
According to CSW, pt had a tuberculin PPD test administered during last admission, but it was not read. I called MD and he is agreeable to administering test again. Order has been placed along with an order to read test results 48-72 hours after test administration.

## 2015-11-26 NOTE — Progress Notes (Signed)
Patient's family came to visit and stated that patient had been given a PPD test on 11/24/15 at his PCP. As we were uninformed of this, a PPD test was administered today in the left forearm. Attempted to contact PCP so that we could be told which arm the test was placed so that it could be read by a nurse here, but as it is after 1700 no one answered the phone when called. MD made aware of this information. Orders have been placed for nursing to check the patient's Left Forearm on 2/5 after 1400 to read the skin test. No additional orders at this time.

## 2015-11-26 NOTE — Clinical Social Work Note (Signed)
Clinical Social Work Assessment  Patient Details  Name: MARKEVIS MENZEL MRN: [id number] Date of Birth: [birth year]  Date of referral:  11/26/15               Reason for consult:  Facility Placement                Permission sought to share information with:    Permission granted to share information::     Name::        Agency::     Relationship::     Contact Information:     Housing/Transportation Living arrangements for the past 2 months:  [place], [place] of Information:  Other (Comment Required) (adult stepdaughter) Patient Interpreter Needed:  None Criminal Activity/Legal Involvement Pertinent to Current Situation/Hospitalization:  No - Comment as needed Significant Relationships:  Other(Comment) (adult stepdaughter) Lives with:  Facility Resident Do you feel safe going back to the place where you live?    Need for family participation in patient care:     Care giving concerns:  Patient placed a couple of days ago at [place].    Social Worker assessment / plan:  Patient admitted from [place]. CSW spoke with Vaughan Basta at [facility] today and she stated that they could take patient back but would want him to have a barrier cream for his bottom and an anxiety medication prescribed for him at discharge because he tends to get agitated. CSW spoke with patient's responsible party: Army [id number] [phone] and [phone] today in patient's room. Ms. Benjamine Mola is aware that we will request a PT consult to assess in the event a higher level of care is recommended. CSW also discussed that with patient's dementia, the disease process will continue to progress and they may have some difficult decisions to make (i.e. Feeding issues, ability to participate in rehab, etc). Ms. Benjamine Mola verbalized understanding of this and stated that patient's intake has been getting worse.   Patient was a new admit to [facility] this week. For new admits to ALF's, it is  required to have a PPD that is placed and read. Patient had a PPD placed last admission but it was not read. Patient received a PPD today and it will need to be read Sunday or Monday and results sent to [facility] if patient returns to [facility].    Employment status:  Retired Forensic scientist:  Medicare PT Recommendations:  Not assessed at this time Information / Referral to community resources:     Patient/Family's Response to care:  Patient's stepdaughter expressed appreciation for CSW assistance.  Patient/Family's Understanding of and Emotional Response to Diagnosis, Current Treatment, and Prognosis:  Patient's stepdaughter acknowledges patient's decline in health and understands it is possible he may need a higher level of care and also verbalized understanding that goals of care may need to be discussed.  Emotional Assessment Appearance:  Appears stated age Attitude/Demeanor/Rapport:   (patient was sleeping) Affect (typically observed):   (patient was sleeping) Orientation:  Oriented to Self Alcohol / Substance use:  Not Applicable Psych involvement (Current and /or in the community):  No (Comment)  Discharge Needs  Concerns to be addressed:  Care Coordination Readmission within the last 30 days:  No Current discharge risk:  None Barriers to Discharge:  No Barriers Identified   Shela Leff, LCSW 11/26/2015, 5:18 PM

## 2015-11-26 NOTE — Progress Notes (Signed)
MD notified of pt frequently attempting to get OOB and c/o urinary urgency. Bladder scan results = 77ml, and urine output adequate. Orders for ativan IV x 1 given. MD also made aware of pt bradycardia during shift. Will give and continue to monitor hr via tele and behavior.

## 2015-11-26 NOTE — Progress Notes (Signed)
Pt's HR remains in the low 50's. He is alert with baseline confusion that his family says is typical for him and otherwise remains asymptomatic. Tele-strip from this morning indicates that patient is in 1st degree HB. MD is aware, will continue to monitor on telemetry, no new orders at this time.

## 2015-11-26 NOTE — Progress Notes (Signed)
[facility]                                                                                                                                                                                            Patient Demographics   [name], is a 82 y.o. male, DOB - 1923-03-04, [id number]  Admit date - 11/25/2015   Admitting Physician Nicholes Mango, MD  Outpatient Primary MD for the patient is [name], MD   LOS - 1  Subjective: Patient was recently admitted with diarrhea and poor by mouth intake. He was discharged to an assisted living now readmitted with acute renal failure with me in 2 days.  During his previous visit his stepchildren state that patient wanted everything done and be at full code.  He also may have genitourinary cancer which he has history of but did not want any evaluation by urology.  He also has a thigh mass.   Currently is very sleepy and unable to provide any review of systems   Review of Systems:   CONSTITUTIONAL: Unable to provide any review of system  Vitals:   Filed Vitals:   11/25/15 1442 11/25/15 1713 11/25/15 2033 11/26/15 0557  BP: 138/78 132/71 134/63 141/65  Pulse: 60 61 58 51  Temp:   97.3 F (36.3 C) 97.3 F (36.3 C)  TempSrc:   Oral Oral  Resp: 16 16 16 17   Height:      Weight:      SpO2: 97% 100% 100% 99%    Wt Readings from Last 3 Encounters:  11/25/15 76.658 kg (169 lb)  11/24/15 68.221 kg (150 lb 6.4 oz)  11/15/15 70.398 kg (155 lb 3.2 oz)     Intake/Output Summary (Last 24 hours) at 11/26/15 0953 Last data filed at 11/26/15 0540  Gross per 24 hour  Intake    902 ml  Output   1000 ml  Net    -98 ml    Physical Exam:   GENERAL: Pleasant-appearing in no apparent distress.  HEAD, EYES, EARS, NOSE AND THROAT: Atraumatic, normocephalic. . Pupils equal and reactive to light. Sclerae anicteric. No conjunctival injection. No oro-pharyngeal erythema.  NECK: Supple. There is no  jugular venous distention. No bruits, no lymphadenopathy, no thyromegaly.  HEART: Regular rate and rhythm,. No murmurs, no rubs, no clicks.  LUNGS: Clear to auscultation bilaterally. No rales or rhonchi. No wheezes.  ABDOMEN: Soft, flat, nontender, nondistended. Has good bowel sounds. No hepatosplenomegaly appreciated.  EXTREMITIES: No evidence of any cyanosis, clubbing, or peripheral edema.  +2 pedal and radial pulses bilaterally.  NEUROLOGIC: Currently drowsy  unable to do neuro exam SKIN: Moist and warm with no rashes appreciated.  Psych: Sleepy LN: No inguinal LN enlargement    Antibiotics   Anti-infectives    None      Medications   Scheduled Meds: . brinzolamide  1 drop Both Eyes BID  . cholestyramine  4 g Oral BID  . donepezil  10 mg Oral QHS  . dorzolamide-timolol  1 drop Left Eye BID  . dutasteride  0.5 mg Oral Daily  . feeding supplement (ENSURE ENLIVE)  237 mL Oral TID WC  . heparin  5,000 Units Subcutaneous 3 times per day  . latanoprost  1 drop Both Eyes QHS  . LORazepam  0.5 mg Intravenous Once  . sertraline  25 mg Oral Daily  . sodium chloride flush  3 mL Intravenous Q12H   Continuous Infusions: . dextrose 5 % and 0.9% NaCl 125 mL/hr at 11/25/15 1850   PRN Meds:.[DISCONTINUED] acetaminophen **OR** acetaminophen, acetaminophen, ondansetron **OR** ondansetron (ZOFRAN) IV, oxyCODONE   Data Review:   Micro Results Recent Results (from the past 240 hour(s))  Urine culture     Status: None (Preliminary result)   Collection Time: 11/25/15 12:09 PM  Result Value Ref Range Status   Specimen Description URINE, CLEAN CATCH  Final   Special Requests NONE  Final   Culture NO GROWTH < 24 HOURS  Final   Report Status PENDING  Incomplete    Radiology Reports Mr Femur Right W Wo Contrast  11/17/2015  CLINICAL DATA:  Mass along the medial right thigh seen on ultrasound. EXAM: MRI OF THE RIGHT FEMUR WITHOUT AND WITH CONTRAST TECHNIQUE: Multiplanar, multisequence MR  imaging of the lower right extremity was performed both before and after administration of intravenous contrast. CONTRAST:  58mL MULTIHANCE GADOBENATE DIMEGLUMINE 529 MG/ML IV SOLN COMPARISON:  11/16/2015 FINDINGS: 28.2 by 13.8 by 10.6 cm fatty mass primarily of the left posterior thigh is present extending from about the level of the knee joint cephalad. This mass completely surrounds the gracilis muscle, extends posterior and and lateral to the sartorius, envelops the combined semimembranosus and semitendinosus, anteriorly displaces and flattens the adductor magnus, and laterally displaces the biceps femoris. The sciatic nerve is displaced laterally along the margin of this mass and partially surrounded by the mass. Closer to the knee, the mass abuts the distal superficial femoral vessels and popliteal vessels. The mass has internal septations with high T2 signal and septal enhancement internally. I do not observe any definite internal enhancing nodules ; a internal low T1 signal nodule measuring 1.0 by 0.6 cm on image 19 series 3 along the anterior portion of the lesion probably represents a calcification, and several similar tiny nodules are present posteriorly along the upper margin of the lesion, but these do not appreciably enhance. No underlying bony abnormality observed. IMPRESSION: 1. Large fatty mass infiltrating the medial and posterior compartments of the thigh, measuring 28.2 cm in length, and surrounding and displacing multiple structures as noted above. This mass abuts the distal superficial femoral vasculature and sciatic nerve. The mass has the enhancing septal elements and some punctate low signal intensity nonenhancing elements which may be calcifications. Top differential diagnostic considerations include liposarcoma and lipoma. Soft tissue hemangioma is a possibility although the degree of fatty replacement is greater than is typically seen in hemangioma. Electronically Signed   By: Van Clines M.D.   On: 11/17/2015 12:46   US Venous Img Lower Unilateral Right  11/16/2015  CLINICAL DATA:  Right lower  extremity edema. History of bladder cancer. Evaluate for DVT. EXAM: RIGHT LOWER EXTREMITY VENOUS DOPPLER ULTRASOUND TECHNIQUE: Gray-scale sonography with graded compression, as well as color Doppler and duplex ultrasound were performed to evaluate the lower extremity deep venous systems from the level of the common femoral vein and including the common femoral, femoral, profunda femoral, popliteal and calf veins including the posterior tibial, peroneal and gastrocnemius veins when visible. The superficial great saphenous vein was also interrogated. Spectral Doppler was utilized to evaluate flow at rest and with distal augmentation maneuvers in the common femoral, femoral and popliteal veins. COMPARISON:  None. FINDINGS: Contralateral Common Femoral Vein: Respiratory phasicity is normal and symmetric with the symptomatic side. No evidence of thrombus. Normal compressibility. Common Femoral Vein: No evidence of thrombus. Normal compressibility, respiratory phasicity and response to augmentation. Saphenofemoral Junction: No evidence of thrombus. Normal compressibility and flow on color Doppler imaging. Profunda Femoral Vein: No evidence of thrombus. Normal compressibility and flow on color Doppler imaging. Femoral Vein: No evidence of thrombus. Normal compressibility, respiratory phasicity and response to augmentation. Popliteal Vein: No evidence of thrombus. Normal compressibility, respiratory phasicity and response to augmentation. Calf Veins: No evidence of thrombus. Normal compressibility and flow on color Doppler imaging. Superficial Great Saphenous Vein: No evidence of thrombus. Normal compressibility and flow on color Doppler imaging. Venous Reflux:  None. Other Findings: There is a slightly ill-defined approximately 7.9 x 2.2 x 5.5 cm lesion apparently within the musculature of the right  thigh. The caudal aspect of this structure demonstrates internal echogenicity similar to the surrounding fat and may represent a lipoma, however there are several complex components within its cranial aspect with dominant partially shadowing solid component measuring approximately 1.9 x 1.7 x 1.6 cm (images 42 and 44) and an adjacent complex cystic appearing component measuring approximately 1.4 x 1.1 x 1.8 cm (images 38 and 40). IMPRESSION: 1. No evidence of DVT within the right lower extremity. 2. Indeterminate complex approximately 7.9 cm partially cystic structure within the musculature of the right thigh. Differential considerations remain broad and include (but are not limited to) an atypical lipoma, soft tissue sarcoma, metastatic disease, abscess and evolving hematoma. Clinical correlation is advised. Further evaluation with MRI could be performed as clinically indicated. These results will be called to the ordering clinician or representative by the Radiologist Assistant, and communication documented in the PACS or zVision Dashboard. Electronically Signed   By: Sandi Mariscal M.D.   On: 11/16/2015 10:53     CBC  Recent Labs Lab 11/20/15 0542 11/21/15 0546 11/24/15 1653 11/25/15 0942 11/26/15 0455  WBC  --  10.2 12.5* 8.2 5.6  HGB 13.0 12.8* 13.6 12.9* 11.9*  HCT  --  37.4* 39.8 38.5* 35.6*  PLT  --  202 305 208 176  MCV  --  92.0 93.9 94.4 93.7  MCH  --  31.4 32.1 31.6 31.3  MCHC  --  34.2 34.2 33.4 33.4  RDW  --  16.1* 15.8* 16.3* 16.1*  LYMPHSABS  --   --   --  1.1  --   MONOABS  --   --   --  0.8  --   EOSABS  --   --   --  0.1  --   BASOSABS  --   --   --  0.0  --     Chemistries   Recent Labs Lab 11/24/15 1653 11/25/15 0942 11/26/15 0455  NA 138 141 143  K 6.8* 4.9 4.8  CL 102 110  115*  CO2 19* 21* 20*  GLUCOSE 95 115* 119*  BUN 84* 99* 79*  CREATININE 4.51* 4.65* 2.52*  CALCIUM 8.8 8.4* 8.3*  AST 31 39 30  ALT 37 40 34  ALKPHOS 71 67 57  BILITOT 1.4* 1.1 1.0    ------------------------------------------------------------------------------------------------------------------ estimated creatinine clearance is 20.8 mL/min (by C-G formula based on Cr of 2.52). ------------------------------------------------------------------------------------------------------------------ No results for input(s): HGBA1C in the last 72 hours. ------------------------------------------------------------------------------------------------------------------ No results for input(s): CHOL, HDL, LDLCALC, TRIG, CHOLHDL, LDLDIRECT in the last 72 hours. ------------------------------------------------------------------------------------------------------------------ No results for input(s): TSH, T4TOTAL, T3FREE, THYROIDAB in the last 72 hours.  Invalid input(s): FREET3 ------------------------------------------------------------------------------------------------------------------ No results for input(s): VITAMINB12, FOLATE, FERRITIN, TIBC, IRON, RETICCTPCT in the last 72 hours.  Coagulation profile No results for input(s): INR, PROTIME in the last 168 hours.  No results for input(s): DDIMER in the last 72 hours.  Cardiac Enzymes No results for input(s): CKMB, TROPONINI, MYOGLOBIN in the last 168 hours.  Invalid input(s): CK ------------------------------------------------------------------------------------------------------------------ Invalid input(s): [place]   1. Acute kidney injury-prerenal from severe dehydration Renal function improved with IV hydration continue fluids  2. History of bladder cancer and abnormal urinalysis Urine culture negative no antibiotics at this  3. Chronic history of diarrhea-extensive workup was done during the previous admission. C. difficile was negative. Patient's diarrhea has resolved he is on stool softeners which I will stop.  4.Right thigh mass of unclear etiology, concerning for lipoma versus  liposarcoma per MRI , patient was seen by oncologist and during the previous admission and risks and benefits of further investigations were explained to them. Family decided not to pursue further, outpatient follow-up with Dr. Burlene Arnt as needed  5. Dementia with possible depression patient was seen by Dr. Weber Cooks during previous admission and started on Aricept. Will continue the same.   6. Failure to thrive adult with poor by mouth intake-encourage by mouth fluid intake Add Megace  7. CODE STATUS full: Palliative care consult patient wanted everything done 1 week prior. At his age and failure to thrive he should be a DO NOT RESUSCITATE. I have discussed this with his stepchildren there is no healthcare power of attorney at this point.     Code Status Orders        Start     Ordered   11/25/15 1808  Full code   Continuous     11/25/15 1808    Code Status History    Date Active Date Inactive Code Status Order ID Comments User Context   11/15/2015  9:02 PM 11/22/2015  8:44 PM Full Code [id number]  Fritzi Mandes, MD Inpatient           Consults  none  DVT Prophylaxis  heparin  Lab Results  Component Value Date   PLT 176 11/26/2015     Time Spent in minutes   Every 35 minutes Dustin Flock M.D on 11/26/2015 at 9:53 AM  Between 7am to 6pm - Pager - [phone]  After 6pm go to [email or web address] - password [email or web address] [address] [facility]   Office  [phone]

## 2015-11-26 NOTE — Evaluation (Signed)
Clinical/Bedside Swallow Evaluation Patient Details  Name: [name] MRN: [id number] Date of Birth: [birth year]  Today's Date: 11/26/2015 Time: SLP Start Time (ACUTE ONLY): 8 SLP Stop Time (ACUTE ONLY): 1310 SLP Time Calculation (min) (ACUTE ONLY): 50 min  Past Medical History:  Past Medical History  Diagnosis Date  . Arrhythmia     pvcs  . Hyperlipidemia   . Cancer ([place])     bladder  . BPH (benign prostatic hyperplasia)   . Hypertension   . PONV (postoperative nausea and vomiting)   . Glaucoma   . Fracture of right talus     diagonosed 12/22/13- last f/u note on chart dated 01/02/14.    Past Surgical History:  Past Surgical History  Procedure Laterality Date  . Bladder surgery  4 or 5 years ago    "cut bladder cancer out"  . Tonsillectomy and adenoidectomy  as child  . Cystoscopy/retrograde/ureteroscopy Bilateral 11/17/2013    Procedure: CYSTOSCOPY, bilateral /RETROGRADE/ right URETEROSCOPY, right stent, bladder fulgeration;  Surgeon: Bernestine Amass, MD;  Location: [facility];  Service: Urology;  Laterality: Bilateral;  . Cystoscopy with retrograde pyelogram, ureteroscopy and stent placement Bilateral 01/12/2014    Procedure: CYSTOSCOPY WITH BILATERAL RETROGRADE PYELOGRAM/URETERAL STENT REMOVAL, URETEROSCOPY, LEFT  AND  BLADDER BIOPSY;  Surgeon: Bernestine Amass, MD;  Location: [facility];  Service: Urology;  Laterality: Bilateral;   HPI:  [name] is a 82 y.o. male with a known history of dementia, bladder cancer, hyperlipidemia, glaucoma and hypertension and other medical problems was just recently discharged from the hospital after treating for diarrhea, severe protein energy malnutrition to an assisted living facility. Family members think that patient is depressed and not drinking enough, seen by primary care physician yesterday and lab work was done. PCP send the patient to the hospital for abnormal labs today. Patient's creatinine is elevated to 4.65 and BUN is at 99. Hospitalist  team is called to admit the patient and patient has received 1500 cc IV fluid bolus in the ED. Patient's stepson was at bedside.   Assessment / Plan / Recommendation Clinical Impression  Pt presents w/no apparent s/s of oropharyngeal dysphagia. No overt s/s of aspiration were observed w/thin, puree or solids. Laryngeal elevation appeared adequate and vocal quality remained clear. Oral phase appeared [facility]. No pocketing or holding was observed. Pt took only two very small bites of solids, but mastication appeared adequate. Pt appeared aversive to anything but ice cream and water. Pt denies any pain or discomfort when eating/drinking. Pt only stated, "I don't know," when asked why he did not want to eat.  Nsg also reports pt is refusing all medications. Recommend continue w/ Dysphagia III diet w/thin liquids. Recommend dietitian consult. Will f/u in 1-2 days.     Aspiration Risk  Mild aspiration risk    Diet Recommendation Dysphagia 3 (Mech soft);Thin liquid   Liquid Administration via: Cup Medication Administration: Whole meds with puree Supervision: Staff to assist with self feeding Compensations: Minimize environmental distractions;Slow rate;Small sips/bites Postural Changes: Seated upright at 90 degrees    Other  Recommendations Oral Care Recommendations: Oral care BID;Staff/trained caregiver to provide oral care   Follow up Recommendations  Skilled Nursing facility    Frequency and Duration min 2x/week  1 week       Prognosis Prognosis for Safe Diet Advancement: Fair Barriers to Reach Goals: Cognitive deficits;Motivation      Swallow Study   General Date of Onset: 11/26/15 HPI: [name] is a 82 y.o. male  with a known history of dementia, bladder cancer, hyperlipidemia, glaucoma and hypertension and other medical problems was just recently discharged from the hospital after treating for diarrhea, severe protein energy malnutrition to an assisted living facility. Family members  think that patient is depressed and not drinking enough, seen by primary care physician yesterday and lab work was done. PCP send the patient to the hospital for abnormal labs today. Patient's creatinine is elevated to 4.65 and BUN is at 99. Hospitalist team is called to admit the patient and patient has received 1500 cc IV fluid bolus in the ED. Patient's stepson was at bedside. Type of Study: Bedside Swallow Evaluation Previous Swallow Assessment: none  Diet Prior to this Study: Dysphagia 3 (soft);Thin liquids Temperature Spikes Noted: No Respiratory Status: Room air History of Recent Intubation: No Behavior/Cognition: Alert;Confused Oral Cavity Assessment: Within Functional Limits Oral Care Completed by SLP: No Oral Cavity - Dentition: Adequate natural dentition Vision: Functional for self-feeding Self-Feeding Abilities: Needs assist;Needs set up;Other (Comment) (Per stepson pt is able to feed himself but refused to with ST. Only fed himself water) Patient Positioning: Upright in bed Baseline Vocal Quality: Normal Volitional Swallow: Able to elicit    Oral/Motor/Sensory Function Overall Oral Motor/Sensory Function: Within functional limits   Ice Chips Ice chips: Not tested   Thin Liquid Thin Liquid: Within functional limits Presentation: Cup;Self Fed    Nectar Thick Nectar Thick Liquid: Not tested   Honey Thick Honey Thick Liquid: Not tested   Puree Puree: Within functional limits Presentation: Spoon   Solid   GO   Solid: Within functional limits Other Comments: Very small bites taken from cracker and sandwich        Inman,Saharsh Sterling 11/26/2015,1:16 PM

## 2015-11-27 LAB — URINE CULTURE: Culture: NO GROWTH

## 2015-11-27 LAB — BASIC METABOLIC PANEL
ANION GAP: 6 (ref 5–15)
BUN: 47 mg/dL — AB (ref 6–20)
CALCIUM: 8.1 mg/dL — AB (ref 8.9–10.3)
CO2: 21 mmol/L — ABNORMAL LOW (ref 22–32)
CREATININE: 1.53 mg/dL — AB (ref 0.61–1.24)
Chloride: 117 mmol/L — ABNORMAL HIGH (ref 101–111)
GFR calc Af Amer: 44 mL/min — ABNORMAL LOW (ref >60)
GFR calc non Af Amer: 38 mL/min — ABNORMAL LOW (ref >60)
Glucose, Bld: 106 mg/dL — ABNORMAL HIGH (ref 65–99)
Potassium: 4.3 mmol/L (ref 3.5–5.1)
Sodium: 144 mmol/L (ref 135–145)

## 2015-11-27 LAB — CBC
HCT: 35.8 % — ABNORMAL LOW (ref 40.0–52.0)
Hemoglobin: 12.2 g/dL — ABNORMAL LOW (ref 13.0–18.0)
MCH: 31.7 pg (ref 26.0–34.0)
MCHC: 33.9 g/dL (ref 32.0–36.0)
MCV: 93.5 fL (ref 80.0–100.0)
PLATELETS: 162 10*3/uL (ref 150–440)
RBC: 3.83 MIL/uL — AB (ref 4.40–5.90)
RDW: 15.9 % — AB (ref 11.5–14.5)
WBC: 3.8 10*3/uL (ref 3.8–10.6)

## 2015-11-27 MED ORDER — LORAZEPAM 2 MG/ML IJ SOLN
0.5000 mg | Freq: Four times a day (QID) | INTRAMUSCULAR | Status: DC | PRN
  Administered 2015-11-27 – 2015-11-30 (×7): 0.5 mg via INTRAVENOUS
  Filled 2015-11-27 (×8): qty 1

## 2015-11-27 MED ORDER — SODIUM CHLORIDE 0.9 % IV SOLN
INTRAVENOUS | Status: DC
  Administered 2015-11-27 (×2): via INTRAVENOUS

## 2015-11-27 NOTE — Progress Notes (Signed)
[place] [facility]                                                                                                                                                                                            Patient Demographics   Trieu Korey, is a 82 y.o. male, DOB - May 26, 1923, [id number]  Admit date - 11/25/2015   Admitting Physician Nicholes Mango, MD  Outpatient Primary MD for the patient is [name], MD   LOS - 2  Subjective: continues to have poor po intake.  Review of Systems:   CONSTITUTIONAL: Unable to provide any review of system  Vitals:   Filed Vitals:   11/26/15 0557 11/26/15 1838 11/26/15 1949 11/27/15 0606  BP: 141/65 173/78 177/74 166/79  Pulse: 51 58 49 55  Temp: 97.3 F (36.3 C) 97.7 F (36.5 C) 97.8 F (36.6 C) 97.6 F (36.4 C)  TempSrc: Oral Oral Oral Oral  Resp: 17 16 16 20   Height:      Weight:      SpO2: 99% 97% 98% 98%    Wt Readings from Last 3 Encounters:  11/25/15 76.658 kg (169 lb)  11/24/15 68.221 kg (150 lb 6.4 oz)  11/15/15 70.398 kg (155 lb 3.2 oz)     Intake/Output Summary (Last 24 hours) at 11/27/15 1122 Last data filed at 11/27/15 1038  Gross per 24 hour  Intake 2708.96 ml  Output    525 ml  Net 2183.96 ml    Physical Exam:   GENERAL: Pleasant-appearing in no apparent distress.  HEAD, EYES, EARS, NOSE AND THROAT: Atraumatic, normocephalic. . Pupils equal and reactive to light. Sclerae anicteric. No conjunctival injection. No oro-pharyngeal erythema.  NECK: Supple. There is no jugular venous distention. No bruits, no lymphadenopathy, no thyromegaly.  HEART: Regular rate and rhythm,. No murmurs, no rubs, no clicks.  LUNGS: Clear to auscultation bilaterally. No rales or rhonchi. No wheezes.  ABDOMEN: Soft, flat, nontender, nondistended. Has good bowel sounds. No hepatosplenomegaly appreciated.  EXTREMITIES: No evidence of any cyanosis, clubbing, or peripheral edema.  +2 pedal and  radial pulses bilaterally.  NEUROLOGIC: Unable to do full neurological exam secondary to dementia. SKIN: Moist and warm with no rashes appreciated.  Psych: Sleepy LN: No inguinal LN enlargement    Antibiotics   Anti-infectives    None      Medications   Scheduled Meds: . brinzolamide  1 drop Both Eyes BID  . cholestyramine  4 g Oral BID  . donepezil  10 mg Oral QHS  . dorzolamide-timolol  1 drop Left Eye BID  . dutasteride  0.5  mg Oral Daily  . feeding supplement (ENSURE ENLIVE)  237 mL Oral TID WC  . heparin  5,000 Units Subcutaneous 3 times per day  . latanoprost  1 drop Both Eyes QHS  . megestrol  400 mg Oral BID  . sertraline  25 mg Oral Daily  . sodium chloride flush  3 mL Intravenous Q12H  . tuberculin  5 Units Intradermal Once   Continuous Infusions: . dextrose 5 % and 0.9% NaCl 125 mL/hr at 11/27/15 1038   PRN Meds:.[DISCONTINUED] acetaminophen **OR** acetaminophen, acetaminophen, LORazepam, ondansetron **OR** ondansetron (ZOFRAN) IV, oxyCODONE   Data Review:   Micro Results Recent Results (from the past 240 hour(s))  Urine culture     Status: None   Collection Time: 11/25/15 12:09 PM  Result Value Ref Range Status   Specimen Description URINE, CLEAN CATCH  Final   Special Requests NONE  Final   Culture NO GROWTH 2 DAYS  Final   Report Status 11/27/2015 FINAL  Final    Radiology Reports Mr Femur Right W Wo Contrast  11/17/2015  CLINICAL DATA:  Mass along the medial right thigh seen on ultrasound. EXAM: MRI OF THE RIGHT FEMUR WITHOUT AND WITH CONTRAST TECHNIQUE: Multiplanar, multisequence MR imaging of the lower right extremity was performed both before and after administration of intravenous contrast. CONTRAST:  60mL MULTIHANCE GADOBENATE DIMEGLUMINE 529 MG/ML IV SOLN COMPARISON:  11/16/2015 FINDINGS: 28.2 by 13.8 by 10.6 cm fatty mass primarily of the left posterior thigh is present extending from about the level of the knee joint cephalad. This mass  completely surrounds the gracilis muscle, extends posterior and and lateral to the sartorius, envelops the combined semimembranosus and semitendinosus, anteriorly displaces and flattens the adductor magnus, and laterally displaces the biceps femoris. The sciatic nerve is displaced laterally along the margin of this mass and partially surrounded by the mass. Closer to the knee, the mass abuts the distal superficial femoral vessels and popliteal vessels. The mass has internal septations with high T2 signal and septal enhancement internally. I do not observe any definite internal enhancing nodules ; a internal low T1 signal nodule measuring 1.0 by 0.6 cm on image 19 series 3 along the anterior portion of the lesion probably represents a calcification, and several similar tiny nodules are present posteriorly along the upper margin of the lesion, but these do not appreciably enhance. No underlying bony abnormality observed. IMPRESSION: 1. Large fatty mass infiltrating the medial and posterior compartments of the thigh, measuring 28.2 cm in length, and surrounding and displacing multiple structures as noted above. This mass abuts the distal superficial femoral vasculature and sciatic nerve. The mass has the enhancing septal elements and some punctate low signal intensity nonenhancing elements which may be calcifications. Top differential diagnostic considerations include liposarcoma and lipoma. Soft tissue hemangioma is a possibility although the degree of fatty replacement is greater than is typically seen in hemangioma. Electronically Signed   By: Van Clines M.D.   On: 11/17/2015 12:46   US Venous Img Lower Unilateral Right  11/16/2015  CLINICAL DATA:  Right lower extremity edema. History of bladder cancer. Evaluate for DVT. EXAM: RIGHT LOWER EXTREMITY VENOUS DOPPLER ULTRASOUND TECHNIQUE: Gray-scale sonography with graded compression, as well as color Doppler and duplex ultrasound were performed to evaluate  the lower extremity deep venous systems from the level of the common femoral vein and including the common femoral, femoral, profunda femoral, popliteal and calf veins including the posterior tibial, peroneal and gastrocnemius veins when visible. The superficial great  saphenous vein was also interrogated. Spectral Doppler was utilized to evaluate flow at rest and with distal augmentation maneuvers in the common femoral, femoral and popliteal veins. COMPARISON:  None. FINDINGS: Contralateral Common Femoral Vein: Respiratory phasicity is normal and symmetric with the symptomatic side. No evidence of thrombus. Normal compressibility. Common Femoral Vein: No evidence of thrombus. Normal compressibility, respiratory phasicity and response to augmentation. Saphenofemoral Junction: No evidence of thrombus. Normal compressibility and flow on color Doppler imaging. Profunda Femoral Vein: No evidence of thrombus. Normal compressibility and flow on color Doppler imaging. Femoral Vein: No evidence of thrombus. Normal compressibility, respiratory phasicity and response to augmentation. Popliteal Vein: No evidence of thrombus. Normal compressibility, respiratory phasicity and response to augmentation. Calf Veins: No evidence of thrombus. Normal compressibility and flow on color Doppler imaging. Superficial Great Saphenous Vein: No evidence of thrombus. Normal compressibility and flow on color Doppler imaging. Venous Reflux:  None. Other Findings: There is a slightly ill-defined approximately 7.9 x 2.2 x 5.5 cm lesion apparently within the musculature of the right thigh. The caudal aspect of this structure demonstrates internal echogenicity similar to the surrounding fat and may represent a lipoma, however there are several complex components within its cranial aspect with dominant partially shadowing solid component measuring approximately 1.9 x 1.7 x 1.6 cm (images 42 and 44) and an adjacent complex cystic appearing component  measuring approximately 1.4 x 1.1 x 1.8 cm (images 38 and 40). IMPRESSION: 1. No evidence of DVT within the right lower extremity. 2. Indeterminate complex approximately 7.9 cm partially cystic structure within the musculature of the right thigh. Differential considerations remain broad and include (but are not limited to) an atypical lipoma, soft tissue sarcoma, metastatic disease, abscess and evolving hematoma. Clinical correlation is advised. Further evaluation with MRI could be performed as clinically indicated. These results will be called to the ordering clinician or representative by the Radiologist Assistant, and communication documented in the PACS or zVision Dashboard. Electronically Signed   By: Sandi Mariscal M.D.   On: 11/16/2015 10:53     CBC  Recent Labs Lab 11/21/15 0546 11/24/15 1653 11/25/15 0942 11/26/15 0455 11/27/15 0536  WBC 10.2 12.5* 8.2 5.6 3.8  HGB 12.8* 13.6 12.9* 11.9* 12.2*  HCT 37.4* 39.8 38.5* 35.6* 35.8*  PLT 202 305 208 176 162  MCV 92.0 93.9 94.4 93.7 93.5  MCH 31.4 32.1 31.6 31.3 31.7  MCHC 34.2 34.2 33.4 33.4 33.9  RDW 16.1* 15.8* 16.3* 16.1* 15.9*  LYMPHSABS  --   --  1.1  --   --   MONOABS  --   --  0.8  --   --   EOSABS  --   --  0.1  --   --   BASOSABS  --   --  0.0  --   --     Chemistries   Recent Labs Lab 11/24/15 1653 11/25/15 0942 11/26/15 0455 11/27/15 0536  NA 138 141 143 144  K 6.8* 4.9 4.8 4.3  CL 102 110 115* 117*  CO2 19* 21* 20* 21*  GLUCOSE 95 115* 119* 106*  BUN 84* 99* 79* 47*  CREATININE 4.51* 4.65* 2.52* 1.53*  CALCIUM 8.8 8.4* 8.3* 8.1*  AST 31 39 30  --   ALT 37 40 34  --   ALKPHOS 71 67 57  --   BILITOT 1.4* 1.1 1.0  --    ------------------------------------------------------------------------------------------------------------------ estimated creatinine clearance is 34.2 mL/min (by C-G formula based on Cr of  1.53). ------------------------------------------------------------------------------------------------------------------  No results for input(s): HGBA1C in the last 72 hours. ------------------------------------------------------------------------------------------------------------------ No results for input(s): CHOL, HDL, LDLCALC, TRIG, CHOLHDL, LDLDIRECT in the last 72 hours. ------------------------------------------------------------------------------------------------------------------ No results for input(s): TSH, T4TOTAL, T3FREE, THYROIDAB in the last 72 hours.  Invalid input(s): FREET3 ------------------------------------------------------------------------------------------------------------------ No results for input(s): VITAMINB12, FOLATE, FERRITIN, TIBC, IRON, RETICCTPCT in the last 72 hours.  Coagulation profile No results for input(s): INR, PROTIME in the last 168 hours.  No results for input(s): DDIMER in the last 72 hours.  Cardiac Enzymes No results for input(s): CKMB, TROPONINI, MYOGLOBIN in the last 168 hours.  Invalid input(s): CK ------------------------------------------------------------------------------------------------------------------ Invalid input(s): [place]   1. Acute kidney injury-prerenal from severe dehydration Renal function improved with IV hydration continue fluids   2. History of bladder cancer and abnormal urinalysis Urine culture negative no antibiotics at this Had history of bladder cancer but did not want an evaluation by urology.  3. Chronic history of diarrhea-extensive workup was done during the previous admission. C. difficile was negative. Patient's diarrhea has resolved   4.Right thigh mass of unclear etiology, concerning for lipoma versus liposarcoma per MRI , patient was seen by oncologist and during the previous admission and risks and benefits of further investigations were explained to them. Family  decided not to pursue further, outpatient follow-up with Dr. Burlene Arnt as needed  5. Dementia with possible depression patient was seen by Dr. Weber Cooks during previous admission and started on Aricept. Will continue the same.   6. Failure to thrive adult with poor by mouth intake-encourage by mouth fluid intake continues to have poor by mouth intake,  . Added  Megace  7. CODE STATUS full: Palliative care consult patient wanted everything done 1 week prior. At his age and failure to thrive he should be a DO NOT RESUSCITATE. I have discussed this with his stepchildren there is no healthcare power of attorney at this point. 8.sinus bradycardia: Not on beta blockers. Watch closely.    Code Status Orders        Start     Ordered   11/25/15 1808  Full code   Continuous     11/25/15 1808    Code Status History    Date Active Date Inactive Code Status Order ID Comments User Context   11/15/2015  9:02 PM 11/22/2015  8:44 PM Full Code [id number]  Fritzi Mandes, MD Inpatient           Consults  none  DVT Prophylaxis  heparin  Lab Results  Component Value Date   PLT 162 11/27/2015     Time Spent in minutes   3 min Dakotah Orrego M.D on 11/27/2015 at 11:22 AM  Between 7am to 6pm - Pager - [phone]  After 6pm go to [email or web address] - password [email or web address] [facility]   Office  [phone]

## 2015-11-27 NOTE — Progress Notes (Signed)
Patient's pulse rate was 52 palpated and per telemetry running at 47.  Dr. Vianne Bulls notified.  No changes in orders. Continue to keep patient on off unit telelmetry.

## 2015-11-27 NOTE — Progress Notes (Signed)
Initial Nutrition Assessment    INTERVENTION:  Meals and snacks: Monitor intake and tolerance Medical Nutrition Supplement Therapy: Agree with Ensure Enlive po TID, each supplement provides 350 kcal and 20 grams of protein for additional nutrition    NUTRITION DIAGNOSIS:   Inadequate oral intake related to poor appetite, lethargy/confusion as evidenced by meal completion < 25%.    GOAL:   Patient will meet greater than or equal to 90% of their needs    MONITOR:    (Energy intake)  REASON FOR ASSESSMENT:   Consult Poor PO  ASSESSMENT:      Pt admitted with acute kidney injury, dehydration  Past Medical History  Diagnosis Date  . Arrhythmia     pvcs  . Hyperlipidemia   . Cancer ([place])     bladder  . BPH (benign prostatic hyperplasia)   . Hypertension   . PONV (postoperative nausea and vomiting)   . Glaucoma   . Fracture of right talus     diagonosed 12/22/13- last f/u note on chart dated 01/02/14.     Current Nutrition: nothing eaten this am, full tray at bedside. Pt sleeping and tried to arouse but unable to.  Spoke with RN, Marliss Czar and reports received ativan  Food/Nutrition-Related History: unsure at this time   Scheduled Medications:  . brinzolamide  1 drop Both Eyes BID  . cholestyramine  4 g Oral BID  . donepezil  10 mg Oral QHS  . dorzolamide-timolol  1 drop Left Eye BID  . dutasteride  0.5 mg Oral Daily  . feeding supplement (ENSURE ENLIVE)  237 mL Oral TID WC  . heparin  5,000 Units Subcutaneous 3 times per day  . latanoprost  1 drop Both Eyes QHS  . megestrol  400 mg Oral BID  . sertraline  25 mg Oral Daily  . sodium chloride flush  3 mL Intravenous Q12H  . tuberculin  5 Units Intradermal Once    Continuous Medications:  . dextrose 5 % and 0.9% NaCl 125 mL/hr at 11/27/15 0240     Electrolyte/Renal Profile and Glucose Profile:   Recent Labs Lab 11/25/15 0942 11/26/15 0455 11/27/15 0536  NA 141 143 144  K 4.9 4.8 4.3  CL 110 115*  117*  CO2 21* 20* 21*  BUN 99* 79* 47*  CREATININE 4.65* 2.52* 1.53*  CALCIUM 8.4* 8.3* 8.1*  GLUCOSE 115* 119* 106*   Protein Profile:  Recent Labs Lab 11/24/15 1653 11/25/15 0942 11/26/15 0455  ALBUMIN 4.4 4.0 3.5    Gastrointestinal Profile: Last BM:2/3   Nutrition-Focused Physical Exam Findings:  Unable to complete Nutrition-Focused physical exam at this time.     Weight Change: Noted 17% wt loss in the last year 8% wt loss in the last month (?? Question accuracy of wt taken on  11/25/2015   Diet Order:  DIET DYS 3 Room service appropriate?: Yes; Fluid consistency:: Thin  Skin:   reviewed   Height:   Ht Readings from Last 1 Encounters:  11/25/15 5\' 11"  (1.803 m)    Weight:   Wt Readings from Last 1 Encounters:  11/25/15 169 lb (76.658 kg)    Ideal Body Weight:     BMI:  Body mass index is 23.58 kg/(m^2).  Estimated Nutritional Needs:   Kcal:  BEE 1301 kcals (IF 1.1-1.3, AF 1.2) [id number] kcals/d.   Protein:  70-84 g/kg (1.0-1.2 g/kg)   Fluid:  1760-2124ml /d (25-52ml/kg)   EDUCATION NEEDS:   No education needs identified at this time  HIGH Care Level  Nathalee Smarr B. Zenia Resides, [place], [place] (pager) Weekend/On-Call pager [phone])

## 2015-11-28 MED ORDER — DEXTROSE-NACL 5-0.45 % IV SOLN
INTRAVENOUS | Status: DC
  Administered 2015-11-28 – 2015-12-01 (×5): via INTRAVENOUS
  Filled 2015-11-28: qty 1000

## 2015-11-28 NOTE — Progress Notes (Signed)
[place] at [facility]                                                                                                                                                                                            Patient Demographics   [name], is a 82 y.o. male, DOB - 1923-04-10, LE:[id number]  Admit date - 11/25/2015   Admitting Physician Nicholes Mango, MD  Outpatient Primary MD for the patient is [name], MD   LOS - 3  Subjective: continues to have poor po intake. Confused has baseline dementia. Has sitter at bedside because  he was trying to get out of the bed yesterday.  Review of Systems:   CONSTITUTIONAL: Unable to provide any review of system  Vitals:   Filed Vitals:   11/27/15 1243 11/27/15 1253 11/27/15 2036 11/28/15 0604  BP: 160/80  183/78 180/87  Pulse: 51  61 53  Temp:  97.9 F (36.6 C) 97.7 F (36.5 C) 97.4 F (36.3 C)  TempSrc:  Oral Oral Oral  Resp: 16  16 14   Height:      Weight:      SpO2: 97%  98% 100%    Wt Readings from Last 3 Encounters:  11/25/15 76.658 kg (169 lb)  11/24/15 68.221 kg (150 lb 6.4 oz)  11/15/15 70.398 kg (155 lb 3.2 oz)     Intake/Output Summary (Last 24 hours) at 11/28/15 1113 Last data filed at 11/28/15 0927  Gross per 24 hour  Intake    888 ml  Output   1450 ml  Net   -562 ml    Physical Exam:   GENERAL: Pleasant-appearing in no apparent distress.  HEAD, EYES, EARS, NOSE AND THROAT: Atraumatic, normocephalic. . Pupils equal and reactive to light. Sclerae anicteric. No conjunctival injection. No oro-pharyngeal erythema.  NECK: Supple. There is no jugular venous distention. No bruits, no lymphadenopathy, no thyromegaly.  HEART: Regular rate and rhythm,. No murmurs, no rubs, no clicks.  LUNGS: Clear to auscultation bilaterally. No rales or rhonchi. No wheezes.  ABDOMEN: Soft, flat, nontender, nondistended. Has good bowel sounds. No hepatosplenomegaly appreciated.  EXTREMITIES:  No evidence of any cyanosis, clubbing, or peripheral edema.  +2 pedal and radial pulses bilaterally.  NEUROLOGIC: Unable to do full neurological exam secondary to dementia. SKIN: Moist and warm with no rashes appreciated.  Psych: Sleepy LN: No inguinal LN enlargement    Antibiotics   Anti-infectives    None      Medications   Scheduled Meds: . brinzolamide  1 drop Both Eyes BID  . cholestyramine  4 g Oral BID  .  donepezil  10 mg Oral QHS  . dorzolamide-timolol  1 drop Left Eye BID  . dutasteride  0.5 mg Oral Daily  . feeding supplement (ENSURE ENLIVE)  237 mL Oral TID WC  . heparin  5,000 Units Subcutaneous 3 times per day  . latanoprost  1 drop Both Eyes QHS  . megestrol  400 mg Oral BID  . sertraline  25 mg Oral Daily  . sodium chloride flush  3 mL Intravenous Q12H  . tuberculin  5 Units Intradermal Once   Continuous Infusions: . sodium chloride 75 mL/hr at 11/27/15 2313   PRN Meds:.[DISCONTINUED] acetaminophen **OR** acetaminophen, acetaminophen, LORazepam, ondansetron **OR** ondansetron (ZOFRAN) IV, oxyCODONE   Data Review:   Micro Results Recent Results (from the past 240 hour(s))  Urine culture     Status: None   Collection Time: 11/25/15 12:09 PM  Result Value Ref Range Status   Specimen Description URINE, CLEAN CATCH  Final   Special Requests NONE  Final   Culture NO GROWTH 2 DAYS  Final   Report Status 11/27/2015 FINAL  Final    Radiology Reports Mr Femur Right W Wo Contrast  11/17/2015  CLINICAL DATA:  Mass along the medial right thigh seen on ultrasound. EXAM: MRI OF THE RIGHT FEMUR WITHOUT AND WITH CONTRAST TECHNIQUE: Multiplanar, multisequence MR imaging of the lower right extremity was performed both before and after administration of intravenous contrast. CONTRAST:  15mL MULTIHANCE GADOBENATE DIMEGLUMINE 529 MG/ML IV SOLN COMPARISON:  11/16/2015 FINDINGS: 28.2 by 13.8 by 10.6 cm fatty mass primarily of the left posterior thigh is present extending  from about the level of the knee joint cephalad. This mass completely surrounds the gracilis muscle, extends posterior and and lateral to the sartorius, envelops the combined semimembranosus and semitendinosus, anteriorly displaces and flattens the adductor magnus, and laterally displaces the biceps femoris. The sciatic nerve is displaced laterally along the margin of this mass and partially surrounded by the mass. Closer to the knee, the mass abuts the distal superficial femoral vessels and popliteal vessels. The mass has internal septations with high T2 signal and septal enhancement internally. I do not observe any definite internal enhancing nodules ; a internal low T1 signal nodule measuring 1.0 by 0.6 cm on image 19 series 3 along the anterior portion of the lesion probably represents a calcification, and several similar tiny nodules are present posteriorly along the upper margin of the lesion, but these do not appreciably enhance. No underlying bony abnormality observed. IMPRESSION: 1. Large fatty mass infiltrating the medial and posterior compartments of the thigh, measuring 28.2 cm in length, and surrounding and displacing multiple structures as noted above. This mass abuts the distal superficial femoral vasculature and sciatic nerve. The mass has the enhancing septal elements and some punctate low signal intensity nonenhancing elements which may be calcifications. Top differential diagnostic considerations include liposarcoma and lipoma. Soft tissue hemangioma is a possibility although the degree of fatty replacement is greater than is typically seen in hemangioma. Electronically Signed   By: Van Clines M.D.   On: 11/17/2015 12:46   US Venous Img Lower Unilateral Right  11/16/2015  CLINICAL DATA:  Right lower extremity edema. History of bladder cancer. Evaluate for DVT. EXAM: RIGHT LOWER EXTREMITY VENOUS DOPPLER ULTRASOUND TECHNIQUE: Gray-scale sonography with graded compression, as well as color  Doppler and duplex ultrasound were performed to evaluate the lower extremity deep venous systems from the level of the common femoral vein and including the common femoral, femoral, profunda femoral, popliteal  and calf veins including the posterior tibial, peroneal and gastrocnemius veins when visible. The superficial great saphenous vein was also interrogated. Spectral Doppler was utilized to evaluate flow at rest and with distal augmentation maneuvers in the common femoral, femoral and popliteal veins. COMPARISON:  None. FINDINGS: Contralateral Common Femoral Vein: Respiratory phasicity is normal and symmetric with the symptomatic side. No evidence of thrombus. Normal compressibility. Common Femoral Vein: No evidence of thrombus. Normal compressibility, respiratory phasicity and response to augmentation. Saphenofemoral Junction: No evidence of thrombus. Normal compressibility and flow on color Doppler imaging. Profunda Femoral Vein: No evidence of thrombus. Normal compressibility and flow on color Doppler imaging. Femoral Vein: No evidence of thrombus. Normal compressibility, respiratory phasicity and response to augmentation. Popliteal Vein: No evidence of thrombus. Normal compressibility, respiratory phasicity and response to augmentation. Calf Veins: No evidence of thrombus. Normal compressibility and flow on color Doppler imaging. Superficial Great Saphenous Vein: No evidence of thrombus. Normal compressibility and flow on color Doppler imaging. Venous Reflux:  None. Other Findings: There is a slightly ill-defined approximately 7.9 x 2.2 x 5.5 cm lesion apparently within the musculature of the right thigh. The caudal aspect of this structure demonstrates internal echogenicity similar to the surrounding fat and may represent a lipoma, however there are several complex components within its cranial aspect with dominant partially shadowing solid component measuring approximately 1.9 x 1.7 x 1.6 cm (images 42 and  44) and an adjacent complex cystic appearing component measuring approximately 1.4 x 1.1 x 1.8 cm (images 38 and 40). IMPRESSION: 1. No evidence of DVT within the right lower extremity. 2. Indeterminate complex approximately 7.9 cm partially cystic structure within the musculature of the right thigh. Differential considerations remain broad and include (but are not limited to) an atypical lipoma, soft tissue sarcoma, metastatic disease, abscess and evolving hematoma. Clinical correlation is advised. Further evaluation with MRI could be performed as clinically indicated. These results will be called to the ordering clinician or representative by the Radiologist Assistant, and communication documented in the PACS or zVision Dashboard. Electronically Signed   By: Sandi Mariscal M.D.   On: 11/16/2015 10:53     CBC  Recent Labs Lab 11/24/15 1653 11/25/15 0942 11/26/15 0455 11/27/15 0536  WBC 12.5* 8.2 5.6 3.8  HGB 13.6 12.9* 11.9* 12.2*  HCT 39.8 38.5* 35.6* 35.8*  PLT 305 208 176 162  MCV 93.9 94.4 93.7 93.5  MCH 32.1 31.6 31.3 31.7  MCHC 34.2 33.4 33.4 33.9  RDW 15.8* 16.3* 16.1* 15.9*  LYMPHSABS  --  1.1  --   --   MONOABS  --  0.8  --   --   EOSABS  --  0.1  --   --   BASOSABS  --  0.0  --   --     Chemistries   Recent Labs Lab 11/24/15 1653 11/25/15 0942 11/26/15 0455 11/27/15 0536  NA 138 141 143 144  K 6.8* 4.9 4.8 4.3  CL 102 110 115* 117*  CO2 19* 21* 20* 21*  GLUCOSE 95 115* 119* 106*  BUN 84* 99* 79* 47*  CREATININE 4.51* 4.65* 2.52* 1.53*  CALCIUM 8.8 8.4* 8.3* 8.1*  AST 31 39 30  --   ALT 37 40 34  --   ALKPHOS 71 67 57  --   BILITOT 1.4* 1.1 1.0  --    ------------------------------------------------------------------------------------------------------------------ estimated creatinine clearance is 34.2 mL/min (by C-G formula based on Cr of  1.53). ------------------------------------------------------------------------------------------------------------------ No results for input(s): HGBA1C in  the last 72 hours. ------------------------------------------------------------------------------------------------------------------ No results for input(s): CHOL, HDL, LDLCALC, TRIG, CHOLHDL, LDLDIRECT in the last 72 hours. ------------------------------------------------------------------------------------------------------------------ No results for input(s): TSH, T4TOTAL, T3FREE, THYROIDAB in the last 72 hours.  Invalid input(s): FREET3 ------------------------------------------------------------------------------------------------------------------ No results for input(s): VITAMINB12, FOLATE, FERRITIN, TIBC, IRON, RETICCTPCT in the last 72 hours.  Coagulation profile No results for input(s): INR, PROTIME in the last 168 hours.  No results for input(s): DDIMER in the last 72 hours.  Cardiac Enzymes No results for input(s): CKMB, TROPONINI, MYOGLOBIN in the last 168 hours.  Invalid input(s): CK ------------------------------------------------------------------------------------------------------------------ Invalid input(s): [place]   1. Acute kidney injury-prerenal from severe dehydration Renal function improved with IV hydration continue fluids will order the lab for today.   2. History of bladder cancer and abnormal urinalysis Urine culture negative no antibiotics at this Had history of bladder cancer but did not want an evaluation by urology. Blood clot from penis ,may be due to his agitation and trauma related. 3. Chronic history of diarrhea-extensive workup was done during the previous admission. C. difficile was negative. Patient's diarrhea has resolved , continue Questran.  4.Right thigh mass of unclear etiology, concerning for lipoma versus liposarcoma per MRI , patient was seen by oncologist  and during the previous admission and risks and benefits of further investigations were explained to them. Family decided not to pursue further, outpatient follow-up with Dr. Burlene Arnt as needed  5. Dementia with possible depression patient was seen by Dr. Weber Cooks during previous admission and started on Aricept. Will continue the same. Use Ativan for agitation as needed 0.5 mg every 6 hours. Continue the sitter as needed.   6. Failure to thrive adult with poor by mouth intake-encourage by mouth fluid intake continues to have poor by mouth intake, patient may be appropriate for hospice. The family again. . Added  Megace  7. CODE STATUS full: Palliative care consult patient wanted everything done 1 week prior. At his age and failure to thrive he should be a DO NOT RESUSCITATE.  8.sinus bradycardia: Not on beta blockers. Watch closely. Recommend DO NOT RESUSCITATE status.    Code Status Orders        Start     Ordered   11/25/15 1808  Full code   Continuous     11/25/15 1808    Code Status History    Date Active Date Inactive Code Status Order ID Comments User Context   11/15/2015  9:02 PM 11/22/2015  8:44 PM Full Code [id number]  Fritzi Mandes, MD Inpatient           Consults  none  DVT Prophylaxis  heparin  Lab Results  Component Value Date   PLT 162 11/27/2015     Time Spent in minutes   4 min Allysa Governale M.D on 11/28/2015 at 11:13 AM  Between 7am to 6pm - Pager - [phone]  After 6pm go to [email or web address] - password [email or web address] [facility]   Office  [phone]

## 2015-11-28 NOTE — Progress Notes (Signed)
Pt had a small dark clot looking like blood out of his penis with urination this AM. No complaint of pain.

## 2015-11-29 LAB — BASIC METABOLIC PANEL
ANION GAP: 5 (ref 5–15)
BUN: 24 mg/dL — ABNORMAL HIGH (ref 6–20)
CHLORIDE: 112 mmol/L — AB (ref 101–111)
CO2: 21 mmol/L — ABNORMAL LOW (ref 22–32)
Calcium: 8.2 mg/dL — ABNORMAL LOW (ref 8.9–10.3)
Creatinine, Ser: 1.26 mg/dL — ABNORMAL HIGH (ref 0.61–1.24)
GFR calc non Af Amer: 48 mL/min — ABNORMAL LOW (ref >60)
GFR, EST AFRICAN AMERICAN: 56 mL/min — AB (ref >60)
Glucose, Bld: 109 mg/dL — ABNORMAL HIGH (ref 65–99)
POTASSIUM: 3.9 mmol/L (ref 3.5–5.1)
SODIUM: 138 mmol/L (ref 135–145)

## 2015-11-29 MED ORDER — HYDRALAZINE HCL 20 MG/ML IJ SOLN: 10.0000 mg | INTRAMUSCULAR | Status: DC | PRN

## 2015-11-29 MED ORDER — ENOXAPARIN SODIUM 40 MG/0.4ML ~~LOC~~ SOLN
40.0000 mg | SUBCUTANEOUS | Status: DC
  Administered 2015-11-29: 40 mg via SUBCUTANEOUS
  Filled 2015-11-29 (×2): qty 0.4

## 2015-11-29 MED ORDER — DILTIAZEM HCL 30 MG PO TABS
30.0000 mg | ORAL_TABLET | Freq: Four times a day (QID) | ORAL | Status: DC
  Administered 2015-11-29 – 2015-12-01 (×6): 30 mg via ORAL
  Filled 2015-11-29 (×8): qty 1

## 2015-11-29 MED ORDER — HALOPERIDOL LACTATE 5 MG/ML IJ SOLN
1.0000 mg | Freq: Four times a day (QID) | INTRAMUSCULAR | Status: DC | PRN
  Administered 2015-11-30: 1 mg via INTRAVENOUS
  Filled 2015-11-29: qty 1

## 2015-11-29 MED ORDER — DILTIAZEM HCL 25 MG/5ML IV SOLN
20.0000 mg | INTRAVENOUS | Status: DC | PRN
  Administered 2015-11-29: 20 mg via INTRAVENOUS
  Filled 2015-11-29 (×2): qty 5

## 2015-11-29 NOTE — Progress Notes (Signed)
Pt BP elevated 177/89 after up to BR, manual check 190/90. MD notified and orders given for IV PRN Hydralazine. Parameters are systolic BP greater that 0000000 or diastolic BP greater than [id number].  On recheck BP 141/76, med not administered. Continued to monitor.

## 2015-11-29 NOTE — Progress Notes (Signed)
Patient with telemetry monitoring, HR elevated at times to 140-160s. Patient RN, Gena, had spoken with Dr. Vianne Bulls previously concerning HR, with MD order for PO Cardizem.  Spoke with Dr. Vianne Bulls with above, MD order for 20 mg IV Cardizem PRN q 4-6 hours for HR > 130, notified patient RN, Gena.

## 2015-11-29 NOTE — Telephone Encounter (Signed)
Pt step-daughter wanted to know if the FMLA paperwork was received.. Please advise her @[phone] or [phone]

## 2015-11-29 NOTE — Progress Notes (Signed)
HR >170 sustaining and supervisor called to push IV cardizem, no answer. Nurse called CCU charge and she stated she will attempt to call supervisor again, if not able to get in touch, CCU charge will be over to give IV medication.

## 2015-11-29 NOTE — Progress Notes (Signed)
[place] at [facility]                                                                                                                                                                                            Patient Demographics   [name], is a 82 y.o. male, DOB - [birth year], [id number]  Admit date - 11/25/2015   Admitting Physician Nicholes Mango, MD  Outpatient Primary MD for the patient is [name], MD   LOS - 4  Subjective: continues to have poor po intake. Confused has baseline dementia. Has sitter at bedside . Afebrile ,no new changes. Review of Systems:   CONSTITUTIONAL: Unable to provide any review of system  Vitals:   Filed Vitals:   11/29/15 0227 11/29/15 0358 11/29/15 0426 11/29/15 0721  BP: 157/104 177/89 141/76 155/88  Pulse: 57 58 53 57  Temp:  97.7 F (36.5 C)  98.1 F (36.7 C)  TempSrc:  Axillary  Oral  Resp:  16  16  Height:      Weight:      SpO2: 97% 100%  99%    Wt Readings from Last 3 Encounters:  11/25/15 76.658 kg (169 lb)  11/24/15 68.221 kg (150 lb 6.4 oz)  11/15/15 70.398 kg (155 lb 3.2 oz)     Intake/Output Summary (Last 24 hours) at 11/29/15 1131 Last data filed at 11/29/15 0900  Gross per 24 hour  Intake   1114 ml  Output    400 ml  Net    714 ml    Physical Exam:   GENERAL: Pleasant-appearing in no apparent distress.  HEAD, EYES, EARS, NOSE AND THROAT: Atraumatic, normocephalic. . Pupils equal and reactive to light. Sclerae anicteric. No conjunctival injection. No oro-pharyngeal erythema.  NECK: Supple. There is no jugular venous distention. No bruits, no lymphadenopathy, no thyromegaly.  HEART: Regular rate and rhythm,. No murmurs, no rubs, no clicks.  LUNGS: Clear to auscultation bilaterally. No rales or rhonchi. No wheezes.  ABDOMEN: Soft, flat, nontender, nondistended. Has good bowel sounds. No hepatosplenomegaly appreciated.  EXTREMITIES: No evidence of any cyanosis, clubbing, or  peripheral edema.  +2 pedal and radial pulses bilaterally.  NEUROLOGIC: Unable to do full neurological exam secondary to dementia. SKIN: Moist and warm with no rashes appreciated.  Psych: Sleepy LN: No inguinal LN enlargement    Antibiotics   Anti-infectives    None      Medications   Scheduled Meds: . brinzolamide  1 drop Both Eyes BID  . cholestyramine  4 g Oral BID  . donepezil  10 mg Oral QHS  . dorzolamide-timolol  1 drop Left Eye BID  . dutasteride  0.5 mg Oral Daily  . feeding supplement (ENSURE ENLIVE)  237 mL Oral TID WC  . heparin  5,000 Units Subcutaneous 3 times per day  . latanoprost  1 drop Both Eyes QHS  . megestrol  400 mg Oral BID  . sertraline  25 mg Oral Daily  . sodium chloride flush  3 mL Intravenous Q12H   Continuous Infusions: . dextrose 5 % and 0.45% NaCl 50 mL/hr at 11/29/15 0626   PRN Meds:.[DISCONTINUED] acetaminophen **OR** acetaminophen, acetaminophen, hydrALAZINE, LORazepam, ondansetron **OR** ondansetron (ZOFRAN) IV, oxyCODONE   Data Review:   Micro Results Recent Results (from the past 240 hour(s))  Urine culture     Status: None   Collection Time: 11/25/15 12:09 PM  Result Value Ref Range Status   Specimen Description URINE, CLEAN CATCH  Final   Special Requests NONE  Final   Culture NO GROWTH 2 DAYS  Final   Report Status 11/27/2015 FINAL  Final    Radiology Reports Mr Femur Right W Wo Contrast  11/17/2015  CLINICAL DATA:  Mass along the medial right thigh seen on ultrasound. EXAM: MRI OF THE RIGHT FEMUR WITHOUT AND WITH CONTRAST TECHNIQUE: Multiplanar, multisequence MR imaging of the lower right extremity was performed both before and after administration of intravenous contrast. CONTRAST:  66mL MULTIHANCE GADOBENATE DIMEGLUMINE 529 MG/ML IV SOLN COMPARISON:  11/16/2015 FINDINGS: 28.2 by 13.8 by 10.6 cm fatty mass primarily of the left posterior thigh is present extending from about the level of the knee joint cephalad. This mass  completely surrounds the gracilis muscle, extends posterior and and lateral to the sartorius, envelops the combined semimembranosus and semitendinosus, anteriorly displaces and flattens the adductor magnus, and laterally displaces the biceps femoris. The sciatic nerve is displaced laterally along the margin of this mass and partially surrounded by the mass. Closer to the knee, the mass abuts the distal superficial femoral vessels and popliteal vessels. The mass has internal septations with high T2 signal and septal enhancement internally. I do not observe any definite internal enhancing nodules ; a internal low T1 signal nodule measuring 1.0 by 0.6 cm on image 19 series 3 along the anterior portion of the lesion probably represents a calcification, and several similar tiny nodules are present posteriorly along the upper margin of the lesion, but these do not appreciably enhance. No underlying bony abnormality observed. IMPRESSION: 1. Large fatty mass infiltrating the medial and posterior compartments of the thigh, measuring 28.2 cm in length, and surrounding and displacing multiple structures as noted above. This mass abuts the distal superficial femoral vasculature and sciatic nerve. The mass has the enhancing septal elements and some punctate low signal intensity nonenhancing elements which may be calcifications. Top differential diagnostic considerations include liposarcoma and lipoma. Soft tissue hemangioma is a possibility although the degree of fatty replacement is greater than is typically seen in hemangioma. Electronically Signed   By: Van Clines M.D.   On: 11/17/2015 12:46   US Venous Img Lower Unilateral Right  11/16/2015  CLINICAL DATA:  Right lower extremity edema. History of bladder cancer. Evaluate for DVT. EXAM: RIGHT LOWER EXTREMITY VENOUS DOPPLER ULTRASOUND TECHNIQUE: Gray-scale sonography with graded compression, as well as color Doppler and duplex ultrasound were performed to evaluate  the lower extremity deep venous systems from the level of the common femoral vein and including the common femoral, femoral, profunda femoral, popliteal and calf veins including the posterior tibial, peroneal and gastrocnemius veins when visible.  The superficial great saphenous vein was also interrogated. Spectral Doppler was utilized to evaluate flow at rest and with distal augmentation maneuvers in the common femoral, femoral and popliteal veins. COMPARISON:  None. FINDINGS: Contralateral Common Femoral Vein: Respiratory phasicity is normal and symmetric with the symptomatic side. No evidence of thrombus. Normal compressibility. Common Femoral Vein: No evidence of thrombus. Normal compressibility, respiratory phasicity and response to augmentation. Saphenofemoral Junction: No evidence of thrombus. Normal compressibility and flow on color Doppler imaging. Profunda Femoral Vein: No evidence of thrombus. Normal compressibility and flow on color Doppler imaging. Femoral Vein: No evidence of thrombus. Normal compressibility, respiratory phasicity and response to augmentation. Popliteal Vein: No evidence of thrombus. Normal compressibility, respiratory phasicity and response to augmentation. Calf Veins: No evidence of thrombus. Normal compressibility and flow on color Doppler imaging. Superficial Great Saphenous Vein: No evidence of thrombus. Normal compressibility and flow on color Doppler imaging. Venous Reflux:  None. Other Findings: There is a slightly ill-defined approximately 7.9 x 2.2 x 5.5 cm lesion apparently within the musculature of the right thigh. The caudal aspect of this structure demonstrates internal echogenicity similar to the surrounding fat and may represent a lipoma, however there are several complex components within its cranial aspect with dominant partially shadowing solid component measuring approximately 1.9 x 1.7 x 1.6 cm (images 42 and 44) and an adjacent complex cystic appearing component  measuring approximately 1.4 x 1.1 x 1.8 cm (images 38 and 40). IMPRESSION: 1. No evidence of DVT within the right lower extremity. 2. Indeterminate complex approximately 7.9 cm partially cystic structure within the musculature of the right thigh. Differential considerations remain broad and include (but are not limited to) an atypical lipoma, soft tissue sarcoma, metastatic disease, abscess and evolving hematoma. Clinical correlation is advised. Further evaluation with MRI could be performed as clinically indicated. These results will be called to the ordering clinician or representative by the Radiologist Assistant, and communication documented in the PACS or zVision Dashboard. Electronically Signed   By: Sandi Mariscal M.D.   On: 11/16/2015 10:53     CBC  Recent Labs Lab 11/24/15 1653 11/25/15 0942 11/26/15 0455 11/27/15 0536  WBC 12.5* 8.2 5.6 3.8  HGB 13.6 12.9* 11.9* 12.2*  HCT 39.8 38.5* 35.6* 35.8*  PLT 305 208 176 162  MCV 93.9 94.4 93.7 93.5  MCH 32.1 31.6 31.3 31.7  MCHC 34.2 33.4 33.4 33.9  RDW 15.8* 16.3* 16.1* 15.9*  LYMPHSABS  --  1.1  --   --   MONOABS  --  0.8  --   --   EOSABS  --  0.1  --   --   BASOSABS  --  0.0  --   --     Chemistries   Recent Labs Lab 11/24/15 1653 11/25/15 0942 11/26/15 0455 11/27/15 0536 11/29/15 0534  NA 138 141 143 144 138  K 6.8* 4.9 4.8 4.3 3.9  CL 102 110 115* 117* 112*  CO2 19* 21* 20* 21* 21*  GLUCOSE 95 115* 119* 106* 109*  BUN 84* 99* 79* 47* 24*  CREATININE 4.51* 4.65* 2.52* 1.53* 1.26*  CALCIUM 8.8 8.4* 8.3* 8.1* 8.2*  AST 31 39 30  --   --   ALT 37 40 34  --   --   ALKPHOS 71 67 57  --   --   BILITOT 1.4* 1.1 1.0  --   --    ------------------------------------------------------------------------------------------------------------------ estimated creatinine clearance is 41.5 mL/min (by C-G formula based on Cr of  1.26). ------------------------------------------------------------------------------------------------------------------ No results for input(s): HGBA1C in the last 72 hours. ------------------------------------------------------------------------------------------------------------------ No results for input(s): CHOL, HDL, LDLCALC, TRIG, CHOLHDL, LDLDIRECT in the last 72 hours. ------------------------------------------------------------------------------------------------------------------ No results for input(s): TSH, T4TOTAL, T3FREE, THYROIDAB in the last 72 hours.  Invalid input(s): FREET3 ------------------------------------------------------------------------------------------------------------------ No results for input(s): VITAMINB12, FOLATE, FERRITIN, TIBC, IRON, RETICCTPCT in the last 72 hours.  Coagulation profile No results for input(s): INR, PROTIME in the last 168 hours.  No results for input(s): DDIMER in the last 72 hours.  Cardiac Enzymes No results for input(s): CKMB, TROPONINI, MYOGLOBIN in the last 168 hours.  Invalid input(s): CK ------------------------------------------------------------------------------------------------------------------ Invalid input(s): [place]elle   1. Acute kidney injury-prerenal from severe dehydration Renal function improved with IV hydration , but because of poor oral intake patient can have recurrent renal failure.  2. History of bladder cancer and abnormal urinalysis Urine culture negative no antibiotics at this Had history of bladder cancer but did not want an evaluation by urology. Blood clot from penis ,may be due to his agitation and trauma related. 3. Chronic history of diarrhea-extensive workup was done during the previous admission. C. difficile was negative. Patient's diarrhea has resolved , continue Questran.  4.Right thigh mass of unclear etiology, concerning for lipoma versus liposarcoma per MRI ,  patient was seen by oncologist and during the previous admission and risks and benefits of further investigations were explained to them. Family decided not to pursue further, outpatient follow-up with Dr. Burlene Arnt as needed  5. Dementia with possible depression patient was seen by Dr. Weber Cooks during previous admission and started on Aricept. Will continue the same. Use Ativan for agitation as needed 0.5 mg every 6 hours. Continue the sitter as needed.   6. Failure to thrive adult with poor by mouth intake-encourage by mouth fluid intake continues to have poor by mouth intake, patient may be appropriate for hospice. The family again. . Added  Megace  7. CODE STATUS full: Palliative care consult patient wanted everything done 1 week prior.  Due to advanced age, and failure to thrive he should be a DO NOT RESUSCITATE.  8.sinus bradycardia: Not on beta blockers. Watch closely. Recommend DO NOT RESUSCITATE status.    Code Status Orders        Start     Ordered   11/25/15 1808  Full code   Continuous     11/25/15 1808    Code Status History    Date Active Date Inactive Code Status Order ID Comments User Context   11/15/2015  9:02 PM 11/22/2015  8:44 PM Full Code [id number]  Fritzi Mandes, MD Inpatient           Consults  none  DVT Prophylaxis  heparin  Lab Results  Component Value Date   PLT 162 11/27/2015     Time Spent in minutes   61 min Priscillia Fouch M.D on 11/29/2015 at 11:31 AM  Between 7am to 6pm - Pager - [phone]  After 6pm go to [email or web address] - password [email or web address] [facility]   Office  [phone]

## 2015-11-29 NOTE — Plan of Care (Signed)
Problem: Nutrition: Goal: Adequate nutrition will be maintained Outcome: Not Progressing Pt is not eating very much.

## 2015-11-29 NOTE — Progress Notes (Signed)
Paged MD regarding HR staying elevated. Earlier this AM HR was increased when pt would stand and move around the room with the sitter. HR has stayed elevated even after pt had sat back down. MD stated she would place orders.

## 2015-11-29 NOTE — Progress Notes (Signed)
Pt admitted with HX of diarrhea X 2 weeks, order for RO CDiff placed. Pt had not had a BM since admission except for a small clear mucous smear last shift. Order for RO CDIFF  expired this AM. MD notified and enteric precautions DC'ed.

## 2015-11-29 NOTE — Care Management Important Message (Signed)
Important Message  Patient Details  Name: [name] MRN: [id number] Date of Birth: 12/02/1922   Medicare Important Message Given:  Yes    Juliann Pulse A Carrie Usery 11/29/2015, 3:10 PM

## 2015-11-30 DIAGNOSIS — Z7689 Persons encountering health services in other specified circumstances: Secondary | ICD-10-CM

## 2015-11-30 MED ORDER — METOPROLOL TARTRATE 1 MG/ML IV SOLN
5.0000 mg | Freq: Once | INTRAVENOUS | Status: AC
  Administered 2015-11-30: 5 mg via INTRAVENOUS
  Filled 2015-11-30: qty 5

## 2015-11-30 NOTE — Progress Notes (Signed)
[place] at [facility]                                                                                                                                                                                            Patient Demographics   [name], is a 82 y.o. male, DOB - 08/19/1923, [id number]  Admit date - 11/25/2015   Admitting Physician Nicholes Mango, MD  Outpatient Primary MD for the patient is [name], MD   LOS - 5  Subjective: demented,  sitter at bedside.  Review of Systems:   CONSTITUTIONAL: Unable to provide any review of system  Vitals:   Filed Vitals:   11/30/15 0132 11/30/15 0147 11/30/15 0201 11/30/15 1314  BP: 147/75 129/79 130/73 156/76  Pulse: 56 56 55 70  Temp:    98.3 F (36.8 C)  TempSrc:    Oral  Resp:    17  Height:      Weight:      SpO2: 98% 98% 97% 98%    Wt Readings from Last 3 Encounters:  11/25/15 76.658 kg (169 lb)  11/24/15 68.221 kg (150 lb 6.4 oz)  11/15/15 70.398 kg (155 lb 3.2 oz)     Intake/Output Summary (Last 24 hours) at 11/30/15 1517 Last data filed at 11/30/15 1331  Gross per 24 hour  Intake 1226.5 ml  Output    350 ml  Net  876.5 ml    Physical Exam:   GENERAL: Pleasant-appearing in no apparent distress.  HEAD, EYES, EARS, NOSE AND THROAT: Atraumatic, normocephalic. . Pupils equal and reactive to light. Sclerae anicteric. No conjunctival injection. No oro-pharyngeal erythema.  NECK: Supple. There is no jugular venous distention. No bruits, no lymphadenopathy, no thyromegaly.  HEART: Regular rate and rhythm,. No murmurs, no rubs, no clicks.  LUNGS: Clear to auscultation bilaterally. No rales or rhonchi. No wheezes.  ABDOMEN: Soft, flat, nontender, nondistended. Has good bowel sounds. No hepatosplenomegaly appreciated.  EXTREMITIES: No evidence of any cyanosis, clubbing, or peripheral edema.  +2 pedal and radial pulses bilaterally.  NEUROLOGIC: Unable to do full neurological exam  secondary to dementia. SKIN: Moist and warm with no rashes appreciated.  Psych: demented. LN: No inguinal LN enlargement    Antibiotics   Anti-infectives    None      Medications   Scheduled Meds: . brinzolamide  1 drop Both Eyes BID  . cholestyramine  4 g Oral BID  . diltiazem  30 mg Oral 4 times per day  . donepezil  10 mg Oral QHS  . dorzolamide-timolol  1 drop Left Eye BID  . dutasteride  0.5 mg Oral Daily  . enoxaparin (LOVENOX) injection  40 mg Subcutaneous Q24H  . feeding supplement (ENSURE ENLIVE)  237 mL Oral TID WC  . latanoprost  1 drop Both Eyes QHS  . megestrol  400 mg Oral BID  . sertraline  25 mg Oral Daily  . sodium chloride flush  3 mL Intravenous Q12H   Continuous Infusions: . dextrose 5 % and 0.45% NaCl 50 mL/hr at 11/30/15 0654   PRN Meds:.[DISCONTINUED] acetaminophen **OR** acetaminophen, acetaminophen, diltiazem, haloperidol lactate, hydrALAZINE, LORazepam, ondansetron **OR** ondansetron (ZOFRAN) IV   Data Review:   Micro Results Recent Results (from the past 240 hour(s))  Urine culture     Status: None   Collection Time: 11/25/15 12:09 PM  Result Value Ref Range Status   Specimen Description URINE, CLEAN CATCH  Final   Special Requests NONE  Final   Culture NO GROWTH 2 DAYS  Final   Report Status 11/27/2015 FINAL  Final    Radiology Reports Mr Femur Right W Wo Contrast  11/17/2015  CLINICAL DATA:  Mass along the medial right thigh seen on ultrasound. EXAM: MRI OF THE RIGHT FEMUR WITHOUT AND WITH CONTRAST TECHNIQUE: Multiplanar, multisequence MR imaging of the lower right extremity was performed both before and after administration of intravenous contrast. CONTRAST:  61mL MULTIHANCE GADOBENATE DIMEGLUMINE 529 MG/ML IV SOLN COMPARISON:  11/16/2015 FINDINGS: 28.2 by 13.8 by 10.6 cm fatty mass primarily of the left posterior thigh is present extending from about the level of the knee joint cephalad. This mass completely surrounds the gracilis  muscle, extends posterior and and lateral to the sartorius, envelops the combined semimembranosus and semitendinosus, anteriorly displaces and flattens the adductor magnus, and laterally displaces the biceps femoris. The sciatic nerve is displaced laterally along the margin of this mass and partially surrounded by the mass. Closer to the knee, the mass abuts the distal superficial femoral vessels and popliteal vessels. The mass has internal septations with high T2 signal and septal enhancement internally. I do not observe any definite internal enhancing nodules ; a internal low T1 signal nodule measuring 1.0 by 0.6 cm on image 19 series 3 along the anterior portion of the lesion probably represents a calcification, and several similar tiny nodules are present posteriorly along the upper margin of the lesion, but these do not appreciably enhance. No underlying bony abnormality observed. IMPRESSION: 1. Large fatty mass infiltrating the medial and posterior compartments of the thigh, measuring 28.2 cm in length, and surrounding and displacing multiple structures as noted above. This mass abuts the distal superficial femoral vasculature and sciatic nerve. The mass has the enhancing septal elements and some punctate low signal intensity nonenhancing elements which may be calcifications. Top differential diagnostic considerations include liposarcoma and lipoma. Soft tissue hemangioma is a possibility although the degree of fatty replacement is greater than is typically seen in hemangioma. Electronically Signed   By: Van Clines M.D.   On: 11/17/2015 12:46   US Venous Img Lower Unilateral Right  11/16/2015  CLINICAL DATA:  Right lower extremity edema. History of bladder cancer. Evaluate for DVT. EXAM: RIGHT LOWER EXTREMITY VENOUS DOPPLER ULTRASOUND TECHNIQUE: Gray-scale sonography with graded compression, as well as color Doppler and duplex ultrasound were performed to evaluate the lower extremity deep venous  systems from the level of the common femoral vein and including the common femoral, femoral, profunda femoral, popliteal and calf veins including the posterior tibial, peroneal and gastrocnemius veins when visible. The superficial great saphenous vein was also interrogated.  Spectral Doppler was utilized to evaluate flow at rest and with distal augmentation maneuvers in the common femoral, femoral and popliteal veins. COMPARISON:  None. FINDINGS: Contralateral Common Femoral Vein: Respiratory phasicity is normal and symmetric with the symptomatic side. No evidence of thrombus. Normal compressibility. Common Femoral Vein: No evidence of thrombus. Normal compressibility, respiratory phasicity and response to augmentation. Saphenofemoral Junction: No evidence of thrombus. Normal compressibility and flow on color Doppler imaging. Profunda Femoral Vein: No evidence of thrombus. Normal compressibility and flow on color Doppler imaging. Femoral Vein: No evidence of thrombus. Normal compressibility, respiratory phasicity and response to augmentation. Popliteal Vein: No evidence of thrombus. Normal compressibility, respiratory phasicity and response to augmentation. Calf Veins: No evidence of thrombus. Normal compressibility and flow on color Doppler imaging. Superficial Great Saphenous Vein: No evidence of thrombus. Normal compressibility and flow on color Doppler imaging. Venous Reflux:  None. Other Findings: There is a slightly ill-defined approximately 7.9 x 2.2 x 5.5 cm lesion apparently within the musculature of the right thigh. The caudal aspect of this structure demonstrates internal echogenicity similar to the surrounding fat and may represent a lipoma, however there are several complex components within its cranial aspect with dominant partially shadowing solid component measuring approximately 1.9 x 1.7 x 1.6 cm (images 42 and 44) and an adjacent complex cystic appearing component measuring approximately 1.4 x 1.1  x 1.8 cm (images 38 and 40). IMPRESSION: 1. No evidence of DVT within the right lower extremity. 2. Indeterminate complex approximately 7.9 cm partially cystic structure within the musculature of the right thigh. Differential considerations remain broad and include (but are not limited to) an atypical lipoma, soft tissue sarcoma, metastatic disease, abscess and evolving hematoma. Clinical correlation is advised. Further evaluation with MRI could be performed as clinically indicated. These results will be called to the ordering clinician or representative by the Radiologist Assistant, and communication documented in the PACS or zVision Dashboard. Electronically Signed   By: Sandi Mariscal M.D.   On: 11/16/2015 10:53     CBC  Recent Labs Lab 11/24/15 1653 11/25/15 0942 11/26/15 0455 11/27/15 0536  WBC 12.5* 8.2 5.6 3.8  HGB 13.6 12.9* 11.9* 12.2*  HCT 39.8 38.5* 35.6* 35.8*  PLT 305 208 176 162  MCV 93.9 94.4 93.7 93.5  MCH 32.1 31.6 31.3 31.7  MCHC 34.2 33.4 33.4 33.9  RDW 15.8* 16.3* 16.1* 15.9*  LYMPHSABS  --  1.1  --   --   MONOABS  --  0.8  --   --   EOSABS  --  0.1  --   --   BASOSABS  --  0.0  --   --     Chemistries   Recent Labs Lab 11/24/15 1653 11/25/15 0942 11/26/15 0455 11/27/15 0536 11/29/15 0534  NA 138 141 143 144 138  K 6.8* 4.9 4.8 4.3 3.9  CL 102 110 115* 117* 112*  CO2 19* 21* 20* 21* 21*  GLUCOSE 95 115* 119* 106* 109*  BUN 84* 99* 79* 47* 24*  CREATININE 4.51* 4.65* 2.52* 1.53* 1.26*  CALCIUM 8.8 8.4* 8.3* 8.1* 8.2*  AST 31 39 30  --   --   ALT 37 40 34  --   --   ALKPHOS 71 67 57  --   --   BILITOT 1.4* 1.1 1.0  --   --    ------------------------------------------------------------------------------------------------------------------ estimated creatinine clearance is 41.5 mL/min (by C-G formula based on Cr of 1.26). ------------------------------------------------------------------------------------------------------------------ No results for  input(s): HGBA1C  in the last 72 hours. ------------------------------------------------------------------------------------------------------------------ No results for input(s): CHOL, HDL, LDLCALC, TRIG, CHOLHDL, LDLDIRECT in the last 72 hours. ------------------------------------------------------------------------------------------------------------------ No results for input(s): TSH, T4TOTAL, T3FREE, THYROIDAB in the last 72 hours.  Invalid input(s): FREET3 ------------------------------------------------------------------------------------------------------------------ No results for input(s): VITAMINB12, FOLATE, FERRITIN, TIBC, IRON, RETICCTPCT in the last 72 hours.  Coagulation profile No results for input(s): INR, PROTIME in the last 168 hours.  No results for input(s): DDIMER in the last 72 hours.  Cardiac Enzymes No results for input(s): CKMB, TROPONINI, MYOGLOBIN in the last 168 hours.  Invalid input(s): CK ------------------------------------------------------------------------------------------------------------------ Invalid input(s): [place]   1. Acute kidney injury-prerenal from severe dehydration Renal function improved with IV hydration , but because of poor oral intake patient can have recurrent renal failure.  2. History of bladder cancer and abnormal urinalysis Urine culture negative no antibiotics. Had history of bladder cancer but did not want an evaluation by urology. Blood clot from penis ,may be due to his agitation and trauma related.  3. Chronic history of diarrhea-extensive workup was done during the previous admission. C. difficile was negative. Patient's diarrhea has resolved , continue Questran.  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 Family  decided not to pursue further, outpatient follow-up with Dr. Burlene Arnt as needed  5. Dementia with possible depression patient was seen by Dr. Weber Cooks during previous admission and started on Aricept. continue the same. Use Ativan for agitation as needed 0.5 mg every 6 hours. Try discontinue sitter.   6. Failure to thrive adult with poor by mouth intake-encourage by mouth fluid intake continues to have poor by mouth intake, patient may be appropriate for hospice. . Added  Megace  7.sinus bradycardia: Not on beta blockers. Improved.  CODE STATUS full code.  Palliative care consult patient wanted everything done 1 week prior.  Due to advanced age, and failure to thrive he should be a DO NOT RESUSCITATE. Recommend DO NOT RESUSCITATE status.    Code Status Orders        Start     Ordered   11/25/15 1808  Full code   Continuous     11/25/15 1808    Code Status History    Date Active Date Inactive Code Status Order ID Comments User Context   11/15/2015  9:02 PM 11/22/2015  8:44 PM Full Code [id number]  Fritzi Mandes, MD Inpatient           Consults  none  DVT Prophylaxis  heparin  Lab Results  Component Value Date   PLT 162 11/27/2015   All the records are reviewed and case discussed with Care Management/Social Workerr. Management plans discussed with the patient, family and they are in agreement.  CODE STATUS: Full code  TOTAL TIME TAKING CARE OF THIS PATIENT: 36  minutes.  Greater than 50% time was spent on coordination of care and face-to-face counseling.  Demetrios Loll M.D on 11/30/2015 at 3:17 PM  Between 7am to 6pm - Pager - [phone]  After 6pm go to [email or web address] - password [email or web address] [place] [facility]   Office  [phone]

## 2015-11-30 NOTE — Progress Notes (Signed)
Nutrition Follow-up      INTERVENTION:  Meals and snacks: Cater to pt prefrences Medical Nutrition Supplement Therapy: continue ensure TID for added nutrition. Called kitchen to verify in system Coordination of care: If aggressive nutrition therapy is wanted pt may benefit from nutrition support (not meeting nutritional needs day 5)    NUTRITION DIAGNOSIS:   Inadequate oral intake related to poor appetite, lethargy/confusion as evidenced by meal completion < 25%.    GOAL:   Patient will meet greater than or equal to 90% of their needs  Not meeting nutritional goals at this time   MONITOR:    (Energy intake)  REASON FOR ASSESSMENT:   Consult Poor PO  ASSESSMENT:       Current Nutrition: per CNA ate 1 sherbet today and that is it.     Gastrointestinal Profile: Last BM: 2/5   Scheduled Medications:  . brinzolamide  1 drop Both Eyes BID  . cholestyramine  4 g Oral BID  . diltiazem  30 mg Oral 4 times per day  . donepezil  10 mg Oral QHS  . dorzolamide-timolol  1 drop Left Eye BID  . dutasteride  0.5 mg Oral Daily  . enoxaparin (LOVENOX) injection  40 mg Subcutaneous Q24H  . feeding supplement (ENSURE ENLIVE)  237 mL Oral TID WC  . latanoprost  1 drop Both Eyes QHS  . megestrol  400 mg Oral BID  . sertraline  25 mg Oral Daily  . sodium chloride flush  3 mL Intravenous Q12H    Continuous Medications:  . dextrose 5 % and 0.45% NaCl 50 mL/hr at 11/30/15 0654     Electrolyte/Renal Profile and Glucose Profile:   Recent Labs Lab 11/26/15 0455 11/27/15 0536 11/29/15 0534  NA 143 144 138  K 4.8 4.3 3.9  CL 115* 117* 112*  CO2 20* 21* 21*  BUN 79* 47* 24*  CREATININE 2.52* 1.53* 1.26*  CALCIUM 8.3* 8.1* 8.2*  GLUCOSE 119* 106* 109*   Protein Profile:  Recent Labs Lab 11/24/15 1653 11/25/15 0942 11/26/15 0455  ALBUMIN 4.4 4.0 3.5     Nutrition-Focused Physical Exam Findings: deferred at this time, pt agaitated   Weight Trend since  Admission: [facility] Weights   11/25/15 0926  Weight: 169 lb (76.658 kg)       Diet Order:  DIET DYS 3 Room service appropriate?: Yes; Fluid consistency:: Thin  Skin:   reviewed   Height:   Ht Readings from Last 1 Encounters:  11/25/15 5\' 11"  (1.803 m)    Weight:   Wt Readings from Last 1 Encounters:  11/25/15 169 lb (76.658 kg)    Ideal Body Weight:     BMI:  Body mass index is 23.58 kg/(m^2).  Estimated Nutritional Needs:   Kcal:  BEE 1301 kcals (IF 1.1-1.3, AF 1.2) [id number] kcals/d.   Protein:  70-84 g/kg (1.0-1.2 g/kg)   Fluid:  1760-2152ml /d (25-4ml/kg)   EDUCATION NEEDS:   No education needs identified at this time  [place]. Zenia Resides, [place], [place] (pager) Weekend/On-Call pager [phone])

## 2015-11-30 NOTE — Telephone Encounter (Signed)
LM for patients daughter to return call and see if she would like the forms faxed or if she would like to pick them up.

## 2015-11-30 NOTE — Progress Notes (Signed)
PT Cancellation Note  Patient Details Name: [name] MRN: [id number] DOB: March 29, 1923   Cancelled Treatment:    Reason Eval/Treat Not Completed: Patient declined, no reason specified;  Pt with agitation overnight and given Ativan and Haldol then had an episode of HR in 180's and BP up to 155/110.  Pt now stable and resting and appears to be very tired.  Pt RN report pt stable while ambulating to BR this shift.  Will continue to follow as appropriate.  Kyliee Ortego A Labarron Durnin, PT 11/30/2015, 12:29 PM

## 2015-11-30 NOTE — Telephone Encounter (Signed)
Form faxed

## 2015-11-30 NOTE — Telephone Encounter (Signed)
Form completed and placed on [name]'s desk.

## 2015-11-30 NOTE — Progress Notes (Signed)
Pt.alert and able to make needs known but  agitated and trying to get out of bed for the greater part of the shift. Ativan 0.5mg  given around 22:30 but did not seem to relax him. Pt. Kept trying to get out of bed stating that he "just wanted to stand up for a little bit". We tried to pacify him by getting him up and taking him to the BR at least 2 times per hour but this did not help much either. Around 00:00 I gave the Pt. Haldol 1 mg, IV push and by 00:08 he was trying to get out of bed, fighting the sitter when CCU called about his HR being in the 180's. BP was also 155/110. MD was notified and Metoprolol 5 mg IV push was ordered and administered. STAT EKG was also ordered. Pt HR came down into the 140'S/150's over 70's. Pt. Slept for a couple hours but got up and started all over trying to get out of bed.

## 2015-11-30 NOTE — Telephone Encounter (Signed)
Pt's daughter called back and she wants the papers faxed to her job the fax number is [phone] Attn: Iona Coach

## 2015-11-30 NOTE — Care Management (Signed)
Patient for ALF CSW following.  PT consult pending.  Palliative consult pending.   Patient has safety sitter st bedside.

## 2015-12-01 DIAGNOSIS — Z8551 Personal history of malignant neoplasm of bladder: Secondary | ICD-10-CM

## 2015-12-01 DIAGNOSIS — N179 Acute kidney failure, unspecified: Secondary | ICD-10-CM

## 2015-12-01 DIAGNOSIS — E43 Unspecified severe protein-calorie malnutrition: Secondary | ICD-10-CM

## 2015-12-01 DIAGNOSIS — G309 Alzheimer's disease, unspecified: Secondary | ICD-10-CM

## 2015-12-01 DIAGNOSIS — Z515 Encounter for palliative care: Secondary | ICD-10-CM

## 2015-12-01 DIAGNOSIS — R2231 Localized swelling, mass and lump, right upper limb: Secondary | ICD-10-CM

## 2015-12-01 MED ORDER — DILTIAZEM HCL ER COATED BEADS 120 MG PO CP24
120.0000 mg | ORAL_CAPSULE | Freq: Every day | ORAL | Status: DC
Start: 2015-12-01 — End: 2015-12-01
  Administered 2015-12-01: 120 mg via ORAL
  Filled 2015-12-01: qty 1

## 2015-12-01 MED ORDER — ALPRAZOLAM 0.25 MG PO TABS: 0.2500 mg | ORAL_TABLET | Freq: Three times a day (TID) | ORAL | Status: DC | PRN

## 2015-12-01 MED ORDER — DILTIAZEM HCL ER COATED BEADS 120 MG PO CP24: 120.0000 mg | ORAL_CAPSULE | Freq: Every day | ORAL | Status: DC

## 2015-12-01 NOTE — Progress Notes (Signed)
Patient discharged via stretcher escorted by EMS.   Almedia Balls, RN

## 2015-12-01 NOTE — Evaluation (Signed)
Physical Therapy Evaluation Patient Details Name: [name] MRN: [id number] DOB: May 09, 1923 Today's Date: 12/01/2015   History of Present Illness  [name] is a 82 y.o. male with a known history of dementia, bladder cancer, hyperlipidemia, glaucoma and hypertension and other medical problems was just recently discharged from the hospital after treating for diarrhea, severe protein energy malnutrition to an assisted living facility. Family members think that patient is depressed and not drinking enough, seen by primary care physician yesterday and lab work was done. PCP send the patient to the hospital for abnormal labs today. Patient's creatinine is elevated to 4.65 and BUN is at 99. Hospitalist team is called to admit the patient and patient has received 1500 cc IV fluid bolus in the ED. Pt is known to therapist from prior admission. PT attempted to see patient yesterday but was unable due to lethargy/confusion. Per RN and family this has improved today and pt appears to be back to baseline. Pt unable to provide meaningful history so obtained from family and medical record  Clinical Impression  Pt demonstrates good strength and mobility. He requires a couple attempts to stand for the first time but after that is able to transfer with ease. Pt ambulates 2 laps around RN station however does appear slightly more deconditioned than last admission. Pt would benefit from [facility] at [facility]. Will need to discharge back to locked memory care unit. Does not meet PT criteria for SNF placement. Pt will benefit from skilled PT services to address deficits in strength, balance, and mobility in order to return to full function at home.     Follow Up Recommendations Home health PT;Supervision/Assistance - 24 hour (Return to [facility])    Equipment Recommendations  None recommended by PT    Recommendations for Other Services       Precautions / Restrictions Precautions Precautions:  Fall Restrictions Weight Bearing Restrictions: No      Mobility  Bed Mobility Overal bed mobility: Needs Assistance Bed Mobility: Supine to Sit;Sit to Supine     Supine to sit: Supervision Sit to supine: Supervision   General bed mobility comments: Pt takes extended time to performw ith HOB elevated and use of bed rails. Needs cues for positioning and hand placement when scooting up toward [facility]  Transfers Overall transfer level: Needs assistance Equipment used: Rolling walker (2 wheeled) Transfers: Sit to/from Stand Sit to Stand: Min guard         General transfer comment: Pt demonstrates improving strength with transfers as session progresses. First attempt he requires 3 repetitions to come to full standing. Poor hand placement and poor anterior weight shifting. Subsequent transfers performed with more ease.  Ambulation/Gait Ambulation/Gait assistance: Min guard Ambulation Distance (Feet): 400 Feet Assistive device: Rolling walker (2 wheeled) Gait Pattern/deviations: Step-through pattern Gait velocity: 10'=7.25 seconds Gait velocity interpretation: <1.8 ft/sec, indicative of risk for recurrent falls General Gait Details: Gait speed slowly improves as gait distance increases. Cues for upright posture and fatigue monitored throughout ambulation. Pt with decreased floor to toe clearance and forward flexed trunk but overall gait is functional for facility ambulation. Pt performs some limited stepping without assistive device  Stairs            Wheelchair Mobility    Modified Rankin (Stroke Patients Only)       Balance Overall balance assessment: Needs assistance Sitting-balance support: No upper extremity supported Sitting balance-Leahy Scale: Good     Standing balance support: No upper extremity supported Standing  balance-Leahy Scale: Fair                               Pertinent Vitals/Pain Pain Assessment: Faces Faces Pain Scale: No hurt Pain  Location: Pt also denies pain currently    Home Living Family/patient expects to be discharged to:: Assisted living ([place]) Living Arrangements: Alone Available Help at Discharge: Family Type of Home: House Home Access: Stairs to enter Entrance Stairs-Rails: Right Entrance Stairs-Number of Steps: 4 Home Layout: Able to live on main level with bedroom/bathroom;Laundry or work area in [place]: Environmental consultant - standard;Cane - single point      Prior Function Level of Independence: Independent         Comments: Assist with IADLs. Step-daughter states that pt is not caring for himself well. She does not believe he bathes himself. Since his last admission pt has been at [facility] care unit     Hand Dominance   Dominant Hand: Right    Extremity/Trunk Assessment   Upper Extremity Assessment: Overall WFL for tasks assessed;Difficult to assess due to impaired cognition (Grossly 4 to 4+/5. Bilateral shoulder flexion AROM loss)           Lower Extremity Assessment: Overall WFL for tasks assessed;Difficult to assess due to impaired cognition (Grossly 4 to 4+/5. )         Communication   Communication: No difficulties  Cognition Arousal/Alertness: Awake/alert Behavior During Therapy: WFL for tasks assessed/performed Overall Cognitive Status: History of cognitive impairments - at baseline (AOx1 at time of evaluation. Baseline confusion, mildly worse)                      General Comments      Exercises General Exercises - Lower Extremity Long Arc Quad: Strengthening;Both;Seated;10 reps Heel Slides: Strengthening;Both;Seated;10 reps Hip ABduction/ADduction: Strengthening;Both;Seated;10 reps (Performed x 10 in supine as well) Hip Flexion/Marching: Strengthening;Both;Seated;10 reps      Assessment/Plan    PT Assessment Patient needs continued PT services  PT Diagnosis Generalized weakness   PT Problem List Decreased  strength;Decreased balance;Decreased safety awareness  PT Treatment Interventions Gait training;Stair training;Therapeutic activities;Therapeutic exercise;Balance training;Neuromuscular re-education;Cognitive remediation;Patient/family education;DME instruction   PT Goals (Current goals can be found in the Care Plan section) Acute Rehab PT Goals PT Goal Formulation: Patient unable to participate in goal setting    Frequency Min 2X/week   Barriers to discharge        Co-evaluation               End of Session Equipment Utilized During Treatment: Gait belt Activity Tolerance: Patient tolerated treatment well Patient left: in bed;with call bell/phone within reach;with bed alarm set;with family/visitor present Nurse Communication: Mobility status (Need to have diaper changed)         Time: [id number] PT Time Calculation (min) (ACUTE ONLY): 25 min   Charges:   PT Evaluation $PT Eval Moderate Complexity: 1 Procedure PT Treatments $Therapeutic Exercise: 8-22 mins   PT G Codes:       Lyndel Safe Huprich PT, DPT   Huprich,Jason 12/01/2015, 2:21 PM

## 2015-12-01 NOTE — Discharge Instructions (Signed)
Heart healthy diet. Activity as tolerated. Aspiration and fall precaution. Palliative care consult in facility

## 2015-12-01 NOTE — NC FL2 (Signed)
[place] LEVEL OF CARE SCREENING TOOL     IDENTIFICATION  Patient Name: [name] Birthdate: 09-13-1923 Sex: male Admission Date (Current Location): 11/25/2015  [place] and [place] Number:  Engineering geologist and Address:  [facility], [address], [place], [place] [address]      Provider Number: [id number]  Attending Physician Name and Address:  Demetrios Loll, MD  Relative Name and Phone Number:       Current Level of Care: Hospital Recommended Level of Care: [place], Memory Care Prior Approval Number:    Date Approved/Denied:   PASRR Number:    Discharge Plan:  (ALF memory care)    Current Diagnoses: Patient Active Problem List   Diagnosis Date Noted  . Diarrhea 11/25/2015  . Acute kidney injury ([place]) 11/25/2015  . Protein-calorie malnutrition, severe 11/17/2015  . UTI (lower urinary tract infection) 11/15/2015  . Branch retinal vein occlusion 11/11/2015  . Encounter for wound re-check 11/02/2015  . Fall with injury to face 10/26/2015  . Cataract, post subcapsular polar senile 10/04/2015  . Cataracts, bilateral 10/01/2015  . Essential hypertension 08/27/2015  . Dementia 08/27/2015  . Primary open angle glaucoma 02/17/2015  . Bladder cancer ([place]) 11/17/2013  . Gross hematuria 11/17/2013  . Cataract, nuclear sclerotic senile 07/31/2011  . Dyslipidemia 06/09/2011  . PVC (premature ventricular contraction) 06/09/2011    Orientation RESPIRATION BLADDER Height & Weight     Self  Normal Continent Weight: 169 lb (76.658 kg) Height:  5\' 11"  (180.3 cm)  BEHAVIORAL SYMPTOMS/MOOD NEUROLOGICAL BOWEL NUTRITION STATUS   (none in hospital)  (none) Incontinent Diet  AMBULATORY STATUS COMMUNICATION OF NEEDS Skin   Supervision Verbally Normal                       Personal Care Assistance Level of Assistance  Bathing, Dressing Bathing Assistance: Limited assistance Feeding assistance: Limited  assistance Dressing Assistance: Limited assistance     Functional Limitations Info  Sight, Hearing, Speech Sight Info: Impaired Hearing Info: Adequate Speech Info: Impaired    SPECIAL CARE FACTORS FREQUENCY                       Contractures Contractures Info: Not present    Additional Factors Info  Allergies   Allergies Info: NKA           DISCHARGE MEDICATIONS:   Current Discharge Medication List    START taking these medications   Details  ALPRAZolam (XANAX) 0.25 MG tablet Take 1 tablet (0.25 mg total) by mouth 3 (three) times daily as needed for anxiety or sleep. Qty: 15 tablet, Refills: 0    diltiazem (CARDIZEM CD) 120 MG 24 hr capsule Take 1 capsule (120 mg total) by mouth daily. Qty: 30 capsule, Refills: 0      CONTINUE these medications which have NOT CHANGED   Details  acetaminophen (TYLENOL) 325 MG tablet Take 2 tablets (650 mg total) by mouth every 6 (six) hours as needed for mild pain (or Fever >/= 101).    brinzolamide (AZOPT) 1 % ophthalmic suspension Place 1 drop into both eyes 2 (two) times daily.    cholestyramine (QUESTRAN) 4 g packet Take 1 packet (4 g total) by mouth 2 (two) times daily. Qty: 60 each, Refills: 0    donepezil (ARICEPT) 10 MG tablet Take 1 tablet (10 mg total) by mouth at bedtime. Qty: 30 tablet, Refills: 0    dorzolamide-timolol (COSOPT)  22.3-6.8 MG/ML ophthalmic solution Place 1 drop into the left eye 2 (two) times daily.    dutasteride (AVODART) 0.5 MG capsule Take 1 capsule (0.5 mg total) by mouth daily. Qty: 30 capsule, Refills: 0    feeding supplement, ENSURE ENLIVE, (ENSURE ENLIVE) LIQD Take 237 mLs by mouth 3 (three) times daily with meals. Qty: 237 mL, Refills: 12    latanoprost (XALATAN) 0.005 % ophthalmic solution Place 1 drop into both eyes at bedtime.    sertraline (ZOLOFT) 50 MG tablet Take 0.5 tablets (25 mg total) by mouth daily. Qty: 30 tablet, Refills: 3       STOP taking these medications     lisinopril (PRINIVIL,ZESTRIL) 20 MG tablet      oxyCODONE (OXY IR/ROXICODONE) 5 MG immediate release tablet              \Discharge Medications: Please see discharge summary for a list of discharge medications.  Relevant Imaging Results:  Relevant Lab Results:   Additional Information Home Health to follow at discharge  [facility], [place]

## 2015-12-01 NOTE — Discharge Summary (Addendum)
[place] [facility] [place] NAME: [name]    MR#:  [id number]  DATE OF BIRTH:  06-23-1923  DATE OF ADMISSION:  11/25/2015 ADMITTING PHYSICIAN: Nicholes Mango, MD  DATE OF DISCHARGE:  12/01/2015 PRIMARY CARE PHYSICIAN: [name], MD    ADMISSION DIAGNOSIS:  Renal insufficiency [N28.9]   DISCHARGE DIAGNOSIS:  Acute kidney injury-prerenal from severe dehydration  SECONDARY DIAGNOSIS:   Past Medical History  Diagnosis Date  . Arrhythmia     pvcs  . Hyperlipidemia   . Cancer ([place])     bladder  . BPH (benign prostatic hyperplasia)   . Hypertension   . PONV (postoperative nausea and vomiting)   . Glaucoma   . Fracture of right talus     diagonosed 12/22/13- last f/u note on chart dated 01/02/14.     HOSPITAL COURSE:   1. Acute kidney injury-prerenal from severe dehydration Renal function improved with IV hydration , but because of poor oral intake patient can have recurrent renal failure.  2. History of bladder cancer and abnormal urinalysis Urine culture negative no antibiotics. Had history of bladder cancer but did not want an evaluation by urology. Blood clot from penis ,may be due to his agitation and trauma related.  3. Chronic history of diarrhea-extensive workup was done during the previous admission. C. difficile was negative. Patient's diarrhea has resolved , continue Questran.  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 Family decided not to pursue further, outpatient follow-up with Dr. Burlene Arnt as needed  5. Dementia with possible depression patient was seen by Dr. Weber Cooks during previous admission and started on Aricept. continue the same. Used Ativan for agitation as needed 0.5 mg every 6 hours. Discontinued sitter.   6. Failure to  thrive adult with poor by mouth intake-encourage by mouth fluid intake. better intake, patient may be appropriate for hospice. . Added Megace  7.sinus bradycardia: mproved. On cardizem.  DISCHARGE CONDITIONS:   Stable, discharge to ALF with HHPT.  CONSULTS OBTAINED:  Treatment Team:  Nicholes Mango, MD Demetrios Loll, MD  DRUG ALLERGIES:  No Known Allergies  DISCHARGE MEDICATIONS:   Current Discharge Medication List    START taking these medications   Details  ALPRAZolam (XANAX) 0.25 MG tablet Take 1 tablet (0.25 mg total) by mouth 3 (three) times daily as needed for anxiety or sleep. Qty: 15 tablet, Refills: 0    diltiazem (CARDIZEM CD) 120 MG 24 hr capsule Take 1 capsule (120 mg total) by mouth daily. Qty: 30 capsule, Refills: 0      CONTINUE these medications which have NOT CHANGED   Details  acetaminophen (TYLENOL) 325 MG tablet Take 2 tablets (650 mg total) by mouth every 6 (six) hours as needed for mild pain (or Fever >/= 101).    brinzolamide (AZOPT) 1 % ophthalmic suspension Place 1 drop into both eyes 2 (two) times daily.    cholestyramine (QUESTRAN) 4 g packet Take 1 packet (4 g total) by mouth 2 (two) times daily. Qty: 60 each, Refills: 0    donepezil (ARICEPT) 10 MG tablet Take 1 tablet (10 mg total) by mouth at bedtime. Qty: 30 tablet, Refills: 0    dorzolamide-timolol (COSOPT) 22.3-6.8 MG/ML ophthalmic solution Place 1 drop into the left eye 2 (two) times daily.    dutasteride (AVODART) 0.5 MG  capsule Take 1 capsule (0.5 mg total) by mouth daily. Qty: 30 capsule, Refills: 0    feeding supplement, ENSURE ENLIVE, (ENSURE ENLIVE) LIQD Take 237 mLs by mouth 3 (three) times daily with meals. Qty: 237 mL, Refills: 12    latanoprost (XALATAN) 0.005 % ophthalmic solution Place 1 drop into both eyes at bedtime.    sertraline (ZOLOFT) 50 MG tablet Take 0.5 tablets (25 mg total) by mouth daily. Qty: 30 tablet, Refills: 3      STOP taking these medications      lisinopril (PRINIVIL,ZESTRIL) 20 MG tablet      oxyCODONE (OXY IR/ROXICODONE) 5 MG immediate release tablet          DISCHARGE INSTRUCTIONS:   If you experience worsening of your admission symptoms, develop shortness of breath, life threatening emergency, suicidal or homicidal thoughts you must seek medical attention immediately by calling 911 or calling your MD immediately  if symptoms less severe.  You Must read complete instructions/literature along with all the possible adverse reactions/side effects for all the Medicines you take and that have been prescribed to you. Take any new Medicines after you have completely understood and accept all the possible adverse reactions/side effects.   Please note  You were cared for by a hospitalist during your hospital stay. If you have any questions about your discharge medications or the care you received while you were in the hospital after you are discharged, you can call the unit and asked to speak with the hospitalist on call if the hospitalist that took care of you is not available. Once you are discharged, your primary care physician will handle any further medical issues. Please note that NO REFILLS for any discharge medications will be authorized once you are discharged, as it is imperative that you return to your primary care physician (or establish a relationship with a primary care physician if you do not have one) for your aftercare needs so that they can reassess your need for medications and monitor your lab values.    Today   SUBJECTIVE   Pt is awake but demented, has better oral intake, tolerated PT.   VITAL SIGNS:  Blood pressure 157/86, pulse 74, temperature 98.1 F (36.7 C), temperature source Oral, resp. rate 18, height 5\' 11"  (1.803 m), weight 76.658 kg (169 lb), SpO2 97 %.  I/O:   Intake/Output Summary (Last 24 hours) at 12/01/15 1107 Last data filed at 12/01/15 0933  Gross per 24 hour  Intake    970 ml  Output     210 ml  Net    760 ml    PHYSICAL EXAMINATION:  GENERAL:  82 y.o.-year-old patient lying in the bed with no acute distress.  EYES: Pupils equal, round, reactive to light and accommodation. No scleral icterus. Extraocular muscles intact.  HEENT: Head atraumatic, normocephalic. Oropharynx and nasopharynx clear.  NECK:  Supple, no jugular venous distention. No thyroid enlargement, no tenderness.  LUNGS: Normal breath sounds bilaterally, no wheezing, rales,rhonchi or crepitation. No use of accessory muscles of respiration.  CARDIOVASCULAR: S1, S2 normal. No murmurs, rubs, or gallops.  ABDOMEN: Soft, non-tender, non-distended. Bowel sounds present. No organomegaly or mass.  EXTREMITIES: No pedal edema, cyanosis, or clubbing.  NEUROLOGIC: demented, unable to exam.  PSYCHIATRIC: The patient is demented  SKIN: No obvious rash, lesion, or ulcer.   DATA REVIEW:   CBC  Recent Labs Lab 11/27/15 0536  WBC 3.8  HGB 12.2*  HCT 35.8*  PLT 162    Chemistries  Recent Labs Lab 11/26/15 0455  11/29/15 0534  NA 143  < > 138  K 4.8  < > 3.9  CL 115*  < > 112*  CO2 20*  < > 21*  GLUCOSE 119*  < > 109*  BUN 79*  < > 24*  CREATININE 2.52*  < > 1.26*  CALCIUM 8.3*  < > 8.2*  AST 30  --   --   ALT 34  --   --   ALKPHOS 57  --   --   BILITOT 1.0  --   --   < > = values in this interval not displayed.  Cardiac Enzymes No results for input(s): TROPONINI in the last 168 hours.  Microbiology Results  Results for orders placed or performed during the hospital encounter of 11/25/15  Urine culture     Status: None   Collection Time: 11/25/15 12:09 PM  Result Value Ref Range Status   Specimen Description URINE, CLEAN CATCH  Final   Special Requests NONE  Final   Culture NO GROWTH 2 DAYS  Final   Report Status 11/27/2015 FINAL  Final    RADIOLOGY:  No results found.      Management plans discussed with the patient's daughers and they are in agreement. All questions were  answered. I discussed with Dr. Megan Salon. CODE STATUS: Changed to DNR per Dr. Megan Salon.    Code Status Orders     DNR from 12/01/2015   Start     Ordered   11/25/15 1808  Full code    11/25/15 1808    Code Status History    Date Active Date Inactive Code Status Order ID Comments User Context   11/15/2015  9:02 PM 11/22/2015  8:44 PM Full Code [id number]  Fritzi Mandes, MD Inpatient      TOTAL TIME TAKING CARE OF THIS PATIENT: 52 minutes.    Demetrios Loll M.D on 12/01/2015 at 11:07 AM  Between 7am to 6pm - Pager - [phone]  After 6pm go to [email or web address] - password [email or web address] [facility]  [place] [facility]  Office  [phone]  CC: Primary care physician; [name], MD

## 2015-12-01 NOTE — Clinical Social Work Note (Signed)
Patient is to discharge today to return to [facility]. Linda at [facility] is aware and in agreement. Vaughan Basta had discharge information sent to her this morning. Patient's stepdaughter, Opal Sidles, is also aware as is Jane's sister. Opal Sidles stated she does not want to transport and is aware that EMS will need to transport since [facility] does not transport. Shela Leff MSW,LCSW [phone]

## 2015-12-01 NOTE — Consult Note (Signed)
Palliative Medicine Inpatient Consult Note   Name: [name] Date: 12/01/2015 MRN: [id number]  DOB: 1923-08-03  Referring Physician: Demetrios Loll, MD  Palliative Care consult requested for this 82 y.o. male for goals of medical therapy in patient with moderately advanced to advanced dementia who came in with acute renal failure due to poor oral intake. He has also had diarrhea which is better --but he must wear Depends because he continues with some bowel incontinence.  He has a number of medical conditions that are not being pursued aggressively -due to his advanced age and dementia.  He is today going to [facility]  I was asked to talk with daughter about code status and goals of care.    ----------------------------------------  TODAY'S DISCUSSIONS AND DECISIONS:  I called patient's daughter, [name], by phone.  Pt is going to an Assisted Living [facility]).  He did not qualify for a rehab stay in a skilled facility because he walked around the nurses station twice and did not appear to need therapy other than that which can be provided by a Home Health agency at the assisted lviing facility where pt will be going.   We talked about code status.  She had to be educated about this as she did not recall being asked about this by anyone so far.  After I explained this, she opted for her father to be 'Do Not Resuscitate status'  --due to the data for very elderly folks being so poor with regard to the outcome of resuscitative efforts.    She also agrees to a Palliative Care consult, because he might soon qualify for Hospice care in some setting .  He should have a Palliative Care Consult in the facility.  I have asked Social Worker to touch base with pt's daughter again about Discharge Plans. I have asked Dr Geryl Councilman to refresh DC summary to reflect the new orders for DNR status and a Palliative Care consult IN THE FACILITY.  I have completed portable DNR form to go with pt.     -----------------------------------------   IMPRESSION: Acute Renal Failure ----with ATN --due to severe dehydration due to poor oral intake Alzheimer's Dementia  ----Advanced--started on Aricept by Psychiartrist, Dr. Weber Cooks ----has some agitation at times associated with his dementia (requred a sitter for a while here) ----with history of wandering, poor judgement, and poor recall ----signs of dementia began several years ago--has had help with all business affairs and bills ----DMV revoked his drivers license some time ago due to his dementia ----daughter provided support and checked on him frequently ----incontinence is an issue -he wears briefs now (goes with advanced dementia) ----oral intake is decreasing which also accompanies advancing dementia ----falling is now an issue as well (stumbles with small injuries) 2 recent falls ----unable to manage his affairs and unable to live alone --must have 24/7 supervision Malnutrition --severe --present at admission ---due to loss of appetite from advanced dementia and possibly some mild depression ----now eating a little more Arrythmias (PVCs, tachycardia,  and bradycardia at times).---more stable recently History of Bladder CA  ----has been treated.  ----no follow up requested  --but he does have blood in urine (occasionally has clots) BPH Dyslipidemia Benign Prostatic Hypertrophy Glaucoma Diarrhea--deemed to be chronic but of unclear etiology (full work up last admission) ---C diff was negative ---Questran helps Right Thigh MASS ---no work up desired.  This could be a benign lipoma or a worrisome liposarcoma per MRI report.  He  was seen during previous admission and work up is not desired.  Pt can follow up with Dr. Rogue Bussing, oncologist, IF a work up is desired later on.  Hypertension UTIs Falls   ------------------------------------------------      REVIEW OF SYSTEMS:  Patient is not able to provide ROS reliably due  to dementia  SPIRITUAL SUPPORT SYSTEM: Yes.  SOCIAL HISTORY:  reports that he has never smoked. He has never used smokeless tobacco. He reports that he does not drink alcohol or use illicit drugs.  LEGAL DOCUMENTS:  I completed a portable DNR form to go with pt.  CODE STATUS: DNR --as of now.    PAST MEDICAL HISTORY: Past Medical History  Diagnosis Date  . Arrhythmia     pvcs  . Hyperlipidemia   . Cancer ([place])     bladder  . BPH (benign prostatic hyperplasia)   . Hypertension   . PONV (postoperative nausea and vomiting)   . Glaucoma   . Fracture of right talus     diagonosed 12/22/13- last f/u note on chart dated 01/02/14.     PAST SURGICAL HISTORY:  Past Surgical History  Procedure Laterality Date  . Bladder surgery  4 or 5 years ago    "cut bladder cancer out"  . Tonsillectomy and adenoidectomy  as child  . Cystoscopy/retrograde/ureteroscopy Bilateral 11/17/2013    Procedure: CYSTOSCOPY, bilateral /RETROGRADE/ right URETEROSCOPY, right stent, bladder fulgeration;  Surgeon: Bernestine Amass, MD;  Location: [facility];  Service: Urology;  Laterality: Bilateral;  . Cystoscopy with retrograde pyelogram, ureteroscopy and stent placement Bilateral 01/12/2014    Procedure: CYSTOSCOPY WITH BILATERAL RETROGRADE PYELOGRAM/URETERAL STENT REMOVAL, URETEROSCOPY, LEFT  AND  BLADDER BIOPSY;  Surgeon: Bernestine Amass, MD;  Location: [facility];  Service: Urology;  Laterality: Bilateral;    ALLERGIES:  has No Known Allergies.  MEDICATIONS:  Current Facility-Administered Medications  Medication Dose Route Frequency Provider Last Rate Last Dose  . acetaminophen (TYLENOL) suppository 650 mg  650 mg Rectal Q6H PRN Nicholes Mango, MD      . acetaminophen (TYLENOL) tablet 650 mg  650 mg Oral Q6H PRN Nicholes Mango, MD   650 mg at 11/30/15 1812  . brinzolamide (AZOPT) 1 % ophthalmic suspension 1 drop  1 drop Both Eyes BID Nicholes Mango, MD   1 drop at 11/28/15 2155  . cholestyramine (QUESTRAN) packet 4 g  4 g Oral  BID Nicholes Mango, MD   4 g at 12/01/15 1042  . dextrose 5 %-0.45 % sodium chloride infusion   Intravenous Continuous Epifanio Lesches, MD 50 mL/hr at 12/01/15 0258    . diltiazem (CARDIZEM CD) 24 hr capsule 120 mg  120 mg Oral Daily Demetrios Loll, MD   120 mg at 12/01/15 1201  . diltiazem (CARDIZEM) injection 20 mg  20 mg Intravenous PRN Epifanio Lesches, MD   20 mg at 11/29/15 1620  . donepezil (ARICEPT) tablet 10 mg  10 mg Oral QHS Nicholes Mango, MD   10 mg at 11/30/15 2214  . dorzolamide-timolol (COSOPT) 22.3-6.8 MG/ML ophthalmic solution 1 drop  1 drop Left Eye BID Nicholes Mango, MD   1 drop at 11/28/15 2155  . dutasteride (AVODART) capsule 0.5 mg  0.5 mg Oral Daily Nicholes Mango, MD   0.5 mg at 12/01/15 1041  . enoxaparin (LOVENOX) injection 40 mg  40 mg Subcutaneous Q24H Epifanio Lesches, MD   40 mg at 11/29/15 2232  . feeding supplement (ENSURE ENLIVE) (ENSURE ENLIVE) liquid 237 mL  237 mL Oral TID  WC Aruna Gouru, MD   237 mL at 11/29/15 1333  . haloperidol lactate (HALDOL) injection 1 mg  1 mg Intravenous Q6H PRN Vaughan Basta, MD   1 mg at 11/30/15 0000  . hydrALAZINE (APRESOLINE) injection 10 mg  10 mg Intravenous Q4H PRN Pavan Pyreddy, MD      . latanoprost (XALATAN) 0.005 % ophthalmic solution 1 drop  1 drop Both Eyes QHS Nicholes Mango, MD   1 drop at 11/28/15 2202  . LORazepam (ATIVAN) injection 0.5 mg  0.5 mg Intravenous Q6H PRN Saundra Shelling, MD   0.5 mg at 11/30/15 1409  . megestrol (MEGACE) 400 MG/10ML suspension 400 mg  400 mg Oral BID Dustin Flock, MD   400 mg at 12/01/15 1042  . ondansetron (ZOFRAN) tablet 4 mg  4 mg Oral Q6H PRN Nicholes Mango, MD       Or  . ondansetron (ZOFRAN) injection 4 mg  4 mg Intravenous Q6H PRN Nicholes Mango, MD      . sertraline (ZOLOFT) tablet 25 mg  25 mg Oral Daily Nicholes Mango, MD   25 mg at 12/01/15 1041  . sodium chloride flush (NS) 0.9 % injection 3 mL  3 mL Intravenous Q12H Nicholes Mango, MD   3 mL at 11/29/15 1107    Vital Signs: BP 141/82  mmHg  Pulse 74  Temp(Src) 98.1 F (36.7 C) (Tympanic)  Resp 18  Ht 5\' 11"  (1.803 m)  Wt 76.658 kg (169 lb)  BMI 23.58 kg/m2  SpO2 96% Filed Weights   11/25/15 0926  Weight: 76.658 kg (169 lb)    Estimated body mass index is 23.58 kg/(m^2) as calculated from the following:   Height as of this encounter: 5\' 11"  (1.803 m).   Weight as of this encounter: 76.658 kg (169 lb).  PERFORMANCE STATUS (ECOG) : 2 - Symptomatic, <50% confined to bed  PHYSICAL EXAM: Up in bathroom with nurse assisting NAD Muscle Wasting noted EOMI No JVD Skin without mottling or cyanosis Alert and talking but confused.   LABS: CBC:    Component Value Date/Time   WBC 3.8 11/27/2015 0536   WBC 5.4 06/29/2009 0948   HGB 12.2* 11/27/2015 0536   HGB 15.1 06/29/2009 0948   HCT 35.8* 11/27/2015 0536   HCT 42.4 06/29/2009 0948   PLT 162 11/27/2015 0536   PLT 152 06/29/2009 0948   MCV 93.5 11/27/2015 0536   MCV 97.2 06/29/2009 0948   NEUTROABS 6.2 11/25/2015 0942   NEUTROABS 3.6 06/29/2009 0948   LYMPHSABS 1.1 11/25/2015 0942   LYMPHSABS 1.0 06/29/2009 0948   MONOABS 0.8 11/25/2015 0942   MONOABS 0.5 06/29/2009 0948   EOSABS 0.1 11/25/2015 0942   EOSABS 0.2 06/29/2009 0948   BASOSABS 0.0 11/25/2015 0942   BASOSABS 0.0 06/29/2009 0948   Comprehensive Metabolic Panel:    Component Value Date/Time   NA 138 11/29/2015 0534   K 3.9 11/29/2015 0534   CL 112* 11/29/2015 0534   CO2 21* 11/29/2015 0534   BUN 24* 11/29/2015 0534   CREATININE 1.26* 11/29/2015 0534   CREATININE 4.51* 11/24/2015 1653   GLUCOSE 109* 11/29/2015 0534   CALCIUM 8.2* 11/29/2015 0534   AST 30 11/26/2015 0455   ALT 34 11/26/2015 0455   ALKPHOS 57 11/26/2015 0455   BILITOT 1.0 11/26/2015 0455   PROT 6.4* 11/26/2015 0455   ALBUMIN 3.5 11/26/2015 0455      More than 50% of the visit was spent in counseling/coordination of care: Yes  Time Spent: 80  minutes

## 2015-12-01 NOTE — Care Management Important Message (Signed)
Important Message  Patient Details  Name: [name] MRN: [id number] Date of Birth: 1923/01/02   Medicare Important Message Given:  Yes    Juliann Pulse A Neetu Carrozza 12/01/2015, 11:37 AM

## 2015-12-01 NOTE — Care Management Note (Signed)
Case Management Note  Patient Details  Name: [name] MRN: [id number] Date of Birth: [birth year]  Subjective/Objective:             Patient to be discharged today back to [facility].  MD has ordered home health PT and nursing.  Spoke with patient's daughter Opal Sidles who stated she did not have a preference on home health agency.  Referral was made [facility].  Cheryl with [facility] notified of pending discharge.  Patient will be transported by EMS.  RNCM signing off       Action/Plan:   Expected Discharge Date:                  Expected Discharge Plan:     In-House Referral:     Discharge planning Services     Post Acute Care Choice:    Choice offered to:     DME Arranged:    DME Agency:     HH Arranged:    [place] Agency:     Status of Service:     Medicare Important Message Given:  Yes Date Medicare IM Given:    Medicare IM give by:    Date Additional Medicare IM Given:    Additional Medicare Important Message give by:     If discussed at [place] of Stay Meetings, dates discussed:    Additional Comments:  Beverly Sessions, RN 12/01/2015, 2:31 PM

## 2015-12-01 NOTE — Progress Notes (Signed)
12/01/2015 3:11 PM  BP 141/82 mmHg  Pulse 74  Temp(Src) 98.1 F (36.7 C) (Tympanic)  Resp 18  Ht 5\' 11"  (1.803 m)  Wt 76.658 kg (169 lb)  BMI 23.58 kg/m2  SpO2 96% Patient discharged per MD orders. Report called to [facility], nursing facility. IV removed per policy. Appropriate discharge paperwork in packet to be sent with patient. Belongings, including hearing aid, packed up to be sent with patient. Awaiting EMS to arrive for discharge.  Almedia Balls, RN

## 2015-12-02 ENCOUNTER — Telehealth: Payer: Self-pay

## 2015-12-02 NOTE — Telephone Encounter (Signed)
Unable to reach patient.  Will attempt to call tomorrow and follow up with transitional care management.

## 2015-12-03 ENCOUNTER — Telehealth: Payer: Self-pay

## 2015-12-03 NOTE — Telephone Encounter (Signed)
Unable to reach patient.  Second attempt for transitional care management.  Appointment previously scheduled via hospital discharge.  Will continue to follow as appropriate.

## 2015-12-07 ENCOUNTER — Ambulatory Visit: Payer: Medicare Other | Admitting: Family Medicine

## 2015-12-12 NOTE — ED Notes (Signed)
EKG dictation ED ECG REPORT I, Daymon Larsen, the attending physician, personally viewed and interpreted this ECG.  Date:  11/25/2015 EKG Time: 0 941 Rate: 62 Rhythm: normal sinus rhythm QRS Axis: normal Intervals: Right bundle-branch block pattern ST/T Wave abnormalities: normal Conduction Disturbances: none Narrative Interpretation: unremarkable No acute ischemic changes  Daymon Larsen, MD 12/12/15 [phone]

## 2015-12-31 ENCOUNTER — Ambulatory Visit: Payer: Medicare Other | Admitting: Family Medicine

## 2016-01-07 ENCOUNTER — Inpatient Hospital Stay: Payer: Medicare Other

## 2016-01-07 ENCOUNTER — Encounter: Payer: Self-pay | Admitting: Emergency Medicine

## 2016-01-07 ENCOUNTER — Emergency Department: Payer: Medicare Other

## 2016-01-07 ENCOUNTER — Inpatient Hospital Stay
Admission: EM | Admit: 2016-01-07 | Discharge: 2016-01-12 | DRG: 064 | Disposition: A | Discharge status: Hospice | Payer: Medicare Other | Attending: Internal Medicine | Admitting: Internal Medicine

## 2016-01-07 DIAGNOSIS — Z66 Do not resuscitate: Secondary | ICD-10-CM | POA: Diagnosis present

## 2016-01-07 DIAGNOSIS — I63511 Cerebral infarction due to unspecified occlusion or stenosis of right middle cerebral artery: Secondary | ICD-10-CM | POA: Diagnosis present

## 2016-01-07 DIAGNOSIS — Z79899 Other long term (current) drug therapy: Secondary | ICD-10-CM

## 2016-01-07 DIAGNOSIS — E43 Unspecified severe protein-calorie malnutrition: Secondary | ICD-10-CM | POA: Diagnosis present

## 2016-01-07 DIAGNOSIS — I10 Essential (primary) hypertension: Secondary | ICD-10-CM | POA: Diagnosis present

## 2016-01-07 DIAGNOSIS — Z8249 Family history of ischemic heart disease and other diseases of the circulatory system: Secondary | ICD-10-CM

## 2016-01-07 DIAGNOSIS — F0280 Dementia in other diseases classified elsewhere without behavioral disturbance: Secondary | ICD-10-CM | POA: Diagnosis present

## 2016-01-07 DIAGNOSIS — I639 Cerebral infarction, unspecified: Secondary | ICD-10-CM | POA: Diagnosis present

## 2016-01-07 DIAGNOSIS — R29810 Facial weakness: Secondary | ICD-10-CM

## 2016-01-07 DIAGNOSIS — Z8551 Personal history of malignant neoplasm of bladder: Secondary | ICD-10-CM

## 2016-01-07 DIAGNOSIS — R4182 Altered mental status, unspecified: Secondary | ICD-10-CM | POA: Diagnosis present

## 2016-01-07 DIAGNOSIS — D72829 Elevated white blood cell count, unspecified: Secondary | ICD-10-CM | POA: Diagnosis present

## 2016-01-07 DIAGNOSIS — G8194 Hemiplegia, unspecified affecting left nondominant side: Secondary | ICD-10-CM | POA: Diagnosis present

## 2016-01-07 DIAGNOSIS — Z823 Family history of stroke: Secondary | ICD-10-CM

## 2016-01-07 DIAGNOSIS — N179 Acute kidney failure, unspecified: Secondary | ICD-10-CM | POA: Diagnosis present

## 2016-01-07 DIAGNOSIS — G309 Alzheimer's disease, unspecified: Secondary | ICD-10-CM | POA: Diagnosis present

## 2016-01-07 DIAGNOSIS — Z681 Body mass index (BMI) 19 or less, adult: Secondary | ICD-10-CM | POA: Diagnosis not present

## 2016-01-07 DIAGNOSIS — Z803 Family history of malignant neoplasm of breast: Secondary | ICD-10-CM

## 2016-01-07 DIAGNOSIS — H409 Unspecified glaucoma: Secondary | ICD-10-CM | POA: Diagnosis present

## 2016-01-07 DIAGNOSIS — R000 Tachycardia, unspecified: Secondary | ICD-10-CM | POA: Diagnosis not present

## 2016-01-07 DIAGNOSIS — R531 Weakness: Secondary | ICD-10-CM

## 2016-01-07 DIAGNOSIS — R479 Unspecified speech disturbances: Secondary | ICD-10-CM

## 2016-01-07 DIAGNOSIS — R1310 Dysphagia, unspecified: Secondary | ICD-10-CM | POA: Diagnosis present

## 2016-01-07 DIAGNOSIS — R630 Anorexia: Secondary | ICD-10-CM | POA: Diagnosis present

## 2016-01-07 DIAGNOSIS — F329 Major depressive disorder, single episode, unspecified: Secondary | ICD-10-CM | POA: Diagnosis present

## 2016-01-07 DIAGNOSIS — N400 Enlarged prostate without lower urinary tract symptoms: Secondary | ICD-10-CM | POA: Diagnosis present

## 2016-01-07 DIAGNOSIS — Z515 Encounter for palliative care: Secondary | ICD-10-CM | POA: Diagnosis present

## 2016-01-07 DIAGNOSIS — R1319 Other dysphagia: Secondary | ICD-10-CM | POA: Diagnosis present

## 2016-01-07 DIAGNOSIS — E785 Hyperlipidemia, unspecified: Secondary | ICD-10-CM | POA: Diagnosis present

## 2016-01-07 LAB — CBC WITH DIFFERENTIAL/PLATELET
BASOS ABS: 0 10*3/uL (ref 0–0.1)
Basophils Relative: 0 %
EOS ABS: 0 10*3/uL (ref 0–0.7)
EOS PCT: 1 %
HCT: 37.2 % — ABNORMAL LOW (ref 40.0–52.0)
HEMOGLOBIN: 12.8 g/dL — AB (ref 13.0–18.0)
LYMPHS ABS: 0.9 10*3/uL — AB (ref 1.0–3.6)
Lymphocytes Relative: 12 %
MCH: 31.5 pg (ref 26.0–34.0)
MCHC: 34.3 g/dL (ref 32.0–36.0)
MCV: 91.6 fL (ref 80.0–100.0)
MONOS PCT: 7 %
Monocytes Absolute: 0.5 10*3/uL (ref 0.2–1.0)
NEUTROS PCT: 80 %
Neutro Abs: 5.9 10*3/uL (ref 1.4–6.5)
PLATELETS: 191 10*3/uL (ref 150–440)
RBC: 4.06 MIL/uL — AB (ref 4.40–5.90)
RDW: 14.9 % — ABNORMAL HIGH (ref 11.5–14.5)
WBC: 7.4 10*3/uL (ref 3.8–10.6)

## 2016-01-07 LAB — URINALYSIS COMPLETE WITH MICROSCOPIC (ARMC ONLY)
Bilirubin Urine: NEGATIVE
Glucose, UA: NEGATIVE mg/dL
Ketones, ur: NEGATIVE mg/dL
Leukocytes, UA: NEGATIVE
Nitrite: NEGATIVE
PH: 5 (ref 5.0–8.0)
Protein, ur: 30 mg/dL — AB
Specific Gravity, Urine: 1.008 (ref 1.005–1.030)

## 2016-01-07 LAB — TROPONIN I: Troponin I: <0.03 ng/mL (ref <0.031)

## 2016-01-07 LAB — COMPREHENSIVE METABOLIC PANEL
ALT: 13 U/L — AB (ref 17–63)
AST: 19 U/L (ref 15–41)
Albumin: 3.5 g/dL (ref 3.5–5.0)
Alkaline Phosphatase: 51 U/L (ref 38–126)
Anion gap: 6 (ref 5–15)
BUN: 31 mg/dL — AB (ref 6–20)
CHLORIDE: 109 mmol/L (ref 101–111)
CO2: 21 mmol/L — AB (ref 22–32)
CREATININE: 1.42 mg/dL — AB (ref 0.61–1.24)
Calcium: 8.4 mg/dL — ABNORMAL LOW (ref 8.9–10.3)
GFR calc Af Amer: 49 mL/min — ABNORMAL LOW (ref >60)
GFR calc non Af Amer: 42 mL/min — ABNORMAL LOW (ref >60)
Glucose, Bld: 123 mg/dL — ABNORMAL HIGH (ref 65–99)
Potassium: 3.7 mmol/L (ref 3.5–5.1)
SODIUM: 136 mmol/L (ref 135–145)
Total Bilirubin: 1.1 mg/dL (ref 0.3–1.2)
Total Protein: 6.4 g/dL — ABNORMAL LOW (ref 6.5–8.1)

## 2016-01-07 LAB — LIPID PANEL
CHOL/HDL RATIO: 5.6 ratio
Cholesterol: 184 mg/dL (ref 0–200)
HDL: 33 mg/dL — AB (ref >40)
LDL CALC: 132 mg/dL — AB (ref 0–99)
TRIGLYCERIDES: 97 mg/dL (ref <150)
VLDL: 19 mg/dL (ref 0–40)

## 2016-01-07 LAB — MRSA PCR SCREENING: MRSA BY PCR: NEGATIVE

## 2016-01-07 MED ORDER — DORZOLAMIDE HCL-TIMOLOL MAL 2-0.5 % OP SOLN
1.0000 [drp] | Freq: Two times a day (BID) | OPHTHALMIC | Status: DC
  Administered 2016-01-08 – 2016-01-11 (×6): 1 [drp] via OPHTHALMIC
  Filled 2016-01-07: qty 10

## 2016-01-07 MED ORDER — ACETAMINOPHEN 650 MG RE SUPP
650.0000 mg | Freq: Four times a day (QID) | RECTAL | Status: DC | PRN
Start: 2016-01-07 — End: 2016-01-12

## 2016-01-07 MED ORDER — SODIUM CHLORIDE 0.9 % IV SOLN
INTRAVENOUS | Status: DC
  Administered 2016-01-07 – 2016-01-08 (×2): via INTRAVENOUS

## 2016-01-07 MED ORDER — ASPIRIN 81 MG PO CHEW
324.0000 mg | CHEWABLE_TABLET | Freq: Once | ORAL | Status: DC
  Filled 2016-01-07: qty 4

## 2016-01-07 MED ORDER — ONDANSETRON HCL 4 MG/2ML IJ SOLN: 4.0000 mg | Freq: Four times a day (QID) | INTRAMUSCULAR | Status: DC | PRN

## 2016-01-07 MED ORDER — ACETAMINOPHEN 325 MG PO TABS: 650.0000 mg | ORAL_TABLET | Freq: Four times a day (QID) | ORAL | Status: DC | PRN

## 2016-01-07 MED ORDER — DONEPEZIL HCL 5 MG PO TABS
10.0000 mg | ORAL_TABLET | Freq: Every day | ORAL | Status: DC
  Administered 2016-01-08 – 2016-01-10 (×3): 10 mg via ORAL
  Filled 2016-01-07 (×4): qty 2

## 2016-01-07 MED ORDER — ASPIRIN 300 MG RE SUPP
300.0000 mg | Freq: Once | RECTAL | Status: AC
  Administered 2016-01-07: 300 mg via RECTAL
  Filled 2016-01-07: qty 1

## 2016-01-07 MED ORDER — ASPIRIN 300 MG RE SUPP
300.0000 mg | Freq: Every day | RECTAL | Status: DC
  Administered 2016-01-08: 11:00:00 300 mg via RECTAL
  Filled 2016-01-07: qty 1

## 2016-01-07 MED ORDER — ONDANSETRON HCL 4 MG PO TABS: 4.0000 mg | ORAL_TABLET | Freq: Four times a day (QID) | ORAL | Status: DC | PRN

## 2016-01-07 MED ORDER — SODIUM CHLORIDE 0.9% FLUSH
3.0000 mL | Freq: Two times a day (BID) | INTRAVENOUS | Status: DC
  Administered 2016-01-07 – 2016-01-12 (×10): 3 mL via INTRAVENOUS

## 2016-01-07 MED ORDER — BRINZOLAMIDE 1 % OP SUSP
1.0000 [drp] | Freq: Two times a day (BID) | OPHTHALMIC | Status: DC
  Administered 2016-01-08 – 2016-01-11 (×6): 1 [drp] via OPHTHALMIC
  Filled 2016-01-07: qty 10

## 2016-01-07 MED ORDER — CHOLESTYRAMINE 4 G PO PACK
4.0000 g | PACK | Freq: Two times a day (BID) | ORAL | Status: DC
  Administered 2016-01-08 – 2016-01-11 (×4): 4 g via ORAL
  Filled 2016-01-07 (×9): qty 1

## 2016-01-07 MED ORDER — SERTRALINE HCL 25 MG PO TABS
25.0000 mg | ORAL_TABLET | Freq: Every day | ORAL | Status: DC
  Administered 2016-01-08 – 2016-01-11 (×2): 25 mg via ORAL
  Filled 2016-01-07: qty 1
  Filled 2016-01-07 (×2): qty 0.5
  Filled 2016-01-07 (×2): qty 1

## 2016-01-07 MED ORDER — LATANOPROST 0.005 % OP SOLN
1.0000 [drp] | Freq: Every day | OPHTHALMIC | Status: DC
  Administered 2016-01-09 – 2016-01-10 (×2): 1 [drp] via OPHTHALMIC
  Filled 2016-01-07: qty 2.5

## 2016-01-07 MED ORDER — ENOXAPARIN SODIUM 40 MG/0.4ML ~~LOC~~ SOLN
40.0000 mg | SUBCUTANEOUS | Status: DC
  Administered 2016-01-07 – 2016-01-10 (×3): 40 mg via SUBCUTANEOUS
  Filled 2016-01-07 (×3): qty 0.4

## 2016-01-07 MED ORDER — ACETAMINOPHEN 325 MG PO TABS
650.0000 mg | ORAL_TABLET | Freq: Four times a day (QID) | ORAL | Status: DC | PRN
  Administered 2016-01-09: 650 mg via ORAL
  Filled 2016-01-07: qty 2

## 2016-01-07 NOTE — ED Notes (Signed)
Pt from [facility]. Pt seen last normal at 1500 yesterday; stays on dementia unit. This AM noted to have left facial droop and left weakness.

## 2016-01-07 NOTE — Evaluation (Signed)
Physical Therapy Evaluation Patient Details Name: [name] MRN: [id number] DOB: Nov 23, 1922 Today's Date: 01/07/2016   History of Present Illness  Pt is a 82 y/o male here with L facial droop and L sided weakness.  He has baseline dementia and is very HOH, but is very different from his baseline.    Clinical Impression  Pt with significant baseline dementia and is very hard of hearing making eval difficult.  He is very determined with ambulation and mobility once he is able to understand the objective, but is unsafe and does not appear to understand his new onset weakness, coordination and mobility issues.  He is able to take a few steps, but quickly becomes unsteady and unsafe.  Pt's family very involved and concerned about his change from his normal ability to ambulate w/o AD.  Pt is not so much impulsive as confused and unable to follow instructions from PT consistently.      Follow Up Recommendations SNF    Equipment Recommendations  Rolling walker with 5" wheels (per progress)    Recommendations for Other Services       Precautions / Restrictions Precautions Precautions: Fall Restrictions Weight Bearing Restrictions: No      Mobility  Bed Mobility Overal bed mobility: Needs Assistance Bed Mobility: Supine to Sit;Sit to Supine     Supine to sit: Min assist;Mod assist Sit to supine: Mod assist   General bed mobility comments: Pt shows very good effort once he understood what was asked of him, generally is weak and not at all at his baseline  Transfers Overall transfer level: Needs assistance Equipment used: Rolling walker (2 wheeled) Transfers: Sit to/from Stand Sit to Stand: Min assist;Mod assist         General transfer comment: Pt shows great determination to get up, has decreased use of L UE on walker, but is able to maintain standing balance with great effort and heavy cuing  Ambulation/Gait Ambulation/Gait assistance: Max assist;Mod assist Ambulation  Distance (Feet): 8 Feet Assistive device: Rolling walker (2 wheeled)       General Gait Details: Pt is normally able to walk w/o AD as much as he needs.  Given his dementia it appears he was more confident than he should have been.  He is able to advance L LE a few steps fwd, he did not go backward when PT cued him to do so and he seemed determined to turn and try walking farther.  Pt started to become very fatigued, have difficulty using L arm on the walker and advancing L LE and ultimately became very unsafe needing max assist just to get back to bed a few feet.  Pt's HOH and dementia make following safety cues difficult.  Family was helpful in assisting during the effort.   Stairs            Wheelchair Mobility    Modified Rankin (Stroke Patients Only)       Balance Overall balance assessment: Needs assistance Sitting-balance support: Single extremity supported Sitting balance-Leahy Scale: Fair     Standing balance support: Bilateral upper extremity supported Standing balance-Leahy Scale: Poor                               Pertinent Vitals/Pain Pain Assessment: No/denies pain    Home Living Family/patient expects to be discharged to:: Assisted living               Home  Equipment: None Additional Comments: Pt on the memory care unit at [facility]    Prior Function Level of Independence: Independent         Comments: Pt generally has been able to ambulate at much as he needs and though he has some dementia issues can use the bathroom, go out with family, etd     Hand Dominance        Extremity/Trunk Assessment   Upper Extremity Assessment: Difficult to assess due to impaired cognition (able to do some voluntary motions, decreased quality on L)           Lower Extremity Assessment: Difficult to assess due to impaired cognition (able to do some voluntary motions, decreased quality on L)         Communication   Communication:  Expressive difficulties;Receptive difficulties;HOH  Cognition Arousal/Alertness:  (awake, not alert to surroundings, etc. can state name only) Behavior During Therapy: Flat affect;Impulsive Overall Cognitive Status: Impaired/Different from baseline                      General Comments      Exercises        Assessment/Plan    PT Assessment Patient needs continued PT services  PT Diagnosis Difficulty walking;Generalized weakness;Hemiplegia non-dominant side   PT Problem List Decreased strength;Decreased activity tolerance;Decreased range of motion;Decreased balance;Decreased mobility;Decreased coordination;Decreased cognition;Decreased knowledge of use of DME;Decreased safety awareness  PT Treatment Interventions DME instruction;Gait training;Functional mobility training;Therapeutic activities;Therapeutic exercise;Balance training;Neuromuscular re-education;Cognitive remediation;Patient/family education   PT Goals (Current goals can be found in the Care Plan section) Acute Rehab PT Goals Patient Stated Goal: pt unable to state PT Goal Formulation: With family Time For Goal Achievement: 01/21/16 Potential to Achieve Goals: Fair    Frequency 7X/week   Barriers to discharge        Co-evaluation               End of Session Equipment Utilized During Treatment: Gait belt Activity Tolerance: Patient limited by fatigue (mental status) Patient left: with bed alarm set;with call bell/phone within reach;with family/visitor present           Time: [id number] PT Time Calculation (min) (ACUTE ONLY): 33 min   Charges:   PT Evaluation $PT Eval Moderate Complexity: 1 Procedure     PT G Codes:       Wayne Both, PT, DPT [phone]  Kreg Shropshire 01/07/2016, 5:27 PM

## 2016-01-07 NOTE — H&P (Signed)
[place] [facility] [place] NAME: [name]    MR#:  [id number]  DATE OF BIRTH:  1923-05-29  DATE OF ADMISSION:  01/07/2016  PRIMARY CARE PHYSICIAN: [name], MD   REQUESTING/REFERRING PHYSICIAN: Dr Jimmye Norman  CHIEF COMPLAINT:  stroke  HISTORY OF PRESENT ILLNESS:  [name]  is a 82 y.o. male with a known history of Dementia and BPH who presents from [facility] with facial droop and left-sided weakness. Patient continues to have impairment in his speech and facial droop. He did not pass his swallow evaluation here. Symptoms were noticed by the nurse at [facility] this morning. He was last seen without these changes yesterday afternoon. Due to this he is not candidate for TPA.  PAST MEDICAL HISTORY:   Past Medical History  Diagnosis Date  . Arrhythmia     pvcs  . Hyperlipidemia   . Cancer ([place])     bladder  . BPH (benign prostatic hyperplasia)   . Hypertension   . PONV (postoperative nausea and vomiting)   . Glaucoma   . Fracture of right talus     diagonosed 12/22/13- last f/u note on chart dated 01/02/14.     PAST SURGICAL HISTORY:   Past Surgical History  Procedure Laterality Date  . Bladder surgery  4 or 5 years ago    "cut bladder cancer out"  . Tonsillectomy and adenoidectomy  as child  . Cystoscopy/retrograde/ureteroscopy Bilateral 11/17/2013    Procedure: CYSTOSCOPY, bilateral /RETROGRADE/ right URETEROSCOPY, right stent, bladder fulgeration;  Surgeon: Bernestine Amass, MD;  Location: [facility];  Service: Urology;  Laterality: Bilateral;  . Cystoscopy with retrograde pyelogram, ureteroscopy and stent placement Bilateral 01/12/2014    Procedure: CYSTOSCOPY WITH BILATERAL RETROGRADE PYELOGRAM/URETERAL STENT REMOVAL, URETEROSCOPY, LEFT  AND  BLADDER BIOPSY;  Surgeon: Bernestine Amass, MD;  Location: [facility];  Service: Urology;  Laterality: Bilateral;    SOCIAL HISTORY:   Social History  Substance Use Topics  . Smoking  status: Never Smoker   . Smokeless tobacco: Never Used  . Alcohol Use: No     Comment: rare    FAMILY HISTORY:   Family History  Problem Relation Age of Onset  . Heart attack Father 61    deceased from mi  . Hypertension Father   . Stroke Father   . Breast cancer Mother 77    breast cancer  . Cancer Mother     DRUG ALLERGIES:  No Known Allergies   REVIEW OF SYSTEMS:  CONSTITUTIONAL: No fever, fatigue Positive for generalized weakness.  EYES: No blurred or double vision.  EARS, NOSE, AND THROAT: No tinnitus or ear pain.  RESPIRATORY: No cough, shortness of breath, wheezing or hemoptysis.  CARDIOVASCULAR: No chest pain, orthopnea, edema.  GASTROINTESTINAL: No nausea, vomiting, diarrhea or abdominal pain.  GENITOURINARY: No dysuria, hematuria.  ENDOCRINE: No polyuria, nocturia,  HEMATOLOGY: No anemia, easy bruising or bleeding SKIN: No rash or lesion. MUSCULOSKELETAL: Positive for arthritis NEUROLOGIC: Positive Facial droop, slurred speech positive for dementia PSYCHIATRY: No anxiety or depression.   MEDICATIONS AT HOME:   Prior to Admission medications   Medication Sig Start Date End Date Taking? Authorizing Provider  acetaminophen (TYLENOL) 325 MG tablet Take 2 tablets (650 mg total) by mouth every 6 (six) hours as needed for mild pain (or Fever >/= 101). 11/22/15  Yes Nicholes Mango, MD  brinzolamide (AZOPT) 1 % ophthalmic suspension Place 1 drop into both eyes 2 (two) times daily.  Yes Historical Provider, MD  cholestyramine (QUESTRAN) 4 g packet Take 1 packet (4 g total) by mouth 2 (two) times daily. 11/22/15  Yes Nicholes Mango, MD  ciprofloxacin (CIPRO) 250 MG tablet Take 250 mg by mouth 2 (two) times daily. 01/04/16 01/11/16 Yes Historical Provider, MD  diltiazem (CARDIZEM CD) 120 MG 24 hr capsule Take 1 capsule (120 mg total) by mouth daily. 12/01/15  Yes Demetrios Loll, MD  donepezil (ARICEPT) 10 MG tablet Take 1 tablet (10 mg total) by mouth at bedtime. 11/22/15  Yes Nicholes Mango, MD  dorzolamide-timolol (COSOPT) 22.3-6.8 MG/ML ophthalmic solution Place 1 drop into the left eye 2 (two) times daily.   Yes Historical Provider, MD  latanoprost (XALATAN) 0.005 % ophthalmic solution Place 1 drop into both eyes at bedtime.   Yes Historical Provider, MD  loperamide (IMODIUM) 2 MG capsule Take 4 mg by mouth as needed for diarrhea or loose stools.   Yes Historical Provider, MD  sertraline (ZOLOFT) 50 MG tablet Take 0.5 tablets (25 mg total) by mouth daily. 11/24/15  Yes Leone Haven, MD      VITAL SIGNS:  Blood pressure 145/83, pulse 61, temperature 97.6 F (36.4 C), temperature source Oral, resp. rate 11, weight 61.689 kg (136 lb), SpO2 98 %.  PHYSICAL EXAMINATION:  GENERAL:  82 y.o.-year-old patient lying in the bed with no acute distress.  EYES: Pupils equal, round, reactive to light and accommodation. No scleral icterus. Extraocular muscles intact.  HEENT: Head atraumatic, normocephalic. Oropharynx and nasopharynx clear.  NECK:  Supple, no jugular venous distention. No thyroid enlargement, no tenderness.  LUNGS: Normal breath sounds bilaterally, no wheezing, rales,rhonchi or crepitation. No use of accessory muscles of respiration.  CARDIOVASCULAR: S1, S2 normal. No murmurs, rubs, or gallops.  ABDOMEN: Soft, nontender, nondistended. Bowel sounds present. No organomegaly or mass.  EXTREMITIES: No pedal edema, cyanosis, or clubbing.  NEUROLOGIC: Positive left-sided facial droop upper extremity 4-5 strength lower extremity 4 minus out of 5 bilaterally. Sensation is intact bilaterally. PSYCHIATRIC: The patient is alert and oriented x name  SKIN: No obvious rash, lesion, or ulcer.   LABORATORY PANEL:   CBC  Recent Labs Lab 01/07/16 0906  WBC 7.4  HGB 12.8*  HCT 37.2*  PLT 191   ------------------------------------------------------------------------------------------------------------------  Chemistries   Recent Labs Lab 01/07/16 0906  NA 136  K 3.7   CL 109  CO2 21*  GLUCOSE 123*  BUN 31*  CREATININE 1.42*  CALCIUM 8.4*  AST 19  ALT 13*  ALKPHOS 51  BILITOT 1.1   ------------------------------------------------------------------------------------------------------------------  Cardiac Enzymes  Recent Labs Lab 01/07/16 0906  TROPONINI <0.03   ------------------------------------------------------------------------------------------------------------------  RADIOLOGY:  Ct Head Wo Contrast  01/07/2016  CLINICAL DATA:  Left facial droop.  Left-sided weakness. EXAM: CT HEAD WITHOUT CONTRAST TECHNIQUE: Contiguous axial images were obtained from the base of the skull through the vertex without contrast. COMPARISON:  Head CT 10/26/2015 FINDINGS: There is stable cerebral atrophy, particularly along the frontal lobes. No evidence for acute hemorrhage, mass lesion, midline shift, hydrocephalus or large infarct. Focal low-density along the anterior limb of the right internal capsule is stable. Small amount of low-density in the periventricular white matter is suggestive for chronic changes. Mucosal disease or small polyps in the maxillary sinuses. No calvarial fracture. IMPRESSION: No acute intracranial abnormality. Electronically Signed   By: Markus Daft M.D.   On: 01/07/2016 09:34    EKG:  Normal sinus rhythm rate 62 no ST elevation or depression. Incomplete right bundle-branch block  IMPRESSION AND PLAN:   82 year old male with dementia from [facility] presents with acute CVA. 1. Acute CVA: Patient will be admitted to hospital service.  Neurology will be consulted and case will be discussed. Continue neuro checks. Patient did not pass swallow evaluation and therefore will require aspirin PR.  Obtain speech, PT and OT consultation. MRI of brain and carotid Dopplers have been ordered. At this time will not order echocardiogram, as per my conversation with daughter/POA. Hold diltiazem for now and allow brain perfusion due to acute  CVA.   2. Dementia: Patient is currently nothing by mouth. Watch out for delirium. He is on Aricept and Zoloft. 3.. Glaucoma: Continue eyedrops.  4. Essential hypertension: Hold hypertensive medications and allow brain perfusion.   All the records are reviewed and case discussed with ED provider. Management plans discussed with the patient's daughter and she is in agreement.  CODE STATUS: *DNR  TOTAL TIME TAKING CARE OF THIS PATIENT: 50 minutes.    Stanislaw Acton M.D on 01/07/2016 at 10:37 AM  Between 7am to 6pm - Pager - [phone] After 6pm go to [email or web address] - password [email or web address] [facility]  [facility]  Office  [phone]  CC: Primary care physician; [name], MD

## 2016-01-07 NOTE — ED Provider Notes (Signed)
[facility] Emergency Department Provider Note   L5 caveat: Review of systems and history is limited by dementia.  Time seen: ----------------------------------------- 9:01 AM on 01/07/2016 -----------------------------------------    I have reviewed the triage vital signs and the nursing notes.   HISTORY  Chief Complaint No chief complaint on file.    HPI [name] is a 82 y.o. male who presents to ER being sent by the nursing home for left-sided weakness and facial droop. Patient was last seen normal around 3-4 PM, he was noted to have facial droop and left-sided weakness this morning when he was checked on. He currently lives at [facility]. Patient denies complaints at this time other than he feels like he needs to urinate, history is limited by dementia.   Past Medical History  Diagnosis Date  . Arrhythmia     pvcs  . Hyperlipidemia   . Cancer ([place])     bladder  . BPH (benign prostatic hyperplasia)   . Hypertension   . PONV (postoperative nausea and vomiting)   . Glaucoma   . Fracture of right talus     diagonosed 12/22/13- last f/u note on chart dated 01/02/14.     Patient Active Problem List   Diagnosis Date Noted  . Diarrhea 11/25/2015  . Acute kidney injury ([place]) 11/25/2015  . Protein-calorie malnutrition, severe 11/17/2015  . UTI (lower urinary tract infection) 11/15/2015  . Branch retinal vein occlusion 11/11/2015  . Encounter for wound re-check 11/02/2015  . Fall with injury to face 10/26/2015  . Cataract, post subcapsular polar senile 10/04/2015  . Cataracts, bilateral 10/01/2015  . Essential hypertension 08/27/2015  . Dementia 08/27/2015  . Primary open angle glaucoma 02/17/2015  . Bladder cancer ([place]) 11/17/2013  . Gross hematuria 11/17/2013  . Cataract, nuclear sclerotic senile 07/31/2011  . Dyslipidemia 06/09/2011  . PVC (premature ventricular contraction) 06/09/2011    Past Surgical History  Procedure Laterality  Date  . Bladder surgery  4 or 5 years ago    "cut bladder cancer out"  . Tonsillectomy and adenoidectomy  as child  . Cystoscopy/retrograde/ureteroscopy Bilateral 11/17/2013    Procedure: CYSTOSCOPY, bilateral /RETROGRADE/ right URETEROSCOPY, right stent, bladder fulgeration;  Surgeon: Bernestine Amass, MD;  Location: [facility];  Service: Urology;  Laterality: Bilateral;  . Cystoscopy with retrograde pyelogram, ureteroscopy and stent placement Bilateral 01/12/2014    Procedure: CYSTOSCOPY WITH BILATERAL RETROGRADE PYELOGRAM/URETERAL STENT REMOVAL, URETEROSCOPY, LEFT  AND  BLADDER BIOPSY;  Surgeon: Bernestine Amass, MD;  Location: [facility];  Service: Urology;  Laterality: Bilateral;    Allergies Review of patient's allergies indicates no known allergies.  Social History Social History  Substance Use Topics  . Smoking status: Never Smoker   . Smokeless tobacco: Never Used  . Alcohol Use: No     Comment: rare    Review of Systems Constitutional: Negative for fever. Eyes: Negative for visual changes. ENT: Negative for sore throat. Cardiovascular: Negative for chest pain. Respiratory: Negative for shortness of breath. Gastrointestinal: Negative for abdominal pain, vomiting and diarrhea. Genitourinary: Positive for dysuria Musculoskeletal: Negative for back pain. Skin: Negative for rash. Neurological: Positive for facial droop and left-sided weakness  10-point ROS otherwise negative.  ____________________________________________   PHYSICAL EXAM:  VITAL SIGNS: ED Triage Vitals  Enc Vitals Group     BP --      Pulse --      Resp --      Temp --      Temp src --  SpO2 --      Weight --      Height --      Head Cir --      Peak Flow --      Pain Score --      Pain Loc --      Pain Edu? --      Excl. in [place]? --     Constitutional: Alert , No acute distress Eyes: Conjunctivae are normal. 3 mm and equal bilaterally. Normal extraocular movements. ENT   Head: Normocephalic  and atraumatic.   Nose: No congestion/rhinnorhea.   Mouth/Throat: Mucous membranes are moist.   Neck: No stridor. Cardiovascular: Normal rate, regular rhythm. Normal and symmetric distal pulses are present in all extremities. No murmurs, rubs, or gallops. Respiratory: Normal respiratory effort without tachypnea nor retractions. Breath sounds are clear and equal bilaterally. No wheezes/rales/rhonchi. Gastrointestinal: Soft and nontender. No distention. No abdominal bruits.  Musculoskeletal: Nontender with normal range of motion in all extremities.  Neurologic:  Normal speech and language. Obvious left-sided facial droop with forehead sparing. Possible left-sided weakness in the arm and leg. Skin:  Skin is warm, dry and intact. No rash noted. Psychiatric: Mood and affect are normal. ____________________________________________  EKG: Interpreted by me. Normal sinus rhythm with sinus arrhythmia, rate is 62 bpm, normal PR interval, normal QRS, normal QT interval. Possible incomplete right bundle branch block, normal axis.  ____________________________________________  ED COURSE:  Pertinent labs & imaging results that were available during my care of the patient were reviewed by me and considered in my medical decision making (see chart for details). Patient with possible CVA but I'm unclear as to the time of onset. Last seen normal greater than 12 hours ago. ____________________________________________    LABS (pertinent positives/negatives)  Labs Reviewed  CBC WITH DIFFERENTIAL/PLATELET - Abnormal; Notable for the following:    RBC 4.06 (*)    Hemoglobin 12.8 (*)    HCT 37.2 (*)    RDW 14.9 (*)    Lymphs Abs 0.9 (*)    All other components within normal limits  COMPREHENSIVE METABOLIC PANEL  TROPONIN I  URINALYSIS COMPLETEWITH MICROSCOPIC ([facility] ONLY)    RADIOLOGY  CT head Is unremarkable ____________________________________________  FINAL ASSESSMENT AND  PLAN  Facial droop, speech disturbance, left-sided weakness  Plan: Patient with labs and imaging as dictated above. Patient clinically with CVA. Again it is unknown the time of onset, I have given him aspirin. Family states his speech is also abnormal at this time. He would benefit from admission and further evaluation including MRI.   Earleen Newport, MD   Earleen Newport, MD 01/07/16 [phone]

## 2016-01-07 NOTE — ED Notes (Signed)
Patient transported to CT 

## 2016-01-07 NOTE — Progress Notes (Signed)
Chart reviewed. Pt is currently out of the room for tests. Spoke with daughter regarding current and previous status. Daughter reports that today Pt is restless but sleepy. Speech is slurred. Dementia at baseline with intermitent confusion. Pt is on a [facility] diet at [facility]. Today, pt is NPO and did not pass the Nsg swallow screening. Rec continue NPO for now. ST to assess later today or in the am. Daughter reports that Pt has not been asking for food or water since he was admitted. If Po meds are necessary prior to ST eval, consider only crushable meds in applesauce.

## 2016-01-08 LAB — LIPID PANEL
CHOL/HDL RATIO: 6.7 ratio
CHOLESTEROL: 182 mg/dL (ref 0–200)
HDL: 27 mg/dL — ABNORMAL LOW (ref >40)
LDL CALC: 135 mg/dL — AB (ref 0–99)
Triglycerides: 102 mg/dL (ref <150)
VLDL: 20 mg/dL (ref 0–40)

## 2016-01-08 LAB — BASIC METABOLIC PANEL
ANION GAP: 5 (ref 5–15)
BUN: 24 mg/dL — ABNORMAL HIGH (ref 6–20)
CHLORIDE: 114 mmol/L — AB (ref 101–111)
CO2: 21 mmol/L — ABNORMAL LOW (ref 22–32)
Calcium: 8.3 mg/dL — ABNORMAL LOW (ref 8.9–10.3)
Creatinine, Ser: 1.2 mg/dL (ref 0.61–1.24)
GFR calc Af Amer: 60 mL/min — ABNORMAL LOW (ref >60)
GFR, EST NON AFRICAN AMERICAN: 51 mL/min — AB (ref >60)
GLUCOSE: 91 mg/dL (ref 65–99)
POTASSIUM: 3.3 mmol/L — AB (ref 3.5–5.1)
Sodium: 140 mmol/L (ref 135–145)

## 2016-01-08 LAB — HEMOGLOBIN A1C: HEMOGLOBIN A1C: 5.6 % (ref 4.0–6.0)

## 2016-01-08 MED ORDER — KCL IN DEXTROSE-NACL 20-5-0.45 MEQ/L-%-% IV SOLN
INTRAVENOUS | Status: DC
  Administered 2016-01-08 – 2016-01-11 (×5): via INTRAVENOUS
  Filled 2016-01-08 (×7): qty 1000

## 2016-01-08 NOTE — Consult Note (Signed)
Referring Physician: Ether Griffins    Chief Complaint: Left sided weakness  HPI: [name] is an 82 y.o. male SNF resident who was unable to be awakened as usual by the staff on the morning of 3/17.  Family was notified.  When they arrived they noted a facial droop and left sided weakness.  Patient was brought to the ED at that time.  When spoken to the night prior the patient was at baseline.  Patient has dementia.  Has not been eating or drinking well for the past 2 months.  Is ambulatory.   Initial NIHSS of 12.    Date last known well: 01/06/2016 Time last known well: Time: 18:00 tPA Given: No: Outside time window  Modified Rankin: Rankin Score=3  Past Medical History  Diagnosis Date  . Arrhythmia     pvcs  . Hyperlipidemia   . Cancer ([place])     bladder  . BPH (benign prostatic hyperplasia)   . Hypertension   . PONV (postoperative nausea and vomiting)   . Glaucoma   . Fracture of right talus     diagonosed 12/22/13- last f/u note on chart dated 01/02/14.     Past Surgical History  Procedure Laterality Date  . Bladder surgery  4 or 5 years ago    "cut bladder cancer out"  . Tonsillectomy and adenoidectomy  as child  . Cystoscopy/retrograde/ureteroscopy Bilateral 11/17/2013    Procedure: CYSTOSCOPY, bilateral /RETROGRADE/ right URETEROSCOPY, right stent, bladder fulgeration;  Surgeon: Bernestine Amass, MD;  Location: [facility];  Service: Urology;  Laterality: Bilateral;  . Cystoscopy with retrograde pyelogram, ureteroscopy and stent placement Bilateral 01/12/2014    Procedure: CYSTOSCOPY WITH BILATERAL RETROGRADE PYELOGRAM/URETERAL STENT REMOVAL, URETEROSCOPY, LEFT  AND  BLADDER BIOPSY;  Surgeon: Bernestine Amass, MD;  Location: [facility];  Service: Urology;  Laterality: Bilateral;    Family History  Problem Relation Age of Onset  . Heart attack Father 17    deceased from mi  . Hypertension Father   . Stroke Father   . Breast cancer Mother 38    breast cancer  . Cancer Mother     Social History:  reports that he has never smoked. He has never used smokeless tobacco. He reports that he does not drink alcohol or use illicit drugs.  Allergies: No Known Allergies  Medications:  I have reviewed the patient's current medications. Prior to Admission:  Prescriptions prior to admission  Medication Sig Dispense Refill Last Dose  . acetaminophen (TYLENOL) 325 MG tablet Take 2 tablets (650 mg total) by mouth every 6 (six) hours as needed for mild pain (or Fever >/= 101).   PRN  . brinzolamide (AZOPT) 1 % ophthalmic suspension Place 1 drop into both eyes 2 (two) times daily.   01/07/2016 at 0800  . cholestyramine (QUESTRAN) 4 g packet Take 1 packet (4 g total) by mouth 2 (two) times daily. 60 each 0 01/07/2016 at 0800  . ciprofloxacin (CIPRO) 250 MG tablet Take 250 mg by mouth 2 (two) times daily.   01/07/2016 at 0800  . diltiazem (CARDIZEM CD) 120 MG 24 hr capsule Take 1 capsule (120 mg total) by mouth daily. 30 capsule 0 01/07/2016 at 0800  . donepezil (ARICEPT) 10 MG tablet Take 1 tablet (10 mg total) by mouth at bedtime. 30 tablet 0 01/06/2016 at 1900  . dorzolamide-timolol (COSOPT) 22.3-6.8 MG/ML ophthalmic solution Place 1 drop into the left eye 2 (two) times daily.   01/07/2016 at 0800  . latanoprost (XALATAN)  0.005 % ophthalmic solution Place 1 drop into both eyes at bedtime.   01/06/2016 at 1900  . loperamide (IMODIUM) 2 MG capsule Take 4 mg by mouth as needed for diarrhea or loose stools.   01/02/2016  . sertraline (ZOLOFT) 50 MG tablet Take 0.5 tablets (25 mg total) by mouth daily. 30 tablet 3 01/07/2016 at 0800   Scheduled: . aspirin  300 mg Rectal Daily  . brinzolamide  1 drop Both Eyes BID  . cholestyramine  4 g Oral BID  . donepezil  10 mg Oral QHS  . dorzolamide-timolol  1 drop Left Eye BID  . enoxaparin (LOVENOX) injection  40 mg Subcutaneous Q24H  . latanoprost  1 drop Both Eyes QHS  . sertraline  25 mg Oral Daily  . sodium chloride flush  3 mL Intravenous Q12H     ROS: Unable to provide  Physical Examination: Blood pressure 155/77, pulse 57, temperature 97.7 F (36.5 C), temperature source Oral, resp. rate 18, weight 61.689 kg (136 lb), SpO2 100 %.  HEENT-  Normocephalic, no lesions, without obvious abnormality.  Normal external eye and conjunctiva.  Normal TM's bilaterally.  Normal auditory canals and external ears. Normal external nose, mucus membranes and septum.  Normal pharynx. Cardiovascular- S1, S2 normal, pulses palpable throughout   Lungs- chest clear, no wheezing, rales, normal symmetric air entry Abdomen- soft, non-tender; bowel sounds normal; no masses,  no organomegaly Extremities- no edema Lymph-no adenopathy palpable Musculoskeletal-mass on right thigh Skin-warm and dry, no hyperpigmentation, vitiligo, or suspicious lesions  Neurological Examination Mental Status: Lethargic.  Speaks little but fluent.  Does not cooperate for the majority of the examination but is able to follow some simple commands.   Cranial Nerves: II: Discs flat bilaterally; Blinks to bilateral confrontation, pupils equal, round, reactive to light and accommodation III,IV, VI: ptosis not present, extra-ocular motions intact bilaterally V,VII: mld decrease in left NLF, facial light touch sensation normal bilaterally VIII: hearing decreased bilaterally IX,X: gag reflex reduced XI: does not perform XII: does not perform Motor: Able to maintain RUE against gravity.  No movement noted in the LUE.  LLE with  1-2/5 strength.  Minimal movement noted of the RLE but better tone than left. Sensory: Pinprick and light touch intact throughout, bilaterally Deep Tendon Reflexes: Reports pain with DTR testing and unable to complete.  Intact and symmetric in the upper extremities.   Plantars: Right: upgoing   Left: upgoing Cerebellar: Patient does not cooperate to perform Gait: not tested due to safety concerns    Laboratory Studies:  Basic Metabolic  Panel:  Recent Labs Lab 01/07/16 0906 01/08/16 0444  NA 136 140  K 3.7 3.3*  CL 109 114*  CO2 21* 21*  GLUCOSE 123* 91  BUN 31* 24*  CREATININE 1.42* 1.20  CALCIUM 8.4* 8.3*    Liver Function Tests:  Recent Labs Lab 01/07/16 0906  AST 19  ALT 13*  ALKPHOS 51  BILITOT 1.1  PROT 6.4*  ALBUMIN 3.5   No results for input(s): LIPASE, AMYLASE in the last 168 hours. No results for input(s): AMMONIA in the last 168 hours.  CBC:  Recent Labs Lab 01/07/16 0906  WBC 7.4  NEUTROABS 5.9  HGB 12.8*  HCT 37.2*  MCV 91.6  PLT 191    Cardiac Enzymes:  Recent Labs Lab 01/07/16 0906  TROPONINI <0.03    BNP: Invalid input(s): POCBNP  CBG: No results for input(s): GLUCAP in the last 168 hours.  Microbiology: Results for orders placed or  performed during the hospital encounter of 01/07/16  MRSA PCR Screening     Status: None   Collection Time: 01/07/16  6:00 PM  Result Value Ref Range Status   MRSA by PCR NEGATIVE NEGATIVE Final    Comment:        The GeneXpert MRSA Assay (FDA approved for NASAL specimens only), is one component of a comprehensive MRSA colonization surveillance program. It is not intended to diagnose MRSA infection nor to guide or monitor treatment for MRSA infections.     Coagulation Studies: No results for input(s): LABPROT, INR in the last 72 hours.  Urinalysis:  Recent Labs Lab 01/07/16 0906  COLORURINE YELLOW*  LABSPEC 1.008  PHURINE 5.0  GLUCOSEU NEGATIVE  HGBUR 3+*  BILIRUBINUR NEGATIVE  KETONESUR NEGATIVE  PROTEINUR 30*  NITRITE NEGATIVE  LEUKOCYTESUR NEGATIVE    Lipid Panel:    Component Value Date/Time   CHOL 182 01/08/2016 0444   TRIG 102 01/08/2016 0444   HDL 27* 01/08/2016 0444   CHOLHDL 6.7 01/08/2016 0444   VLDL 20 01/08/2016 0444   LDLCALC 135* 01/08/2016 0444    HgbA1C: No results found for: HGBA1C  Urine Drug Screen:  No results found for: LABOPIA, COCAINSCRNUR, LABBENZ, AMPHETMU, THCU, LABBARB   Alcohol Level: No results for input(s): ETH in the last 168 hours.   Imaging: Dg Chest 2 View  01/07/2016  CLINICAL DATA:  Acute chest pain. EXAM: CHEST  2 VIEW COMPARISON:  November 11, 2013. FINDINGS: The heart size and mediastinal contours are within normal limits. Both lungs are clear. No pneumothorax or pleural effusion is noted. The visualized skeletal structures are unremarkable. IMPRESSION: No active cardiopulmonary disease. Electronically Signed   By: Marijo Conception, M.D.   On: 01/07/2016 11:52   Ct Head Wo Contrast  01/07/2016  CLINICAL DATA:  Left facial droop.  Left-sided weakness. EXAM: CT HEAD WITHOUT CONTRAST TECHNIQUE: Contiguous axial images were obtained from the base of the skull through the vertex without contrast. COMPARISON:  Head CT 10/26/2015 FINDINGS: There is stable cerebral atrophy, particularly along the frontal lobes. No evidence for acute hemorrhage, mass lesion, midline shift, hydrocephalus or large infarct. Focal low-density along the anterior limb of the right internal capsule is stable. Small amount of low-density in the periventricular white matter is suggestive for chronic changes. Mucosal disease or small polyps in the maxillary sinuses. No calvarial fracture. IMPRESSION: No acute intracranial abnormality. Electronically Signed   By: Markus Daft M.D.   On: 01/07/2016 09:34   Mr Brain Wo Contrast  01/07/2016  CLINICAL DATA:  82 year old male with left facial droop and left side weakness. Symptoms discover this morning, last seen normal 1600 hours yesterday. Initial encounter. EXAM: MRI HEAD WITHOUT CONTRAST TECHNIQUE: Multiplanar, multiecho pulse sequences of the brain and surrounding structures were obtained without intravenous contrast. COMPARISON:  Noncontrast head CT 0919 hours today, and earlier. FINDINGS: Linear but slightly stellate 10 mm focus of restricted diffusion in the right paracentral pons (series 100, image 19). Interestingly, there might also be a  punctate focus of cortical restricted diffusion in the right parietal lobe on series 100, image 43. This appears to be posterior to the right sensory strip. Mild associated T2 and FLAIR hyperintensity in the affected right pons with no associated hemorrhage or mass effect. Major intracranial vascular flow voids are not well evaluated in the posterior fossa due to motion, and may be decreased in both distal vertebral arteries (series 7, image 2). The basilar artery flow void is maintained.  Proximal anterior circulation flow voids are maintained. No other restricted diffusion. No midline shift, mass effect, evidence of mass lesion, ventriculomegaly, extra-axial collection or acute intracranial hemorrhage. Cervicomedullary junction and pituitary are within normal limits. No chronic cerebral blood products. Mild for age scattered nonspecific cerebral white matter T2 and FLAIR hyperintensity. There does appear to be chronic encephalomalacia of the mesial right temporal lobe including the right hippocampal formation (series 7, image 9). No other cortical encephalomalacia. Grossly negative visualized cervical spine. Mastoids and paranasal sinuses appear clear. No acute orbit or scalp soft tissue finding identified. IMPRESSION: 1. Small acute infarct in the right paracentral pons (basilar perforator artery territory). No associated hemorrhage or mass effect. 2. Possible poor flow in both distal vertebral arteries, not otherwise evaluated on these images. 3. Suggestion also of a punctate acute cortical infarct in the posterior right MCA territory. Favor synchronous small vessel disease given absence of other acute findings. 4. Evidence of nonspecific right mesial temporal lobe encephalomalacia. Otherwise mild for age cerebral white matter signal changes. Electronically Signed   By: Genevie Ann M.D.   On: 01/07/2016 14:14   US Carotid Bilateral  01/07/2016  CLINICAL DATA:  Hypertension, hyperlipidemia and diabetes. History of  dementia. EXAM: BILATERAL CAROTID DUPLEX ULTRASOUND TECHNIQUE: Pearline Cables scale imaging, color Doppler and duplex ultrasound were performed of bilateral carotid and vertebral arteries in the neck. COMPARISON:  Brain MRI - 01/07/2016 FINDINGS: Criteria: Quantification of carotid stenosis is based on velocity parameters that correlate the residual internal carotid diameter with NASCET-based stenosis levels, using the diameter of the distal internal carotid lumen as the denominator for stenosis measurement. The following velocity measurements were obtained: RIGHT ICA:  56/6 cm/sec CCA:  [id number] cm/sec SYSTOLIC ICA/CCA RATIO:  0.7 DIASTOLIC ICA/CCA RATIO:  0.7 ECA:  75 cm/sec LEFT ICA:  38/9 cm/sec CCA:  [id number] cm/sec SYSTOLIC ICA/CCA RATIO:  0.6 DIASTOLIC ICA/CCA RATIO:  1.3 ECA:  62 cm/sec RIGHT CAROTID ARTERY: There is no grayscale evidence of significant intimal thickening or atherosclerotic plaque affecting the interrogated portions of the right carotid system. There are no elevated peak systolic velocities within the interrogated course of the right internal carotid artery to suggest a hemodynamically significant stenosis. RIGHT VERTEBRAL ARTERY:  Antegrade flow LEFT CAROTID ARTERY: There is no grayscale evidence of significant intimal thickening or atherosclerotic plaque affecting the interrogated portions of the left carotid system. There are no elevated peak systolic velocities within the interrogated course of the left internal carotid artery to suggest a hemodynamically significant stenosis. LEFT VERTEBRAL ARTERY:  Antegrade flow IMPRESSION: Unremarkable carotid Doppler ultrasound for age. Electronically Signed   By: Sandi Mariscal M.D.   On: 01/07/2016 15:15    Assessment: 82 y.o. male presenting with left facial droop and left sided weakness.  Patient outside time window for tPA.  Has a history of dementia.  Head CT personally reviewed and shows a small acute right pontine infarct and a small right cortical MCA  infarct.  Family wishes are to not be aggressive.  They have refused echocardiogram.  Carotid dopplers show no hemodynamically significant stenosis.  On further conversation though they would not have wanted any intervention for findings.  On no antiplatelet therapy prior to admission.   LDL 135.    Stroke Risk Factors - hyperlipidemia and hypertension  Plan: 1. PT consult, OT consult, Speech consult 2. Prophylactic therapy-Antiplatelet med: Aspirin - dose 325mg  daily 3. Telemetry monitoring 4. Frequent neuro checks 5. Would start statin therapy based on family wishes.  Alexis Goodell, MD Neurology [phone] 01/08/2016, 12:11 PM

## 2016-01-08 NOTE — Evaluation (Signed)
Occupational Therapy Evaluation Patient Details Name: [name] MRN: [id number] DOB: 08-06-1923 Today's Date: 01/08/2016    History of Present Illness Pt is a 82 y/o male here with L facial droop and L sided weakness.  He has baseline dementia and is very HOH,was admitted after [facility] home staff note am had more L side weakness    Clinical Impression   Pt with  dementia and is very hard of hearing making evaluation very difficult. No family present. He is restless trying to get up , HOH , do appear can read lips but inconsistent. He appear not to understand his new onset  Of weakness, coordination and mobility issues.Move and grip OT's hands with R much stronger but L unable to grip but cannot do formal assessment, unable to move L with OT's attempt.      Follow Up Recommendations  SNF    Equipment Recommendations       Recommendations for Other Services       Precautions / Restrictions        Mobility Bed Mobility                  Transfers                      Balance                                            ADL                                               Vision     Perception     Praxis      Pertinent Vitals/Pain       Hand Dominance Right   Extremity/Trunk Assessment             Communication Communication Communication: Expressive difficulties;Receptive difficulties;HOH   Cognition Arousal/Alertness: Awake/alert Behavior During Therapy: Anxious;Impulsive Overall Cognitive Status: No family/caregiver present to determine baseline cognitive functioning                     General Comments       Exercises   Other Exercises Other Exercises: Pt gripping and moving R arm but cannot follow formal  assessment  Other Exercises: L compare to R - pt unable to grip or hold OT's hand ,   was able to keep arm up and lower slowly but not able to reach over head - again could not asess  formal strength or function  Other Exercises: Pt was restless and want to get up and walk - when told his leg is weak and he can fall " I shall  not fall "    Shoulder Instructions      Home Living Family/patient expects to be discharged to:: Assisted living                                 Additional Comments: Pt on the memory care unit at [facility]      Prior Functioning/Environment          Comments: no family present to ask about ADL's     OT Diagnosis: Generalized  weakness;Hemiplegia non-dominant side   OT Problem List: Decreased strength;Decreased range of motion;Impaired balance (sitting and/or standing);Impaired UE functional use   OT Treatment/Interventions: Therapeutic exercise;Patient/family education;Self-care/ADL training    OT Goals(Current goals can be found in the care plan section) Acute Rehab OT Goals Patient Stated Goal: pt unable to state  OT Frequency: Min 2X/week   Barriers to D/C:            Co-evaluation              End of Session    Activity Tolerance:   Patient left: in bed;with bed alarm set;with call bell/phone within reach   Time: 1330-1348 OT Time Calculation (min): 18 min Charges:  OT General Charges $OT Visit: 1 Procedure OT Evaluation $OT Eval Moderate Complexity: 1 Procedure G-Codes:    Rosalyn Gess Jan 16, 2016, 2:10 PM

## 2016-01-08 NOTE — Progress Notes (Addendum)
Initial Nutrition Assessment  DOCUMENTATION CODES:   Severe malnutrition in context of chronic illness  INTERVENTION:   Meals and Snacks: Cater to patient preferences Medical Food Supplement Therapy: will send HONEY Thick Mighty shakes and Magic Cup on all meal trays  NUTRITION DIAGNOSIS:   Malnutrition related to chronic illness, dysphagia, lethargy/confusion as evidenced by severe depletion of muscle mass, moderate depletion of body fat, percent weight loss.  GOAL:   Patient will meet greater than or equal to 90% of their needs   MONITOR:   PO intake, Supplement acceptance, Labs, Weight trends, Skin, I & O's  REASON FOR ASSESSMENT:   Malnutrition Screening Tool    ASSESSMENT:    Pt admitted with left facial droop and left sided weakness with stroke; pt confused, trying to get out of bed on visit today, nursing notified. Pt reporting he wants something to eat  Past Medical History  Diagnosis Date  . Arrhythmia     pvcs  . Hyperlipidemia   . Cancer ([place])     bladder  . BPH (benign prostatic hyperplasia)   . Hypertension   . PONV (postoperative nausea and vomiting)   . Glaucoma   . Fracture of right talus     diagonosed 12/22/13- last f/u note on chart dated 01/02/14.     Diet Order:  DIET - DYS 1 Room service appropriate?: Yes with Assist; Fluid consistency:: Honey Thick   Energy Intake: no recorded po intake yet, diet just ordered, meal tray on the way   Food and Nutrition Related History: pt has not been eating or drinking well for past 2 months per  MD notes, no family present on visit today  Skin:  Reviewed, no issues  Last BM:  3/17    Recent Labs Lab 01/07/16 0906 01/08/16 0444  NA 136 140  K 3.7 3.3*  CL 109 114*  CO2 21* 21*  BUN 31* 24*  CREATININE 1.42* 1.20  CALCIUM 8.4* 8.3*  GLUCOSE 123* 91    Glucose Profile: No results for input(s): GLUCAP in the last 72 hours. Meds: D5-1/2 NS at 75 ml/hr  Nutrition Focused Physical Exam:  Nutrition-Focused physical exam completed. Findings are moderate fat depletion, moderate to severe muscle depletion, and no edema.   Height:   Ht Readings from Last 1 Encounters:  11/25/15 5\' 11"  (1.803 m)    Weight: per wt encounters, pt with 9.3% wt loss in past month per wt encounters  Wt Readings from Last 1 Encounters:  01/07/16 136 lb (61.689 kg)    Wt Readings from Last 10 Encounters:  01/07/16 136 lb (61.689 kg)  11/25/15 169 lb (76.658 kg)  11/24/15 150 lb 6.4 oz (68.221 kg)  11/15/15 155 lb 3.2 oz (70.398 kg)  11/02/15 161 lb 12.8 oz (73.392 kg)  10/29/15 163 lb 12.8 oz (74.299 kg)  10/26/15 163 lb 6.4 oz (74.118 kg)  10/01/15 172 lb 9.6 oz (78.291 kg)  08/27/15 171 lb 6.4 oz (77.747 kg)  07/28/15 169 lb 12.8 oz (77.021 kg)    BMI:  Body mass index is 18.98 kg/(m^2).  Estimated Nutritional Needs:   Kcal:  1950-2340 kcals using IBW 78 kg  Protein:  78-94 g   Fluid:  1950-2340 mL   EDUCATION NEEDS:   Education needs no appropriate at this time   [place], [place], LDN [phone] Pager  [phone] Weekend/On-Call Pager

## 2016-01-08 NOTE — Progress Notes (Signed)
[place] [facility] [place] NAME: [name]    MR#:  [id number]  DATE OF BIRTH:  1922/12/17  SUBJECTIVE:  CHIEF COMPLAINT:   Chief Complaint  Patient presents with  . Weakness   patient is 82 year old male with past medical history significant for the history of arrhythmias, hyperlipidemia, BPH, bladder cancer, who presents to the hospital with complaints of left-sided weakness, left facial droop. Patient's CT of head revealed acute right pontine and small right cortical MCA infarct. Echocardiogram was not done per family request, carotid ultrasound was unremarkable. The patient's LDL was found to be elevated at 135. No antiplatelet therapy prior to admission. Patient is on aspirin therapy rectally now since he failed swallowing test. Patient is not able to provide any review of systems due to altered mental status  Review of Systems  Unable to perform ROS: mental acuity    VITAL SIGNS: Blood pressure 155/77, pulse 57, temperature 97.7 F (36.5 C), temperature source Oral, resp. rate 18, weight 61.689 kg (136 lb), SpO2 100 %.  PHYSICAL EXAMINATION:   GENERAL:  82 y.o.-year-old patient lying in the bed with no acute distress. Opens eyes. However, not converses, does not even acknowledge, nonverbal, does not follow commands EYES: Pupils equal, round, reactive to light and accommodation. No scleral icterus. Extraocular muscles intact.  HEENT: Head atraumatic, normocephalic. Oropharynx and nasopharynx clear.  NECK:  Supple, no jugular venous distention. No thyroid enlargement, no tenderness.  LUNGS: Normal breath sounds bilaterally, no wheezing, rales,rhonchi or crepitation. No use of accessory muscles of respiration.  CARDIOVASCULAR: S1, S2 normal. No murmurs, rubs, or gallops.  ABDOMEN: Soft, nontender, nondistended. Bowel sounds present. No organomegaly or mass.  EXTREMITIES: No pedal edema, cyanosis, or clubbing.  NEUROLOGIC: Cranial nerves  revealed mild flattening of her left-sided nasal labial fissure. Muscle strength 1-2/5 in left-sided extremities. Sensation not able to assess. Gait not checked.  PSYCHIATRIC: The patient is somnolent, not able to assess orientation, not following commands  SKIN: No obvious rash, lesion, or ulcer.   ORDERS/RESULTS REVIEWED:   CBC  Recent Labs Lab 01/07/16 0906  WBC 7.4  HGB 12.8*  HCT 37.2*  PLT 191  MCV 91.6  MCH 31.5  MCHC 34.3  RDW 14.9*  LYMPHSABS 0.9*  MONOABS 0.5  EOSABS 0.0  BASOSABS 0.0   ------------------------------------------------------------------------------------------------------------------  Chemistries   Recent Labs Lab 01/07/16 0906 01/08/16 0444  NA 136 140  K 3.7 3.3*  CL 109 114*  CO2 21* 21*  GLUCOSE 123* 91  BUN 31* 24*  CREATININE 1.42* 1.20  CALCIUM 8.4* 8.3*  AST 19  --   ALT 13*  --   ALKPHOS 51  --   BILITOT 1.1  --    ------------------------------------------------------------------------------------------------------------------ estimated creatinine clearance is 35.7 mL/min (by C-G formula based on Cr of 1.2). ------------------------------------------------------------------------------------------------------------------ No results for input(s): TSH, T4TOTAL, T3FREE, THYROIDAB in the last 72 hours.  Invalid input(s): FREET3  Cardiac Enzymes  Recent Labs Lab 01/07/16 0906  TROPONINI <0.03   ------------------------------------------------------------------------------------------------------------------ Invalid input(s): POCBNP ---------------------------------------------------------------------------------------------------------------  RADIOLOGY: Dg Chest 2 View  01/07/2016  CLINICAL DATA:  Acute chest pain. EXAM: CHEST  2 VIEW COMPARISON:  November 11, 2013. FINDINGS: The heart size and mediastinal contours are within normal limits. Both lungs are clear. No pneumothorax or pleural effusion is noted. The visualized  skeletal structures are unremarkable. IMPRESSION: No active cardiopulmonary disease. Electronically Signed   By: Marijo Conception, M.D.   On: 01/07/2016 11:52  Ct Head Wo Contrast  01/07/2016  CLINICAL DATA:  Left facial droop.  Left-sided weakness. EXAM: CT HEAD WITHOUT CONTRAST TECHNIQUE: Contiguous axial images were obtained from the base of the skull through the vertex without contrast. COMPARISON:  Head CT 10/26/2015 FINDINGS: There is stable cerebral atrophy, particularly along the frontal lobes. No evidence for acute hemorrhage, mass lesion, midline shift, hydrocephalus or large infarct. Focal low-density along the anterior limb of the right internal capsule is stable. Small amount of low-density in the periventricular white matter is suggestive for chronic changes. Mucosal disease or small polyps in the maxillary sinuses. No calvarial fracture. IMPRESSION: No acute intracranial abnormality. Electronically Signed   By: Markus Daft M.D.   On: 01/07/2016 09:34   Mr Brain Wo Contrast  01/07/2016  CLINICAL DATA:  82 year old male with left facial droop and left side weakness. Symptoms discover this morning, last seen normal 1600 hours yesterday. Initial encounter. EXAM: MRI HEAD WITHOUT CONTRAST TECHNIQUE: Multiplanar, multiecho pulse sequences of the brain and surrounding structures were obtained without intravenous contrast. COMPARISON:  Noncontrast head CT 0919 hours today, and earlier. FINDINGS: Linear but slightly stellate 10 mm focus of restricted diffusion in the right paracentral pons (series 100, image 19). Interestingly, there might also be a punctate focus of cortical restricted diffusion in the right parietal lobe on series 100, image 43. This appears to be posterior to the right sensory strip. Mild associated T2 and FLAIR hyperintensity in the affected right pons with no associated hemorrhage or mass effect. Major intracranial vascular flow voids are not well evaluated in the posterior fossa due  to motion, and may be decreased in both distal vertebral arteries (series 7, image 2). The basilar artery flow void is maintained. Proximal anterior circulation flow voids are maintained. No other restricted diffusion. No midline shift, mass effect, evidence of mass lesion, ventriculomegaly, extra-axial collection or acute intracranial hemorrhage. Cervicomedullary junction and pituitary are within normal limits. No chronic cerebral blood products. Mild for age scattered nonspecific cerebral white matter T2 and FLAIR hyperintensity. There does appear to be chronic encephalomalacia of the mesial right temporal lobe including the right hippocampal formation (series 7, image 9). No other cortical encephalomalacia. Grossly negative visualized cervical spine. Mastoids and paranasal sinuses appear clear. No acute orbit or scalp soft tissue finding identified. IMPRESSION: 1. Small acute infarct in the right paracentral pons (basilar perforator artery territory). No associated hemorrhage or mass effect. 2. Possible poor flow in both distal vertebral arteries, not otherwise evaluated on these images. 3. Suggestion also of a punctate acute cortical infarct in the posterior right MCA territory. Favor synchronous small vessel disease given absence of other acute findings. 4. Evidence of nonspecific right mesial temporal lobe encephalomalacia. Otherwise mild for age cerebral white matter signal changes. Electronically Signed   By: Genevie Ann M.D.   On: 01/07/2016 14:14   US Carotid Bilateral  01/07/2016  CLINICAL DATA:  Hypertension, hyperlipidemia and diabetes. History of dementia. EXAM: BILATERAL CAROTID DUPLEX ULTRASOUND TECHNIQUE: Pearline Cables scale imaging, color Doppler and duplex ultrasound were performed of bilateral carotid and vertebral arteries in the neck. COMPARISON:  Brain MRI - 01/07/2016 FINDINGS: Criteria: Quantification of carotid stenosis is based on velocity parameters that correlate the residual internal carotid  diameter with NASCET-based stenosis levels, using the diameter of the distal internal carotid lumen as the denominator for stenosis measurement. The following velocity measurements were obtained: RIGHT ICA:  56/6 cm/sec CCA:  [id number] cm/sec SYSTOLIC ICA/CCA RATIO:  0.7 DIASTOLIC ICA/CCA RATIO:  0.7  ECA:  75 cm/sec LEFT ICA:  38/9 cm/sec CCA:  [id number] cm/sec SYSTOLIC ICA/CCA RATIO:  0.6 DIASTOLIC ICA/CCA RATIO:  1.3 ECA:  62 cm/sec RIGHT CAROTID ARTERY: There is no grayscale evidence of significant intimal thickening or atherosclerotic plaque affecting the interrogated portions of the right carotid system. There are no elevated peak systolic velocities within the interrogated course of the right internal carotid artery to suggest a hemodynamically significant stenosis. RIGHT VERTEBRAL ARTERY:  Antegrade flow LEFT CAROTID ARTERY: There is no grayscale evidence of significant intimal thickening or atherosclerotic plaque affecting the interrogated portions of the left carotid system. There are no elevated peak systolic velocities within the interrogated course of the left internal carotid artery to suggest a hemodynamically significant stenosis. LEFT VERTEBRAL ARTERY:  Antegrade flow IMPRESSION: Unremarkable carotid Doppler ultrasound for age. Electronically Signed   By: Sandi Mariscal M.D.   On: 01/07/2016 15:15    EKG:  Orders placed or performed during the hospital encounter of 01/07/16  . ED EKG  . ED EKG    ASSESSMENT AND PLAN:  Active Problems:   Stroke (cerebrum) ([place])  #1. Acute stroke in right pontine and the right cortical MCA areas, continue aspirin per rectum, physical therapy, occupational therapy and speech therapy consultation is requested, statin to be initiated when patient is able to take orally. The patient is nothing by mouth, IV fluids, to be administered, awaiting for recovery of swallowing abilities. Discussed this patient's family extensively #2. Hypertension, allowing permissive  hypertension #3. Hyperlipidemia, initiate statin when patient is able to take orally. #4. Dementia, supportive therapy   Management plans discussed with the patient, family and they are in agreement.   DRUG ALLERGIES: No Known Allergies  CODE STATUS:     Code Status Orders        Start     Ordered   01/07/16 1226  Do not attempt resuscitation (DNR)   Continuous    Question Answer Comment  In the event of cardiac or respiratory ARREST Do not call a "code blue"   In the event of cardiac or respiratory ARREST Do not perform Intubation, CPR, defibrillation or ACLS   In the event of cardiac or respiratory ARREST Use medication by any route, position, wound care, and other measures to relive pain and suffering. May use oxygen, suction and manual treatment of airway obstruction as needed for comfort.      01/07/16 1226    Code Status History    Date Active Date Inactive Code Status Order ID Comments User Context   12/01/2015  1:41 PM 12/01/2015  6:45 PM DNR [id number]  Colleen Can, MD Inpatient   11/25/2015  6:08 PM 12/01/2015  1:40 PM Full Code [id number]  Nicholes Mango, MD Inpatient   11/15/2015  9:02 PM 11/22/2015  8:44 PM Full Code [id number]  Fritzi Mandes, MD Inpatient    Advance Directive Documentation        Most Recent Value   Type of Advance Directive  Healthcare Power of Attorney, Living will   Pre-existing out of facility DNR order (yellow form or pink MOST form)     "MOST" Form in Place?        TOTAL TIME TAKING CARE OF THIS PATIENT: 45 minutes.  Discussed with patient's daughter for approximately 10 minutes  Shrika Milos M.D on 01/08/2016 at 1:16 PM  Between 7am to 6pm - Pager - [phone]  After 6pm go to [email or web address] - [facility]  Office  [phone]  CC: Primary care physician; [name], MD

## 2016-01-08 NOTE — Progress Notes (Signed)
Physical Therapy Treatment Patient Details Name: ROSALIE AKEY MRN: [id number] DOB: 02-10-1923 Today's Date: 01/08/2016    History of Present Illness Pt is a 82 y/o male here with L facial droop and L sided weakness.  He has baseline dementia and is very HOH,was admitted after [place] home staff note am had more L side weakness     PT Comments    Patient is very anxious, actively trying to exit the bed when PT entered the room. Patient is very confused and disoriented, does not recall his name, place of birth, etc. He expresses multiple times he wants to get up, unable to maintain L knee extension, proper L hand placement or follow commands consistently. Patient in attempts at standing begins to buckle LLE, multiple attempts provided with similar outcome. At this time patient is not safe for ambulation given inability to advance LLE safely. Given his mental acuity, may need to consider decreasing from 7x/wk to 2-6x per week. Skilled rehabilitation is still indicated to improve his mobility status after medical discharge.   Follow Up Recommendations  SNF     Equipment Recommendations  Rolling walker with 5" wheels (per progress)    Recommendations for Other Services       Precautions / Restrictions Precautions Precautions: Fall Restrictions Weight Bearing Restrictions: No    Mobility  Bed Mobility Overal bed mobility: Needs Assistance Bed Mobility: Supine to Sit;Sit to Supine     Supine to sit: Mod assist Sit to supine: Mod assist   General bed mobility comments: Patient asks for help to get up and with use of HHA is able to complete.   Transfers Overall transfer level: Needs assistance Equipment used: Rolling walker (2 wheeled) Transfers: Sit to/from Stand Sit to Stand: Mod assist;Max assist         General transfer comment: Patient demonstrates very poor LUE hand placement with L knee buckling and unable to maintain in extension. Patient is too anxious/impulsive to respond  to any verbal cuing to change technique.   Ambulation/Gait             General Gait Details: Deferred given significant LLE weakness and inability to follow commands.    Stairs            Wheelchair Mobility    Modified Rankin (Stroke Patients Only)       Balance Overall balance assessment: Needs assistance Sitting-balance support: Single extremity supported Sitting balance-Leahy Scale: Fair Sitting balance - Comments: Appears to have some neglect of L side, LUE intermittently provides support but not consistently.    Standing balance support: Bilateral upper extremity supported Standing balance-Leahy Scale: Poor                      Cognition Arousal/Alertness: Awake/alert Behavior During Therapy: Anxious;Impulsive Overall Cognitive Status: No family/caregiver present to determine baseline cognitive functioning                      Exercises Other Exercises Other Exercises: Pt gripping and moving R arm but cannot follow formal  assessment  Other Exercises: L compare to R - pt unable to grip or hold OT's hand ,   was able to keep arm up and lower slowly but not able to reach over head - again could not asess formal strength or function  Other Exercises: Pt was restless and want to get up and walk - when told his leg is weak and he can fall " I shall  not fall "     General Comments General comments (skin integrity, edema, etc.): Fluid filled sac in R hamstrings area, paged RN who states this is chronic.       Pertinent Vitals/Pain Pain Assessment: Faces Faces Pain Scale: Hurts little more Pain Location: Unable to communicate.  Pain Intervention(s): Limited activity within patient's tolerance    Home Living Family/patient expects to be discharged to:: Assisted living               Additional Comments: Pt on the memory care unit at [facility]    Prior Function        Comments: no family present to ask about ADL's    PT Goals  (current goals can now be found in the care plan section) Acute Rehab PT Goals Patient Stated Goal: pt unable to state PT Goal Formulation: With family Time For Goal Achievement: 01/21/16 Potential to Achieve Goals: Fair Progress towards PT goals: Not progressing toward goals - comment    Frequency  7X/week    PT Plan Current plan remains appropriate (If unable to follow commands in next session, consider making 2-6x per week)    Co-evaluation             End of Session Equipment Utilized During Treatment: Gait belt Activity Tolerance: Patient limited by fatigue (Inability to follow commands, fatigue throughout session) Patient left: with bed alarm set;with call bell/phone within reach;with family/visitor present;in bed     Time: [id number] PT Time Calculation (min) (ACUTE ONLY): 19 min  Charges:  $Gait Training: 8-22 mins                    G Codes:      Kerman Passey, PT, DPT    01/08/2016, 4:28 PM

## 2016-01-08 NOTE — Plan of Care (Signed)
Problem: SLP Dysphagia Goals Goal: Misc Dysphagia Goal Pt will safely tolerate po diet of least restrictive consistency w/ no overt s/s of aspiration noted by Staff/pt/family x3 sessions.    

## 2016-01-08 NOTE — Evaluation (Signed)
Clinical/Bedside Swallow Evaluation Patient Details  Name: [name] MRN: [id number] Date of Birth: May 06, 1923  Today's Date: 01/08/2016 Time: SLP Start Time (ACUTE ONLY): 76 SLP Stop Time (ACUTE ONLY): 1130 SLP Time Calculation (min) (ACUTE ONLY): 60 min  Past Medical History:  Past Medical History  Diagnosis Date  . Arrhythmia     pvcs  . Hyperlipidemia   . Cancer ([place])     bladder  . BPH (benign prostatic hyperplasia)   . Hypertension   . PONV (postoperative nausea and vomiting)   . Glaucoma   . Fracture of right talus     diagonosed 12/22/13- last f/u note on chart dated 01/02/14.    Past Surgical History:  Past Surgical History  Procedure Laterality Date  . Bladder surgery  4 or 5 years ago    "cut bladder cancer out"  . Tonsillectomy and adenoidectomy  as child  . Cystoscopy/retrograde/ureteroscopy Bilateral 11/17/2013    Procedure: CYSTOSCOPY, bilateral /RETROGRADE/ right URETEROSCOPY, right stent, bladder fulgeration;  Surgeon: Bernestine Amass, MD;  Location: [facility];  Service: Urology;  Laterality: Bilateral;  . Cystoscopy with retrograde pyelogram, ureteroscopy and stent placement Bilateral 01/12/2014    Procedure: CYSTOSCOPY WITH BILATERAL RETROGRADE PYELOGRAM/URETERAL STENT REMOVAL, URETEROSCOPY, LEFT  AND  BLADDER BIOPSY;  Surgeon: Bernestine Amass, MD;  Location: [facility];  Service: Urology;  Laterality: Bilateral;   HPI:  Pt is a 82 y.o. male with a known history of Dementia and BPH who presents from [facility] with facial droop and left-sided weakness. Patient continues to have impairment in his speech and facial droop. He did not pass his swallow evaluation here. Symptoms were noticed by the nurse at [facility] this morning. Pt was just recently d/c'd from the hospital w/ dx of AKI and because of poor oral intake, patient can have recurrent renal failure(per MD note). Prior to that, pt was discharged from the hospital after treating for diarrhea, severe protein energy  malnutrition to an assisted living facility. Family members think that patient is depressed and not drinking enough. Pt is currently not eating but bites and sips; has been drowsy. Suspected Left sided weakness.    Assessment / Plan / Recommendation Clinical Impression  Pt presented w/ apparent s/s of oropharyngeal dysphagia w/ overt s/s of aspiration w/thin liquid consistency from ice chips(water) then ice cream. W/ trials of puree and Honey consistency liquids via TSP, no immediate, overt s/s of aspriation noted. Vocal quality was difficult to assess but no apparent decline in respiratory status during/post trials. Oral phase c/b increased A-P transit and bolus management; min. holding was observed. No trials of solids were assessed sec. to overall declined status. Pt appears at increased risk for aspiration at this time sec. to medical status; Dementia. Recommend initiation of a Dysphagia I diet w/ Honey consistency liquids w/ strict aspiration precautions; meds in puree - crushed. Recommend dietitian and Palliative Care consults.      Aspiration Risk  Moderate aspiration risk    Diet Recommendation  Dys. 1 w/ Honey consistency liquids; strict aspiration precautions; meds in puree - Crushed as able. Feeding assistance at all meals.    Medication Administration: Crushed with puree    Other  Recommendations Recommended Consults:  (Dietician; Palliative care) Oral Care Recommendations: Oral care BID;Staff/trained caregiver to provide oral care Other Recommendations: Order thickener from pharmacy;Prohibited food (jello, ice cream, thin soups);Remove water pitcher   Follow up Recommendations  Skilled Nursing facility (TBD)    Frequency and Duration min 3x  week  2 weeks       Prognosis Prognosis for Safe Diet Advancement: Guarded Barriers to Reach Goals: Cognitive deficits;Severity of deficits      Swallow Study   General Date of Onset: 01/07/16 HPI: Pt is a 82 y.o. male with a known  history of Dementia and BPH who presents from [facility] with facial droop and left-sided weakness. Patient continues to have impairment in his speech and facial droop. He did not pass his swallow evaluation here. Symptoms were noticed by the nurse at [facility] this morning. Pt was just recently d/c'd from the hospital w/ dx of AKI and because of poor oral intake, patient can have recurrent renal failure(per MD note). Prior to that, pt was discharged from the hospital after treating for diarrhea, severe protein energy malnutrition to an assisted living facility. Family members think that patient is depressed and not drinking enough. Pt is currently not eating but bites and sips; has been drowsy. Suspected Left sided weakness.  Type of Study: Bedside Swallow Evaluation Previous Swallow Assessment: none Diet Prior to this Study: Regular;Thin liquids Temperature Spikes Noted: No (wbc not elevated) Respiratory Status: Room air History of Recent Intubation: No Behavior/Cognition: Confused;Distractible;Lethargic/Drowsy;Requires cueing Oral Cavity Assessment:  (unable to fully perform d/t Cognition) Oral Care Completed by SLP: Recent completion by staff Oral Cavity - Dentition: Missing dentition Vision:  (n/a) Self-Feeding Abilities: Total assist Patient Positioning: Upright in bed Baseline Vocal Quality:  (mumbled speech x2) Volitional Cough: Cognitively unable to elicit Volitional Swallow: Unable to elicit    Oral/Motor/Sensory Function Overall Oral Motor/Sensory Function:  (unable to fully assess sec. to Cognitive decline)   Ice Chips Ice chips: Impaired Presentation: Spoon (x3 trials) Oral Phase Impairments: Poor awareness of bolus Oral Phase Functional Implications: Prolonged oral transit Pharyngeal Phase Impairments: Cough - Immediate (x1/3 trials)   Thin Liquid Thin Liquid: Not tested    Nectar Thick Nectar Thick Liquid: Not tested   Honey Thick Honey Thick Liquid:  Impaired Presentation: Spoon (6 trials) Oral Phase Impairments: Poor awareness of bolus Oral Phase Functional Implications: Prolonged oral transit Pharyngeal Phase Impairments:  (none)   Puree Puree: Impaired Presentation: Spoon (fed; 6 trials) Oral Phase Impairments: Poor awareness of bolus Oral Phase Functional Implications: Prolonged oral transit Pharyngeal Phase Impairments:  (none) Other Comments: pt attempted trials of ice cream w/ throat clearing/cough occuring by the 4th trial - suspect too loose and thin   Solid   GO   Solid: Not tested        Orinda Kenner, MS, CCC-SLP  Derrian Poli 01/08/2016,2:05 PM

## 2016-01-09 LAB — BASIC METABOLIC PANEL
ANION GAP: 5 (ref 5–15)
BUN: 17 mg/dL (ref 6–20)
CALCIUM: 8.5 mg/dL — AB (ref 8.9–10.3)
CO2: 21 mmol/L — AB (ref 22–32)
CREATININE: 1.15 mg/dL (ref 0.61–1.24)
Chloride: 110 mmol/L (ref 101–111)
GFR, EST NON AFRICAN AMERICAN: 54 mL/min — AB (ref >60)
Glucose, Bld: 112 mg/dL — ABNORMAL HIGH (ref 65–99)
Potassium: 3.7 mmol/L (ref 3.5–5.1)
Sodium: 136 mmol/L (ref 135–145)

## 2016-01-09 LAB — HEMOGLOBIN: Hemoglobin: 13.6 g/dL (ref 13.0–18.0)

## 2016-01-09 MED ORDER — HYDRALAZINE HCL 20 MG/ML IJ SOLN: 10.0000 mg | Freq: Four times a day (QID) | INTRAMUSCULAR | Status: DC | PRN

## 2016-01-09 MED ORDER — HALOPERIDOL LACTATE 5 MG/ML IJ SOLN
1.0000 mg | Freq: Four times a day (QID) | INTRAMUSCULAR | Status: DC | PRN
  Administered 2016-01-09: 17:00:00 1 mg via INTRAVENOUS
  Filled 2016-01-09: qty 1

## 2016-01-09 MED ORDER — METOPROLOL TARTRATE 1 MG/ML IV SOLN
5.0000 mg | Freq: Once | INTRAVENOUS | Status: AC
  Administered 2016-01-09: 20:00:00 5 mg via INTRAVENOUS
  Filled 2016-01-09: qty 5

## 2016-01-09 MED ORDER — HALOPERIDOL 1 MG PO TABS: 1.0000 mg | ORAL_TABLET | Freq: Four times a day (QID) | ORAL | Status: DC | PRN

## 2016-01-09 MED ORDER — ASPIRIN 325 MG PO TABS
325.0000 mg | ORAL_TABLET | Freq: Every day | ORAL | Status: DC
  Administered 2016-01-09 – 2016-01-11 (×2): 325 mg via ORAL
  Filled 2016-01-09 (×2): qty 1

## 2016-01-09 MED ORDER — HALOPERIDOL 1 MG PO TABS
1.0000 mg | ORAL_TABLET | Freq: Four times a day (QID) | ORAL | Status: DC | PRN
  Administered 2016-01-10: 02:00:00 1 mg via ORAL
  Filled 2016-01-09 (×2): qty 1

## 2016-01-09 MED ORDER — HALOPERIDOL LACTATE 5 MG/ML IJ SOLN: 1.0000 mg | Freq: Four times a day (QID) | INTRAMUSCULAR | Status: DC | PRN

## 2016-01-09 MED ORDER — ATORVASTATIN CALCIUM 20 MG PO TABS
40.0000 mg | ORAL_TABLET | Freq: Every day | ORAL | Status: DC
  Administered 2016-01-10: 21:00:00 40 mg via ORAL
  Filled 2016-01-09 (×2): qty 2

## 2016-01-09 NOTE — Progress Notes (Addendum)
[facility] [place] NAME: [name]    MR#:  [id number]  DATE OF BIRTH:  1923-10-10  SUBJECTIVE:  CHIEF COMPLAINT:   Chief Complaint  Patient presents with  . Weakness   patient is 82 year old male with past medical history significant for the history of arrhythmias, hyperlipidemia, BPH, bladder cancer, who presents to the hospital with complaints of left-sided weakness, left facial droop. Patient's CT of head revealed acute right pontine and small right cortical MCA infarct. Echocardiogram was not done per family request, carotid ultrasound was unremarkable. The patient's LDL was found to be elevated at 135. No antiplatelet therapy prior to admission. The patient was evaluated by speech therapist and recommended most restricted diet, aspirin and Lipitor was initiated today orally Patient is nonverbal today, somewhat restless, not able to review systems   Review of Systems  Unable to perform ROS: mental acuity    VITAL SIGNS: Blood pressure 169/76, pulse 56, temperature 97.6 F (36.4 C), temperature source Oral, resp. rate 20, height 6\' 1"  (1.854 m), weight 61.689 kg (136 lb), SpO2 99 %.  PHYSICAL EXAMINATION:   GENERAL:  82 y.o.-year-old patient lying in the bed with no acute distress. , somewhat restless, Opens eyes, moves all extremities, although decreased movements in left side, some shallow left nasolabial crease . Does  not converse, does not even acknowledge, nonverbal, does not follow commands EYES: Pupils equal, round, reactive to light and accommodation. No scleral icterus. Extraocular muscles intact.  HEENT: Head atraumatic, normocephalic. Oropharynx and nasopharynx clear.  NECK:  Supple, no jugular venous distention. No thyroid enlargement, no tenderness.  LUNGS: Normal breath sounds bilaterally, no wheezing, rales,rhonchi or crepitation. No use of accessory muscles of respiration.  CARDIOVASCULAR: S1, S2 normal. No  murmurs, rubs, or gallops.  ABDOMEN: Soft, nontender, nondistended. Bowel sounds present. No organomegaly or mass.  EXTREMITIES: No pedal edema, cyanosis, or clubbing.  NEUROLOGIC: Cranial nerves revealed mild flattening of her left-sided nasal labial fissure. Muscle strength 2/5 in left-sided extremities. Sensation not able to assess. Gait not checked.  PSYCHIATRIC: The patient is somnolent, not able to assess orientation, not following commands  SKIN: No obvious rash, lesion, or ulcer.   ORDERS/RESULTS REVIEWED:   CBC  Recent Labs Lab 01/07/16 0906 01/09/16 0436  WBC 7.4  --   HGB 12.8* 13.6  HCT 37.2*  --   PLT 191  --   MCV 91.6  --   MCH 31.5  --   MCHC 34.3  --   RDW 14.9*  --   LYMPHSABS 0.9*  --   MONOABS 0.5  --   EOSABS 0.0  --   BASOSABS 0.0  --    ------------------------------------------------------------------------------------------------------------------  Chemistries   Recent Labs Lab 01/07/16 0906 01/08/16 0444 01/09/16 0436  NA 136 140 136  K 3.7 3.3* 3.7  CL 109 114* 110  CO2 21* 21* 21*  GLUCOSE 123* 91 112*  BUN 31* 24* 17  CREATININE 1.42* 1.20 1.15  CALCIUM 8.4* 8.3* 8.5*  AST 19  --   --   ALT 13*  --   --   ALKPHOS 51  --   --   BILITOT 1.1  --   --    ------------------------------------------------------------------------------------------------------------------ estimated creatinine clearance is 37.3 mL/min (by C-G formula based on Cr of 1.15). ------------------------------------------------------------------------------------------------------------------ No results for input(s): TSH, T4TOTAL, T3FREE, THYROIDAB in the last 72 hours.  Invalid input(s): FREET3  Cardiac Enzymes  Recent Labs Lab 01/07/16 [phone]  TROPONINI <0.03   ------------------------------------------------------------------------------------------------------------------ Invalid input(s):  POCBNP ---------------------------------------------------------------------------------------------------------------  RADIOLOGY: Mr Brain Wo Contrast  01/07/2016  CLINICAL DATA:  82 year old male with left facial droop and left side weakness. Symptoms discover this morning, last seen normal 1600 hours yesterday. Initial encounter. EXAM: MRI HEAD WITHOUT CONTRAST TECHNIQUE: Multiplanar, multiecho pulse sequences of the brain and surrounding structures were obtained without intravenous contrast. COMPARISON:  Noncontrast head CT 0919 hours today, and earlier. FINDINGS: Linear but slightly stellate 10 mm focus of restricted diffusion in the right paracentral pons (series 100, image 19). Interestingly, there might also be a punctate focus of cortical restricted diffusion in the right parietal lobe on series 100, image 43. This appears to be posterior to the right sensory strip. Mild associated T2 and FLAIR hyperintensity in the affected right pons with no associated hemorrhage or mass effect. Major intracranial vascular flow voids are not well evaluated in the posterior fossa due to motion, and may be decreased in both distal vertebral arteries (series 7, image 2). The basilar artery flow void is maintained. Proximal anterior circulation flow voids are maintained. No other restricted diffusion. No midline shift, mass effect, evidence of mass lesion, ventriculomegaly, extra-axial collection or acute intracranial hemorrhage. Cervicomedullary junction and pituitary are within normal limits. No chronic cerebral blood products. Mild for age scattered nonspecific cerebral white matter T2 and FLAIR hyperintensity. There does appear to be chronic encephalomalacia of the mesial right temporal lobe including the right hippocampal formation (series 7, image 9). No other cortical encephalomalacia. Grossly negative visualized cervical spine. Mastoids and paranasal sinuses appear clear. No acute orbit or scalp soft tissue  finding identified. IMPRESSION: 1. Small acute infarct in the right paracentral pons (basilar perforator artery territory). No associated hemorrhage or mass effect. 2. Possible poor flow in both distal vertebral arteries, not otherwise evaluated on these images. 3. Suggestion also of a punctate acute cortical infarct in the posterior right MCA territory. Favor synchronous small vessel disease given absence of other acute findings. 4. Evidence of nonspecific right mesial temporal lobe encephalomalacia. Otherwise mild for age cerebral white matter signal changes. Electronically Signed   By: Genevie Ann M.D.   On: 01/07/2016 14:14   US Carotid Bilateral  01/07/2016  CLINICAL DATA:  Hypertension, hyperlipidemia and diabetes. History of dementia. EXAM: BILATERAL CAROTID DUPLEX ULTRASOUND TECHNIQUE: Pearline Cables scale imaging, color Doppler and duplex ultrasound were performed of bilateral carotid and vertebral arteries in the neck. COMPARISON:  Brain MRI - 01/07/2016 FINDINGS: Criteria: Quantification of carotid stenosis is based on velocity parameters that correlate the residual internal carotid diameter with NASCET-based stenosis levels, using the diameter of the distal internal carotid lumen as the denominator for stenosis measurement. The following velocity measurements were obtained: RIGHT ICA:  56/6 cm/sec CCA:  [id number] cm/sec SYSTOLIC ICA/CCA RATIO:  0.7 DIASTOLIC ICA/CCA RATIO:  0.7 ECA:  75 cm/sec LEFT ICA:  38/9 cm/sec CCA:  [id number] cm/sec SYSTOLIC ICA/CCA RATIO:  0.6 DIASTOLIC ICA/CCA RATIO:  1.3 ECA:  62 cm/sec RIGHT CAROTID ARTERY: There is no grayscale evidence of significant intimal thickening or atherosclerotic plaque affecting the interrogated portions of the right carotid system. There are no elevated peak systolic velocities within the interrogated course of the right internal carotid artery to suggest a hemodynamically significant stenosis. RIGHT VERTEBRAL ARTERY:  Antegrade flow LEFT CAROTID ARTERY: There is no  grayscale evidence of significant intimal thickening or atherosclerotic plaque affecting the interrogated portions of the left carotid system. There are no elevated peak systolic velocities within the interrogated course  of the left internal carotid artery to suggest a hemodynamically significant stenosis. LEFT VERTEBRAL ARTERY:  Antegrade flow IMPRESSION: Unremarkable carotid Doppler ultrasound for age. Electronically Signed   By: Sandi Mariscal M.D.   On: 01/07/2016 15:15    EKG:  Orders placed or performed during the hospital encounter of 01/07/16  . ED EKG  . ED EKG    ASSESSMENT AND PLAN:  Active Problems:   Stroke (cerebrum) ([place])  #1. Acute stroke in right pontine and the right cortical MCA areas, continue aspirin , Lipitor, orally, physical therapy, occupational therapy and speech therapy consults are appreciated, patient will be sent to skilled nursing facility, likely early next week .  #2.  essential Hypertension, allowing permissive hypertension #3. Hyperlipidemia, initiating  statin as patient was able to take orally the most restricted diet, supported by SLP . #4. Dementia, supportive care  #5. History of diarrhea, resume Imodium or cholestyramine as needed, following closely, continue IV fluids for now while oral intake is unpredictable.    Management plans discussed with the patient, family and they are in agreement.   DRUG ALLERGIES: No Known Allergies  CODE STATUS:     Code Status Orders        Start     Ordered   01/07/16 1226  Do not attempt resuscitation (DNR)   Continuous    Question Answer Comment  In the event of cardiac or respiratory ARREST Do not call a "code blue"   In the event of cardiac or respiratory ARREST Do not perform Intubation, CPR, defibrillation or ACLS   In the event of cardiac or respiratory ARREST Use medication by any route, position, wound care, and other measures to relive pain and suffering. May use oxygen, suction and manual treatment  of airway obstruction as needed for comfort.      01/07/16 1226    Code Status History    Date Active Date Inactive Code Status Order ID Comments User Context   12/01/2015  1:41 PM 12/01/2015  6:45 PM DNR [id number]  Colleen Can, MD Inpatient   11/25/2015  6:08 PM 12/01/2015  1:40 PM Full Code [id number]  Nicholes Mango, MD Inpatient   11/15/2015  9:02 PM 11/22/2015  8:44 PM Full Code [id number]  Fritzi Mandes, MD Inpatient    Advance Directive Documentation        Most Recent Value   Type of Advance Directive  Healthcare Power of Attorney, Living will   Pre-existing out of facility DNR order (yellow form or pink MOST form)     "MOST" Form in Place?        TOTAL TIME TAKING CARE OF THIS PATIENT: 35 minutes.  Patient's medical status, treatment plan, discharge planning was discussed with his daughter for approximately 10 minutes on the phone. All questions were answered.   Theodoro Grist M.D on 01/09/2016 at 1:59 PM  Between 7am to 6pm - Pager - [phone]  After 6pm go to [email or web address] - password [email or web address] [facility]  [facility]  Office  [phone]  CC: Primary care physician; [name], MD

## 2016-01-09 NOTE — Progress Notes (Signed)
Physical Therapy Treatment Patient Details Name: [name] MRN: [id number] DOB: [birth year] Today's Date: 01/09/2016    History of Present Illness Pt is a 82 y/o male here with L facial droop and L sided weakness.  He has baseline dementia and is very HOH,was admitted after [facility] home staff note am had more L side weakness     PT Comments    Patient lying diagonally in bed, attempting to get OOB upon PT entering, saying "Pull me up." Patient able to move from supine to sit with minimal assistance but required moderate-maximal assistance to stand, secondary to buckling of knee. Unable to follow commands to perform therapeutic exercises. Was able to perform bed mobility with minimal-moderate assistance with one step commands. Patient demonstrates deficits in cognition, muscle strength/endurance, and function which are limiting mobility. Patient will continue to benefit from skilled and progressive PT to allow him to return to PLOF.  Follow Up Recommendations  SNF     Equipment Recommendations  Rolling walker with 5" wheels    Recommendations for Other Services       Precautions / Restrictions Precautions Precautions: Fall Restrictions Weight Bearing Restrictions: No    Mobility  Bed Mobility Overal bed mobility: Needs Assistance Bed Mobility: Supine to Sit     Supine to sit: Min assist Sit to supine: Mod assist   General bed mobility comments: Patient requires minimal assistance to get to EOB. Requires moderate assistance to return to supine due to weakness in L LE. Unable to follow commands to self assist LE. Required assistance for bridging to scoot to [facility].  Transfers Overall transfer level: Needs assistance Equipment used: Rolling walker (2 wheeled) Transfers: Sit to/from Stand Sit to Stand: Mod assist;Max assist         General transfer comment: Patient flucutates between moderate-maximal assistance to move from sit to stand. Patient unable to use L UE with  verbal/tactile cues. Patient's L knee tends to buckle after 10-15".  Ambulation/Gait                 Stairs            Wheelchair Mobility    Modified Rankin (Stroke Patients Only)       Balance Overall balance assessment: Needs assistance Sitting-balance support: Feet supported;Single extremity supported Sitting balance-Leahy Scale: Fair     Standing balance support: Single extremity supported Standing balance-Leahy Scale: Poor                      Cognition Arousal/Alertness: Awake/alert Behavior During Therapy: Flat affect Overall Cognitive Status: History of cognitive impairments - at baseline                      Exercises General Exercises - Upper Extremity Elbow Flexion: AAROM;10 reps;Strengthening Elbow Extension: AAROM;Strengthening;10 reps General Exercises - Lower Extremity Long Arc Quad: AAROM;Strengthening;10 reps Other Exercises Other Exercises: Weightshifting in standing x5; bed mobility (rolling/scooting)    General Comments        Pertinent Vitals/Pain Pain Assessment: No/denies pain    Home Living                      Prior Function            PT Goals (current goals can now be found in the care plan section) Acute Rehab PT Goals Patient Stated Goal: pt unable to state PT Goal Formulation: With patient Time For Goal Achievement: 01/21/16 Potential to  Achieve Goals: Fair Progress towards PT goals: Not progressing toward goals - comment (Limited by fatigue/mental status)    Frequency  7X/week    PT Plan Current plan remains appropriate    Co-evaluation             End of Session Equipment Utilized During Treatment: Gait belt Activity Tolerance: Patient limited by fatigue Patient left: in bed;with call bell/phone within reach;with bed alarm set     Time: 1315-1330 PT Time Calculation (min) (ACUTE ONLY): 15 min  Charges:  $Therapeutic Activity: 8-22 mins                    G Codes:       Dorice Lamas, PT, DPT 01/09/2016, 1:39 PM

## 2016-01-09 NOTE — Progress Notes (Signed)
   01/09/16 1650  What Happened  Was fall witnessed? No  Was patient injured? No  Patient found on floor  Found by Staff-comment Lucious Groves, RN)  Stated prior activity to/from bed, chair, or stretcher  Follow Up  MD notified Ether Griffins  Time MD notified [phone]  Family notified Yes-comment (attempted - no answer)  Additional tests No  Progress note created (see row info) Yes  Adult Fall Risk Assessment  Risk Factor Category (scoring not indicated) Fall has occurred during this admission (document High fall risk)  Patient's Fall Risk High Fall Risk (>13 points)  Adult Fall Risk Interventions  Required Bundle Interventions *See Row Information* High fall risk - low, moderate, and high requirements implemented  Additional Interventions Fall risk signage;Individualized elimination schedule;Reorient/diversional activities with confused patients;Room near nurses station;Secure all tubes/drains;Specialty bed:  Low bed  Fall with Injury Screening  Risk For Fall Injury- See Row Information  A;D;F;Nurse judgement  Intervention(s) for 2 or more risk criteria identified Floor Mat;Low Bed

## 2016-01-09 NOTE — Progress Notes (Signed)
Patient moved closer to nurses station due to impulsiveness, confusion and multiple attempts to get out of bed without assistance. Madlyn Frankel, RN

## 2016-01-09 NOTE — Progress Notes (Signed)
Low bed ordered for patient. Madlyn Frankel, RN

## 2016-01-09 NOTE — Progress Notes (Signed)
PT Cancellation Note  Patient Details Name: [name] MRN: [id number] DOB: [birth year]   Cancelled Treatment:    Reason Eval/Treat Not Completed: Fatigue/lethargy limiting ability to participate. PT attempted treatment at 0915. Patient unable to keep eyes open and follow one step commands. Will check back later if time permits to complete treatment session.   Dorice Lamas, PT, DPT 01/09/2016, 9:41 AM

## 2016-01-10 ENCOUNTER — Inpatient Hospital Stay: Payer: Medicare Other

## 2016-01-10 DIAGNOSIS — R000 Tachycardia, unspecified: Secondary | ICD-10-CM | POA: Diagnosis not present

## 2016-01-10 LAB — URINE CULTURE: Special Requests: NORMAL

## 2016-01-10 MED ORDER — METOPROLOL TARTRATE 1 MG/ML IV SOLN
5.0000 mg | Freq: Once | INTRAVENOUS | Status: AC
  Administered 2016-01-10: 5 mg via INTRAVENOUS

## 2016-01-10 MED ORDER — METOPROLOL TARTRATE 1 MG/ML IV SOLN
INTRAVENOUS | Status: AC
  Filled 2016-01-10: qty 5

## 2016-01-10 MED ORDER — METOPROLOL TARTRATE 25 MG PO TABS
25.0000 mg | ORAL_TABLET | Freq: Once | ORAL | Status: AC
Start: 2016-01-10 — End: 2016-01-10
  Administered 2016-01-10: 25 mg via ORAL
  Filled 2016-01-10: qty 1

## 2016-01-10 MED ORDER — METOPROLOL TARTRATE 25 MG PO TABS
25.0000 mg | ORAL_TABLET | Freq: Two times a day (BID) | ORAL | Status: DC
  Administered 2016-01-10 – 2016-01-11 (×2): 25 mg via ORAL
  Filled 2016-01-10 (×2): qty 1

## 2016-01-10 MED ORDER — METOPROLOL TARTRATE 25 MG PO TABS: 25.0000 mg | ORAL_TABLET | Freq: Two times a day (BID) | ORAL | Status: DC

## 2016-01-10 NOTE — NC FL2 (Signed)
[place] LEVEL OF CARE SCREENING TOOL     IDENTIFICATION  Patient Name: [name] Birthdate: [birth year] Sex: male Admission Date (Current Location): 01/07/2016  [place] and [place] Number:  Engineering geologist and Address:  [facility], [address], [place], [place] [address]      Provider Number: [id number]  Attending Physician Name and Address:  Theodoro Grist, MD  Relative Name and Phone Number:       Current Level of Care: Hospital Recommended Level of Care: [place] Prior Approval Number:    Date Approved/Denied:   PASRR Number:   [id number]   Discharge Plan: SNF    Current Diagnoses: Patient Active Problem List   Diagnosis Date Noted  . Stroke (cerebrum) ([place]) 01/07/2016  . Diarrhea 11/25/2015  . Acute kidney injury ([place]) 11/25/2015  . Protein-calorie malnutrition, severe 11/17/2015  . UTI (lower urinary tract infection) 11/15/2015  . Branch retinal vein occlusion 11/11/2015  . Encounter for wound re-check 11/02/2015  . Fall with injury to face 10/26/2015  . Cataract, post subcapsular polar senile 10/04/2015  . Cataracts, bilateral 10/01/2015  . Essential hypertension 08/27/2015  . Dementia 08/27/2015  . Primary open angle glaucoma 02/17/2015  . Bladder cancer ([place]) 11/17/2013  . Gross hematuria 11/17/2013  . Cataract, nuclear sclerotic senile 07/31/2011  . Dyslipidemia 06/09/2011  . PVC (premature ventricular contraction) 06/09/2011    Orientation RESPIRATION BLADDER Height & Weight     Self  Normal Incontinent Weight: 136 lb (61.689 kg) Height:  6\' 1"  (185.4 cm)  BEHAVIORAL SYMPTOMS/MOOD NEUROLOGICAL BOWEL NUTRITION STATUS      Incontinent Diet (Dys 1, Honey Thick Liquids)  AMBULATORY STATUS COMMUNICATION OF NEEDS Skin   Limited Assist Verbally Normal                       Personal Care Assistance Level of Assistance  Bathing, Feeding, Dressing Bathing Assistance: Limited  assistance Feeding assistance: Limited assistance Dressing Assistance: Limited assistance     Functional Limitations Info  Sight, Hearing, Speech Sight Info: Impaired Hearing Info: Impaired Speech Info: Impaired    SPECIAL CARE FACTORS FREQUENCY  PT (By licensed PT), OT (By licensed OT), Speech therapy     PT Frequency: 5 OT Frequency: 5     Speech Therapy Frequency: 5      Contractures      Additional Factors Info  Code Status, Allergies, Psychotropic Code Status Info: DNR Allergies Info: No known allergies Psychotropic Info: Medication         Current Medications (01/10/2016):  This is the current hospital active medication list Current Facility-Administered Medications  Medication Dose Route Frequency Provider Last Rate Last Dose  . acetaminophen (TYLENOL) tablet 650 mg  650 mg Oral Q6H PRN Bettey Costa, MD   650 mg at 01/09/16 0234   Or  . acetaminophen (TYLENOL) suppository 650 mg  650 mg Rectal Q6H PRN Bettey Costa, MD      . aspirin tablet 325 mg  325 mg Oral Daily Theodoro Grist, MD   325 mg at 01/09/16 0825  . atorvastatin (LIPITOR) tablet 40 mg  40 mg Oral q1800 Theodoro Grist, MD   40 mg at 01/09/16 1719  . brinzolamide (AZOPT) 1 % ophthalmic suspension 1 drop  1 drop Both Eyes BID Bettey Costa, MD   1 drop at 01/10/16 1000  . cholestyramine (QUESTRAN) packet 4 g  4 g Oral BID Bettey Costa, MD   4 g at  01/09/16 2118  . dextrose 5 % and 0.45 % NaCl with KCl 20 mEq/L infusion   Intravenous Continuous Theodoro Grist, MD 50 mL/hr at 01/10/16 [phone]    . donepezil (ARICEPT) tablet 10 mg  10 mg Oral QHS Bettey Costa, MD   10 mg at 01/09/16 2118  . dorzolamide-timolol (COSOPT) 22.3-6.8 MG/ML ophthalmic solution 1 drop  1 drop Left Eye BID Bettey Costa, MD   1 drop at 01/10/16 1000  . enoxaparin (LOVENOX) injection 40 mg  40 mg Subcutaneous Q24H Sital Mody, MD   40 mg at 01/09/16 2118  . haloperidol lactate (HALDOL) injection 1 mg  1 mg Intravenous Q6H PRN Theodoro Grist, MD   1 mg at  01/09/16 1716   Or  . haloperidol (HALDOL) tablet 1 mg  1 mg Oral Q6H PRN Theodoro Grist, MD   1 mg at 01/10/16 0209  . hydrALAZINE (APRESOLINE) injection 10 mg  10 mg Intravenous Q6H PRN Sylvan Cheese, MD      . latanoprost (XALATAN) 0.005 % ophthalmic solution 1 drop  1 drop Both Eyes QHS Bettey Costa, MD   1 drop at 01/09/16 2120  . ondansetron (ZOFRAN) tablet 4 mg  4 mg Oral Q6H PRN Bettey Costa, MD       Or  . ondansetron (ZOFRAN) injection 4 mg  4 mg Intravenous Q6H PRN Bettey Costa, MD      . sertraline (ZOLOFT) tablet 25 mg  25 mg Oral Daily Bettey Costa, MD   25 mg at 01/08/16 2242  . sodium chloride flush (NS) 0.9 % injection 3 mL  3 mL Intravenous Q12H Bettey Costa, MD   3 mL at 01/10/16 0100     Discharge Medications: Please see discharge summary for a list of discharge medications.  Relevant Imaging Results:  Relevant Lab Results:   Additional Information SSN:  [id number]  Loralyn Freshwater, LCSW

## 2016-01-10 NOTE — Progress Notes (Signed)
pts heartrate  Up high 130s. 137-139. Resting quietly in bed at present.  Dr Ether Griffins notified of this and will place order for med. Continue to [place].

## 2016-01-10 NOTE — Progress Notes (Signed)
   01/10/16 1050  Clinical Encounter Type  Visited With Patient and family together  Visit Type Initial  Referral From Other (Comment) (Speech Therapist)  Consult/Referral To Chaplain  Spiritual Encounters  Spiritual Needs Emotional;Prayer  Stress Factors  Patient Stress Factors Exhausted;Health changes  Family Stress Factors Exhausted;Major life changes  Met w/patient & sister. Patient scheduled for evaluation and major decisions must be made from the results. Provided compassionate presence and will follow up after eval is completed.  [place]. Dayzee Trower G. [place]

## 2016-01-10 NOTE — Evaluation (Signed)
Objective Swallowing Evaluation: Type of Study: Bedside Swallow Evaluation  Patient Details  Name: [name] MRN: [id number] Date of Birth: [birth year]  Today's Date: 01/10/2016 Time: SLP Start Time (ACUTE ONLY): 1100-SLP Stop Time (ACUTE ONLY): 1200 SLP Time Calculation (min) (ACUTE ONLY): 60 min  Past Medical History:  Past Medical History  Diagnosis Date  . Arrhythmia     pvcs  . Hyperlipidemia   . Cancer ([place])     bladder  . BPH (benign prostatic hyperplasia)   . Hypertension   . PONV (postoperative nausea and vomiting)   . Glaucoma   . Fracture of right talus     diagonosed 12/22/13- last f/u note on chart dated 01/02/14.    Past Surgical History:  Past Surgical History  Procedure Laterality Date  . Bladder surgery  4 or 5 years ago    "cut bladder cancer out"  . Tonsillectomy and adenoidectomy  as child  . Cystoscopy/retrograde/ureteroscopy Bilateral 11/17/2013    Procedure: CYSTOSCOPY, bilateral /RETROGRADE/ right URETEROSCOPY, right stent, bladder fulgeration;  Surgeon: Bernestine Amass, MD;  Location: [facility];  Service: Urology;  Laterality: Bilateral;  . Cystoscopy with retrograde pyelogram, ureteroscopy and stent placement Bilateral 01/12/2014    Procedure: CYSTOSCOPY WITH BILATERAL RETROGRADE PYELOGRAM/URETERAL STENT REMOVAL, URETEROSCOPY, LEFT  AND  BLADDER BIOPSY;  Surgeon: Bernestine Amass, MD;  Location: [facility];  Service: Urology;  Laterality: Bilateral;   HPI: Pt is a 82 y.o. male with a known history of Dementia and BPH who presents from [facility] with facial droop and left-sided weakness. Patient continues to have impairment in his speech and facial droop. He did not pass his swallow evaluation here. Symptoms were noticed by the nurse at [facility] this morning. Pt was just recently d/c'd from the hospital w/ dx of AKI and because of poor oral intake, patient can have recurrent renal failure(per MD note). Prior to that, pt was discharged from the hospital after treating  for diarrhea, severe protein energy malnutrition to an assisted living facility. Family members think that patient is depressed and not drinking enough. Pt is currently not eating but bites and sips; has been drowsy. Suspected Left sided weakness.   Subjective: pt lethargic, drowsy in bed. Awakened only briefly to mod- max verbal/tactile stim. Mumbled responses noted. Baseline dx of Dementia.   Subjective: Patient behavior: (alertness, ability to follow instructions, etc.): Patient awake but minimally responsive to others; highly distracted by internal and external distractors; does not follow directions  Chief complaint: clinical indicators of aspiration during CBSE 01/08/2016.   Objective:  Radiological Procedure: A videoflouroscopic evaluation of oral-preparatory, reflex initiation, and pharyngeal phases of the swallow was performed; as well as a screening of the upper esophageal phase.  I. POSTURE: Upright in MBS chair  II. VIEW: Lateral  III. COMPENSATORY STRATEGIES: N/A  IV. BOLUSES ADMINISTERED:    Thin Liquid: Deferred for patient safety   Nectar-thick Liquid: 2 teaspoon presentations   Honey-thick Liquid: 3 teaspoon presentations   Puree: 1 teaspoon presentation   Mechanical Soft: Deferred for patient safety  V. RESULTS OF EVALUATION: A. ORAL PREPARATORY PHASE: (The lips, tongue, and velum are observed for strength and coordination)       **Overall Severity Rating: Severe; >30 seconds oral stasis with initial (puree) bolus; 5 second, >15 seconds, and 5 second oral hold with honey-thick boluses. NTL: anterior loss vs. posterior transfer X1 and posterior loss into airway X1  B. SWALLOW INITIATION/REFLEX: (The reflex is normal if "triggered" by the time  the bolus reached the base of the tongue)  **Overall Severity Rating: Moderate; triggers while falling from the valleculae to the pyriform sinuses  C. PHARYNGEAL PHASE: (Pharyngeal function is normal if the bolus shows rapid,  smooth, and continuous transit through the pharynx and there is no pharyngeal residue after the swallow)  **Overall Severity Rating: Mild-moderate; decreased pharyngeal pressure generation with moderate and moderate-severe vallecular residue  D. LARYNGEAL PENETRATION: (Material entering into the laryngeal inlet/vestibule but not aspirated): None observed with thick consistencies (pureed solid and honey-thick liquid)    E. ASPIRATION: Before swallow with nectar-thick liquid  F. 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  There is no observed laryngeal penetration or aspiration of solid or honey-thick liquid.  With teaspoon presentation of nectar-thick liquid, patient demonstrates abnormal oral hold with anterior loss vs posterior transfer with one bolus and aspiration before swallow with second trial. This study supports a Dysphagia I diet with honey-thick liquid.  Unfortunately, the severity of oral phase deficits make it unlikely that the patient can meet his nutritional or hydration needs by mouth.    PLAN/RECOMMENDATIONS:   A. Diet: Dysphagia I with honey-thick liquid   B. Swallowing Precautions: Upright for all POs, encourage attention to swallowing, monitor for signs of aspiration   C. Recommended consultation to    D. Therapy recommendations: SLP will follow, but patient is not a candidate for direct dysphagia therapy   E. Results and recommendations were discussed with inpatient SLP   CHL IP CLINICAL IMPRESSIONS 01/08/2016  Therapy Diagnosis --  Clinical  Impression --  Impact on safety and function Moderate aspiration risk      CHL IP TREATMENT RECOMMENDATION 01/08/2016  Treatment Recommendations Therapy as outlined in treatment plan below     Prognosis 01/08/2016  Prognosis for Safe Diet Advancement Guarded  Barriers to Reach Goals Cognitive deficits;Severity of deficits  Barriers/Prognosis Comment --    CHL IP DIET RECOMMENDATION 11/26/2015  SLP Diet Recommendations --  Liquid Administration via --  Medication Administration --  Compensations Minimize environmental distractions;Slow rate;Small sips/bites  Postural Changes --      CHL IP OTHER RECOMMENDATIONS 01/08/2016  Recommended Consults --  Oral Care Recommendations --  Other Recommendations Order thickener from pharmacy;Prohibited food (jello, ice cream, thin soups);Remove water pitcher      CHL IP FOLLOW UP RECOMMENDATIONS 01/08/2016  Follow up Recommendations Skilled Nursing facility      [facility] IP FREQUENCY AND DURATION 01/08/2016  Speech Therapy Frequency (ACUTE ONLY) min 3x week  Treatment Duration 2 weeks           No flowsheet data found.  No flowsheet data found.   No flowsheet data found.  No flowsheet data found.   Leroy Sea, MS/CCC- SLP  Lou Miner 01/10/2016, 12:18 PM

## 2016-01-10 NOTE — Clinical Documentation Improvement (Signed)
Internal Medicine  Please update your documentation within the medical record to reflect your response to this query. Thank you  Can the diagnosis of Malnutrition be further specified?   Document Severity - Severe(third degree), Moderate (second degree), Mild (first degree)  Other condition  Unable to clinically determine  Document any associated diagnoses/conditions   Supporting Information: :  01/08/16 Nutrition note.Marland KitchenMarland Kitchen"Severe malnutrition in context of chronic illness"..."Body mass index is 18.98 kg/(m^2).".Marland KitchenMarland Kitchen [place]: See full eval 03/;18/17  Please exercise your independent, professional judgment when responding. A specific answer is not anticipated or expected.  Thank You, Ermelinda Das, RN, BSN, [place] Certified Clinical Documentation Specialist [place]: Health Information Management [phone]

## 2016-01-10 NOTE — Plan of Care (Signed)
Problem: Education: Goal: Knowledge of [facility] Education information/materials will improve Outcome: Not Progressing Pt unable to understand. Ed. To family  Problem: Education: Goal: Knowledge of disease or condition will improve Outcome: Not Progressing Education to family . Stroke  Booklet to family. Pt  Has 1:1 sitter at bedside. No fall

## 2016-01-10 NOTE — Plan of Care (Signed)
Problem: Education: Goal: Knowledge of [facility] Education information/materials will improve Outcome: Not Met (add Reason) Pt confused. Oriented to self. Unable to process knowledge related to [facility] general education nor stroke booklet given.   Problem: Education: Goal: Knowledge of disease or condition will improve Outcome: Not Met (add Reason) See previous note    Goal: Knowledge of secondary prevention will improve Outcome: Not Met (add Reason) Unable to process information due to advanced dementia Goal: Knowledge of patient specific risk factors addressed and post discharge goals established will improve Outcome: Not Met (add Reason) See previous note

## 2016-01-10 NOTE — Care Management Important Message (Signed)
Important Message  Patient Details  Name: [name] MRN: [id number] Date of Birth: [birth year]   Medicare Important Message Given:  Yes    Juliann Pulse A Darolyn Double 01/10/2016, 11:28 AM

## 2016-01-10 NOTE — Progress Notes (Signed)
[facility] [place] NAME: [name]    MR#:  [id number]  DATE OF BIRTH:  September 12, 1923  SUBJECTIVE:  CHIEF COMPLAINT:   Chief Complaint  Patient presents with  . Weakness   patient is 82 year old male with past medical history significant for the history of arrhythmias, hyperlipidemia, BPH, bladder cancer, who presents to the hospital with complaints of left-sided weakness, left facial droop. Patient's CT of head revealed acute right pontine and small right cortical MCA infarct. Echocardiogram was not done per family request, carotid ultrasound was unremarkable. The patient's LDL was found to be elevated at 135. No antiplatelet therapy prior to admission. The patient was evaluated by speech therapist and recommended most restricted diet, Dysphagia 1 diet with honey thick liquids, aspirin and Lipitor were initiated. Patient has been coughing with oral intake, uncomfortable, although remains nonverbal.  Not able to review systems   Review of Systems  Unable to perform ROS: mental acuity    VITAL SIGNS: Blood pressure 139/85, pulse 66, temperature 98.3 F (36.8 C), temperature source Axillary, resp. rate 20, height 6\' 1"  (1.854 m), weight 61.689 kg (136 lb), SpO2 95 %.  PHYSICAL EXAMINATION:   GENERAL:  82 y.o.-year-old patient lying in the bed , moderate respiratory distress, coughing, choking strangling while eating. , restless, uncomfortable, moves all extremities, withdraws, nonverbal, does not follow commands EYES: Pupils equal, round, reactive to light and accommodation. No scleral icterus. Extraocular muscles intact.  HEENT: Head atraumatic, normocephalic. Oropharynx and nasopharynx clear.  NECK:  Supple, no jugular venous distention. No thyroid enlargement, no tenderness.  LUNGS: Normal breath sounds bilaterally, no wheezing, rales,rhonchi or crepitation. No use of accessory muscles of respiration.  CARDIOVASCULAR: S1, S2 normal. No  murmurs, rubs, or gallops.  ABDOMEN: Soft, nontender, nondistended. Bowel sounds present. No organomegaly or mass.  EXTREMITIES: No pedal edema, cyanosis, or clubbing.  NEUROLOGIC: Cranial nerves revealed mild flattening of her left-sided nasal labial fissure. Muscle strength 2/5 in left-sided extremities. Sensation not able to assess. Gait not checked.  PSYCHIATRIC: The patient is alert, using right hand to feed himself, not able to assess orientation, not following commands  SKIN: No obvious rash, lesion, or ulcer.   ORDERS/RESULTS REVIEWED:   CBC  Recent Labs Lab 01/07/16 0906 01/09/16 0436  WBC 7.4  --   HGB 12.8* 13.6  HCT 37.2*  --   PLT 191  --   MCV 91.6  --   MCH 31.5  --   MCHC 34.3  --   RDW 14.9*  --   LYMPHSABS 0.9*  --   MONOABS 0.5  --   EOSABS 0.0  --   BASOSABS 0.0  --    ------------------------------------------------------------------------------------------------------------------  Chemistries   Recent Labs Lab 01/07/16 0906 01/08/16 0444 01/09/16 0436  NA 136 140 136  K 3.7 3.3* 3.7  CL 109 114* 110  CO2 21* 21* 21*  GLUCOSE 123* 91 112*  BUN 31* 24* 17  CREATININE 1.42* 1.20 1.15  CALCIUM 8.4* 8.3* 8.5*  AST 19  --   --   ALT 13*  --   --   ALKPHOS 51  --   --   BILITOT 1.1  --   --    ------------------------------------------------------------------------------------------------------------------ estimated creatinine clearance is 37.3 mL/min (by C-G formula based on Cr of 1.15). ------------------------------------------------------------------------------------------------------------------ No results for input(s): TSH, T4TOTAL, T3FREE, THYROIDAB in the last 72 hours.  Invalid input(s): FREET3  Cardiac Enzymes  Recent Labs Lab 01/07/16  [id number]  TROPONINI <0.03   ------------------------------------------------------------------------------------------------------------------ Invalid input(s):  POCBNP ---------------------------------------------------------------------------------------------------------------  RADIOLOGY: No results found.  EKG:  Orders placed or performed during the hospital encounter of 01/07/16  . ED EKG  . ED EKG  . EKG 12-Lead  . EKG 12-Lead    ASSESSMENT AND PLAN:  Active Problems:   Stroke (cerebrum) ([place])  #1. Acute stroke in right pontine and the right cortical MCA areas, continue aspirin , Lipitor orally, physical therapy, occupational therapy and speech therapy consults are appreciated, patient will be sent to skilled nursing facility, likely early next week . Modified barium swallow study was done today, dysphagia 1 diet with honey thick liquids was recommended, palliative care consultation is pending to discuss with patient's family dysphagia issues, risks of aspiration, etc. #2.  essential Hypertension, allowing permissive hypertension #3. Hyperlipidemia, continue Lipitor , follow LDL closely. Goal LDL less than 100, preferably less than 70 . #4. Dementia, supportive care  #5. History of diarrhea, resume Imodium or cholestyramine as needed, following closely, continue IV fluids for now while oral intake is unpredictable.    #6. Dysphagia, patient is on a dysphagia diet at present, palliative case discussed with family risks of aspiration Management plans discussed with the patient, family and they are in agreement.   DRUG ALLERGIES: No Known Allergies  CODE STATUS:     Code Status Orders        Start     Ordered   01/07/16 1226  Do not attempt resuscitation (DNR)   Continuous    Question Answer Comment  In the event of cardiac or respiratory ARREST Do not call a "code blue"   In the event of cardiac or respiratory ARREST Do not perform Intubation, CPR, defibrillation or ACLS   In the event of cardiac or respiratory ARREST Use medication by any route, position, wound care, and other measures to relive pain and suffering. May use oxygen,  suction and manual treatment of airway obstruction as needed for comfort.      01/07/16 1226    Code Status History    Date Active Date Inactive Code Status Order ID Comments User Context   12/01/2015  1:41 PM 12/01/2015  6:45 PM DNR [id number]  Colleen Can, MD Inpatient   11/25/2015  6:08 PM 12/01/2015  1:40 PM Full Code [id number]  Nicholes Mango, MD Inpatient   11/15/2015  9:02 PM 11/22/2015  8:44 PM Full Code [id number]  Fritzi Mandes, MD Inpatient    Advance Directive Documentation        Most Recent Value   Type of Advance Directive  Healthcare Power of Attorney, Living will   Pre-existing out of facility DNR order (yellow form or pink MOST form)     "MOST" Form in Place?        TOTAL TIME TAKING CARE OF THIS PATIENT: 35 minutes.  Discussed with speech therapy, Management, DC planning for discharge likely tomorrow to skilled nursing facility.   Theodoro Grist M.D on 01/10/2016 at 1:25 PM  Between 7am to 6pm - Pager - [phone]  After 6pm go to [email or web address] - password [email or web address] [facility]  [place] [facility]  Office  [phone]  CC: Primary care physician; [name], MD

## 2016-01-10 NOTE — Progress Notes (Signed)
Physical Therapy Treatment Patient Details Name: [name] MRN: [id number] DOB: [birth year] Today's Date: 01/10/2016    History of Present Illness Pt is a 82 y/o male here with L facial droop and L sided weakness.  He has baseline dementia and is very HOH,was admitted after [facility] staff note am had more L side weakness     PT Comments    Pt unable to respond to any questioning today. Sitter in room notes pt had a fall last night; bed lowered and floor mats in place. Pt shows mild agitation attempting to shift Bilateral lower extremities out of bed despite side rails up in place. Pt with increased assist today requiring Max A x 2 for bed mobility and transfer. Pt does not initiate any assist despite cues. Pt received up in chair and noted to appear comfortable with no agitation to move out of chair. Pt unable to follow any instruction for exercises. Family and sitter remain present in the room. Continue PT to progress transfers, bed mobility and strength to improve functional mobility.   Follow Up Recommendations  SNF     Equipment Recommendations  Rolling walker with 5" wheels    Recommendations for Other Services       Precautions / Restrictions Precautions Precautions: Fall (had fall 01/09/16) Restrictions Weight Bearing Restrictions: No    Mobility  Bed Mobility Overal bed mobility: Needs Assistance Bed Mobility: Supine to Sit     Supine to sit: Max assist     General bed mobility comments: Pt does not give any assist in attempt to sit. Once in sitting position pt does hold rail with RUE and maintains sitting balance with Min guard  Transfers Overall transfer level: Needs assistance Equipment used: None (2 person assist) Transfers: Sit to/from [facility] Sit to Stand: Max assist;+2 physical assistance Stand pivot transfers: Max assist;+2 physical assistance       General transfer comment: Does not attain full uprigh position; knees remain  partially flexed. Pt does not actively assist with transfer despite cues for stand  Ambulation/Gait             General Gait Details: unable to actively take steps with bed to chair. Max A x 2 stand pivot transfer   Stairs            Wheelchair Mobility    Modified Rankin (Stroke Patients Only)       Balance Overall balance assessment: Needs assistance Sitting-balance support: Single extremity supported;Feet supported Sitting balance-Leahy Scale: Fair Sitting balance - Comments: Min guard due to occasional slow retropulsion without Postural control: Posterior lean Standing balance support: During functional activity Standing balance-Leahy Scale: Zero Standing balance comment: Does not attain full upright stance, nor initiate effort to stand. Maximal A                    Cognition Arousal/Alertness: Awake/alert Behavior During Therapy: Flat affect Overall Cognitive Status: History of cognitive impairments - at baseline                      Exercises      General Comments        Pertinent Vitals/Pain Pain Assessment:  (does not respond to any questioning)    Home Living                      Prior Function            PT Goals (current  goals can now be found in the care plan section) Progress towards PT goals: Not progressing toward goals - comment    Frequency  7X/week    PT Plan Current plan remains appropriate    Co-evaluation             End of Session   Activity Tolerance: Other (comment) (limited cognitively, physical weakness, ) Patient left: in chair;with call bell/phone within reach;with chair alarm set;with family/visitor present;with nursing/sitter in room     Time: [id number] PT Time Calculation (min) (ACUTE ONLY): 16 min  Charges:  $Therapeutic Activity: 8-22 mins                    G CodesCharlaine Dalton, PTA 01/10/2016, 10:23 AM

## 2016-01-11 DIAGNOSIS — R1310 Dysphagia, unspecified: Secondary | ICD-10-CM

## 2016-01-11 DIAGNOSIS — D72829 Elevated white blood cell count, unspecified: Secondary | ICD-10-CM

## 2016-01-11 DIAGNOSIS — Z515 Encounter for palliative care: Secondary | ICD-10-CM

## 2016-01-11 DIAGNOSIS — R630 Anorexia: Secondary | ICD-10-CM

## 2016-01-11 DIAGNOSIS — N179 Acute kidney failure, unspecified: Secondary | ICD-10-CM

## 2016-01-11 DIAGNOSIS — Z8551 Personal history of malignant neoplasm of bladder: Secondary | ICD-10-CM

## 2016-01-11 DIAGNOSIS — F0280 Dementia in other diseases classified elsewhere without behavioral disturbance: Secondary | ICD-10-CM

## 2016-01-11 DIAGNOSIS — I639 Cerebral infarction, unspecified: Secondary | ICD-10-CM | POA: Insufficient documentation

## 2016-01-11 DIAGNOSIS — G309 Alzheimer's disease, unspecified: Secondary | ICD-10-CM

## 2016-01-11 LAB — URINALYSIS COMPLETE WITH MICROSCOPIC (ARMC ONLY)
BACTERIA UA: NONE SEEN
Bilirubin Urine: NEGATIVE
Glucose, UA: 50 mg/dL — AB
Ketones, ur: NEGATIVE mg/dL
Leukocytes, UA: NEGATIVE
Nitrite: NEGATIVE
PROTEIN: 100 mg/dL — AB
SQUAMOUS EPITHELIAL / LPF: NONE SEEN
Specific Gravity, Urine: 1.015 (ref 1.005–1.030)
WBC, UA: NONE SEEN WBC/hpf (ref 0–5)
pH: 6 (ref 5.0–8.0)

## 2016-01-11 LAB — COMPREHENSIVE METABOLIC PANEL
ALBUMIN: 3 g/dL — AB (ref 3.5–5.0)
ALK PHOS: 53 U/L (ref 38–126)
ALT: 14 U/L — ABNORMAL LOW (ref 17–63)
AST: 17 U/L (ref 15–41)
Anion gap: 8 (ref 5–15)
BILIRUBIN TOTAL: 1.7 mg/dL — AB (ref 0.3–1.2)
BUN: 22 mg/dL — ABNORMAL HIGH (ref 6–20)
CALCIUM: 8.4 mg/dL — AB (ref 8.9–10.3)
CO2: 18 mmol/L — ABNORMAL LOW (ref 22–32)
CREATININE: 1.26 mg/dL — AB (ref 0.61–1.24)
Chloride: 107 mmol/L (ref 101–111)
GFR calc Af Amer: 56 mL/min — ABNORMAL LOW (ref >60)
GFR calc non Af Amer: 48 mL/min — ABNORMAL LOW (ref >60)
Glucose, Bld: 132 mg/dL — ABNORMAL HIGH (ref 65–99)
POTASSIUM: 3.9 mmol/L (ref 3.5–5.1)
Sodium: 133 mmol/L — ABNORMAL LOW (ref 135–145)
TOTAL PROTEIN: 6.2 g/dL — AB (ref 6.5–8.1)

## 2016-01-11 LAB — CBC
HEMATOCRIT: 39.7 % — AB (ref 40.0–52.0)
HEMOGLOBIN: 13.8 g/dL (ref 13.0–18.0)
MCH: 31.6 pg (ref 26.0–34.0)
MCHC: 34.8 g/dL (ref 32.0–36.0)
MCV: 90.8 fL (ref 80.0–100.0)
Platelets: 187 10*3/uL (ref 150–440)
RBC: 4.37 MIL/uL — ABNORMAL LOW (ref 4.40–5.90)
RDW: 14.6 % — AB (ref 11.5–14.5)
WBC: 19.4 10*3/uL — AB (ref 3.8–10.6)

## 2016-01-11 MED ORDER — PROCHLORPERAZINE 25 MG RE SUPP
25.0000 mg | Freq: Three times a day (TID) | RECTAL | Status: DC | PRN
  Filled 2016-01-11: qty 1

## 2016-01-11 MED ORDER — LORAZEPAM 2 MG/ML IJ SOLN: 0.5000 mg | INTRAMUSCULAR | Status: DC | PRN

## 2016-01-11 MED ORDER — LORAZEPAM 0.5 MG PO TABS: 0.5000 mg | ORAL_TABLET | ORAL | Status: AC | PRN

## 2016-01-11 MED ORDER — HALOPERIDOL 1 MG PO TABS: 1.0000 mg | ORAL_TABLET | Freq: Four times a day (QID) | ORAL | Status: AC | PRN

## 2016-01-11 MED ORDER — BISACODYL 10 MG RE SUPP: 10.0000 mg | Freq: Every day | RECTAL | Status: DC | PRN

## 2016-01-11 MED ORDER — HALOPERIDOL LACTATE 5 MG/ML IJ SOLN
1.0000 mg | Freq: Two times a day (BID) | INTRAMUSCULAR | Status: DC
Start: 2016-01-11 — End: 2016-01-11

## 2016-01-11 MED ORDER — POLYVINYL ALCOHOL 1.4 % OP SOLN: 1.0000 [drp] | OPHTHALMIC | Status: AC | PRN

## 2016-01-11 MED ORDER — PROCHLORPERAZINE 25 MG RE SUPP: 25.0000 mg | Freq: Three times a day (TID) | RECTAL | Status: AC | PRN

## 2016-01-11 MED ORDER — LORAZEPAM 0.5 MG PO TABS: 0.5000 mg | ORAL_TABLET | ORAL | Status: DC | PRN

## 2016-01-11 MED ORDER — MORPHINE SULFATE (CONCENTRATE) 10 MG/0.5ML PO SOLN: 5.0000 mg | ORAL | Status: DC | PRN

## 2016-01-11 MED ORDER — MORPHINE SULFATE (CONCENTRATE) 10 MG/0.5ML PO SOLN: 5.0000 mg | ORAL | Status: AC | PRN

## 2016-01-11 MED ORDER — ACETAMINOPHEN 325 MG PO TABS: 650.0000 mg | ORAL_TABLET | Freq: Four times a day (QID) | ORAL | Status: AC | PRN

## 2016-01-11 MED ORDER — POLYVINYL ALCOHOL 1.4 % OP SOLN
1.0000 [drp] | OPHTHALMIC | Status: DC | PRN
  Filled 2016-01-11: qty 15

## 2016-01-11 MED ORDER — BISACODYL 10 MG RE SUPP
10.0000 mg | Freq: Every day | RECTAL | Status: AC | PRN
Start: 2016-01-11 — End: ?

## 2016-01-11 NOTE — Progress Notes (Signed)
[facility] [place] NAME: [name]    MR#:  [id number]  DATE OF BIRTH:  1923-04-19  SUBJECTIVE:  CHIEF COMPLAINT:   Chief Complaint  Patient presents with  . Weakness   patient is 82 year old male with past medical history significant for the history of arrhythmias, hyperlipidemia, BPH, bladder cancer, who presents to the hospital with complaints of left-sided weakness, left facial droop. Patient's CT of head revealed acute right pontine and small right cortical MCA infarct. Echocardiogram was not done per family request, carotid ultrasound was unremarkable. The patient's LDL was found to be elevated at 135. No antiplatelet therapy prior to admission. The patient was evaluated by speech therapist and recommended most restricted diet, Dysphagia 1 diet with honey thick liquids, aspirin and Lipitor were initiated. Patient was coughing with oral intake underwent a modified barium swallowing study and the same diet, dysphagia 1 with honey thick was recommended with precautions.  Not able to review systems   Review of Systems  Unable to perform ROS: mental acuity    VITAL SIGNS: Blood pressure 114/81, pulse 69, temperature 97.5 F (36.4 C), temperature source Axillary, resp. rate 18, height 6\' 1"  (1.854 m), weight 61.689 kg (136 lb), SpO2 96 %.  PHYSICAL EXAMINATION:   GENERAL:  82 y.o.-year-old patient lying in the bed , sleepy, nonverbal, not following commands , but more verbal whenever stimulated by pain or shaking ,  minimal movements in left upper extremity  EYES: Pupils equal, round, reactive to light and accommodation. No scleral icterus. Extraocular muscles intact.  HEENT: Head atraumatic, normocephalic. Oropharynx and nasopharynx clear.  NECK:  Supple, no jugular venous distention. No thyroid enlargement, no tenderness.  LUNGS: Normal breath sounds bilaterally, no wheezing, rales,rhonchi or crepitation. No use of accessory muscles  of respiration.  CARDIOVASCULAR: S1, S2 normal. No murmurs, rubs, or gallops.  ABDOMEN: uncomfortable with palpation, nondistended . Bowel sounds present. No organomegaly or mass.  EXTREMITIES: No pedal edema, cyanosis, or clubbing.  NEUROLOGIC: Cranial nerves revealed mild flattening of her left-sided nasal labial fissure. Muscle strength 1/5 in left-sided extremities. Sensation not able to assess. Gait not checked.  very uncomfortable when touched. Left upper extremity is, does not withdraw at present  PSYCHIATRIC: The patient is alert, using right hand to feed himself, not able to assess orientation, not following commands  SKIN: No obvious rash, lesion, or ulcer.   ORDERS/RESULTS REVIEWED:   CBC  Recent Labs Lab 01/07/16 0906 01/09/16 0436 01/11/16 1018  WBC 7.4  --  19.4*  HGB 12.8* 13.6 13.8  HCT 37.2*  --  39.7*  PLT 191  --  187  MCV 91.6  --  90.8  MCH 31.5  --  31.6  MCHC 34.3  --  34.8  RDW 14.9*  --  14.6*  LYMPHSABS 0.9*  --   --   MONOABS 0.5  --   --   EOSABS 0.0  --   --   BASOSABS 0.0  --   --    ------------------------------------------------------------------------------------------------------------------  Chemistries   Recent Labs Lab 01/07/16 0906 01/08/16 0444 01/09/16 0436 01/11/16 1018  NA 136 140 136 133*  K 3.7 3.3* 3.7 3.9  CL 109 114* 110 107  CO2 21* 21* 21* 18*  GLUCOSE 123* 91 112* 132*  BUN 31* 24* 17 22*  CREATININE 1.42* 1.20 1.15 1.26*  CALCIUM 8.4* 8.3* 8.5* 8.4*  AST 19  --   --  17  ALT 13*  --   --  14*  ALKPHOS 51  --   --  53  BILITOT 1.1  --   --  1.7*   ------------------------------------------------------------------------------------------------------------------ estimated creatinine clearance is 34 mL/min (by C-G formula based on Cr of 1.26). ------------------------------------------------------------------------------------------------------------------ No results for input(s): TSH, T4TOTAL, T3FREE, THYROIDAB  in the last 72 hours.  Invalid input(s): FREET3  Cardiac Enzymes  Recent Labs Lab 01/07/16 0906  TROPONINI <0.03   ------------------------------------------------------------------------------------------------------------------ Invalid input(s): POCBNP ---------------------------------------------------------------------------------------------------------------  RADIOLOGY: No results found.  EKG:  Orders placed or performed during the hospital encounter of 01/07/16  . ED EKG  . ED EKG  . EKG 12-Lead  . EKG 12-Lead    ASSESSMENT AND PLAN:  Active Problems:   Stroke (cerebrum) ([place])  #1. Acute stroke in right pontine and the right cortical MCA areas, continue aspirin , Lipitor orally, physical therapy, occupational therapy and speech therapy consults are appreciated, patient will be sent to skilled nursing facility, likely early  this week . Modified barium swallow study was done today, dysphagia 1 diet with honey thick liquids was recommended, palliative care consultation is pending to discuss with patient's family dysphagia issues, risks of aspiration, etc. the patient is an intermittently restless, trying to climb out of bed and even has fallen down, Haldol when necessary was ordered with no significant improvement. Patient will be initiated on Haldol around the clock twice a day, sitter will be discontinued, as patient's mental status improves/stabilizes  #2.  essential Hypertension, allowing permissive hypertension #3. Hyperlipidemia, continue Lipitor , follow LDL closely. Goal LDL less than 100, preferably less than 70 . #4. Dementia, supportive care. Patient required a sitter over the past 24 hours due to impulsivity, trying to get out of bed. Patient will be initiated on Haldol twice daily around the clock with as needed dosing, we will discontinue sitter. As soon as patient's mental status allows.   #5. History of diarrhea, resume Imodium or cholestyramine as needed,  following closely, continue IV fluids for now while oral intake is unpredictable.    #6. Dysphagia, patient is on a dysphagia diet at present, palliative case discussed with family risks of aspiration, dehydration, deterioration , palliative care, including comfort care  Management plans discussed with the patient, family and they are in agreement.   DRUG ALLERGIES: No Known Allergies  CODE STATUS:     Code Status Orders        Start     Ordered   01/07/16 1226  Do not attempt resuscitation (DNR)   Continuous    Question Answer Comment  In the event of cardiac or respiratory ARREST Do not call a "code blue"   In the event of cardiac or respiratory ARREST Do not perform Intubation, CPR, defibrillation or ACLS   In the event of cardiac or respiratory ARREST Use medication by any route, position, wound care, and other measures to relive pain and suffering. May use oxygen, suction and manual treatment of airway obstruction as needed for comfort.      01/07/16 1226    Code Status History    Date Active Date Inactive Code Status Order ID Comments User Context   12/01/2015  1:41 PM 12/01/2015  6:45 PM DNR [id number]  Colleen Can, MD Inpatient   11/25/2015  6:08 PM 12/01/2015  1:40 PM Full Code [id number]  Nicholes Mango, MD Inpatient   11/15/2015  9:02 PM 11/22/2015  8:44 PM Full Code [id number]  Fritzi Mandes, MD Inpatient    Advance Directive Documentation  Most Recent Value   Type of Advance Directive  Healthcare Power of Attorney, Living will   Pre-existing out of facility DNR order (yellow form or pink MOST form)     "MOST" Form in Place?        TOTAL TIME TAKING CARE OF THIS PATIENT: 35 minutes.    Theodoro Grist M.D on 01/11/2016 at 1:57 PM  Between 7am to 6pm - Pager - [phone]  After 6pm go to [email or web address] - password [email or web address] [facility]  [facility]  Office  [phone]  CC: Primary care physician; [name], MD

## 2016-01-11 NOTE — Consult Note (Signed)
Palliative Medicine Inpatient Consult Note   Name: [name] Date: 01/11/2016 MRN: [id number]  DOB: [birth year]  Referring Physician: Theodoro Grist, MD  Palliative Care consult requested for this 82 y.o. male for goals of medical therapy in patient with an acute CVA.  TODAY'S DISCUSSIONS AND DECISIONS: After reviewing pts chart and talking with nursing and several aides caring for pt and after examining pt, I feel he is actively dying --albeit slowly.  He just is not eating enough or drinking enough to stay alive.   I called his daughter. She recalls talking to me by phone in February when we talked about his dementia (which is present but never seems to make the problem list formally). We talked about code status at that time and that is when she elected to make him DNR status.    I spoke about his lethargy and his lack of appetite and lack of intake.  He had had a declining oral intake for several months --worsening recently.  He just is not interested in food.  And now, on a dysphagia 1 diet with honey-thick liquids, he very likely is not going to consume much at all and there is always the aspiration risk.  Daughter is advised that in my opinion, he seems highly likely to be someone who won't be with [place] in about 3 weeks time.  He could bounce back --but the probability of this happening is very slim.  Aide says all he had today was a few bites of applesauce and that was when the nurse came in to give him his meds in applesauce.  He has not accepted any other offers of food or liquids today and this is how its been with him lately.   She will come in this evening and we will talk in person about options to consider at this time.    BRIEF HISTORY:  Pt is a 82 yr old man who came in from a facility where he resides with left sided weakness and left facial drooping.  Ct of head showed acute right pontine and small right cortical MCA infacrt.  Echo was not done PER FAMILY REQUEST.  Carotid US  was unremarkable.   LDL was high at 135.  He was evaluated by SLP who has rec: dysphagia 1 diet with honey thick liquids. ASA and Lipitor were Rxd.  Pt was coughing with oral intake.  He underwent an MBSS and the same diet texture was recommended with precautions.  Pt is not 'bouncing back'.  He is not eating. Only scant intake is being accepted. He is very lethargic and has had minimal urine output.  Does not wake up to eat and when he is awake he is not interested in eating even when offered/ encouraged. He is very lethargic but also when awake, trying to climb out of bed in unsafe manner.     IMPRESSION Acute CVA Dysphagia due to acute CVA Alzheimers Dementia (not listed in problem list but I put this diagnosis into his record when pt was here in [facility] that note detailing info about his Alzheimer's Dementia) H/O bladder cancer ---treated surgically in 2015. HTN Glaucoma Hyperlipidemia BPH Recent hosp stay for acute kidney injury due to poor oral intake Severe malnutrition present on admission  --associated with loss of appetite associated with aging and worsened by current illness and dysphagia due to CVA Fairly recent hosp admission also for diarrhea and severe malnutrition.   Depression Poor oral intake Acute Renal Failure --likely due  to CVA and loss of appetite Leukocytosis  ---initial CXR on 3/17 and recent UA neg for infection signs     REVIEW OF SYSTEMS:  Patient is not able to provide ROS due to impairment of mental acuity due to CVA  SPIRITUAL SUPPORT SYSTEM: Yes.  SOCIAL HISTORY:  reports that he has never smoked. He has never used smokeless tobacco. He reports that he does not drink alcohol or use illicit drugs. His daughter, Lowell Bouton, is his legal guardian.   There are two more adult children.  Pt was living at [facility] (an assisted living facility/ community in [place])  LEGAL DOCUMENTS:  I placed a DNR form in the record as I did not see one though I  filled one out for him in February.   CODE STATUS: DNR  PAST MEDICAL HISTORY: Past Medical History  Diagnosis Date  . Arrhythmia     pvcs  . Hyperlipidemia   . Cancer ([place])     bladder  . BPH (benign prostatic hyperplasia)   . Hypertension   . PONV (postoperative nausea and vomiting)   . Glaucoma   . Fracture of right talus     diagonosed 12/22/13- last f/u note on chart dated 01/02/14.     PAST SURGICAL HISTORY:  Past Surgical History  Procedure Laterality Date  . Bladder surgery  4 or 5 years ago    "cut bladder cancer out"  . Tonsillectomy and adenoidectomy  as child  . Cystoscopy/retrograde/ureteroscopy Bilateral 11/17/2013    Procedure: CYSTOSCOPY, bilateral /RETROGRADE/ right URETEROSCOPY, right stent, bladder fulgeration;  Surgeon: Bernestine Amass, MD;  Location: [facility];  Service: Urology;  Laterality: Bilateral;  . Cystoscopy with retrograde pyelogram, ureteroscopy and stent placement Bilateral 01/12/2014    Procedure: CYSTOSCOPY WITH BILATERAL RETROGRADE PYELOGRAM/URETERAL STENT REMOVAL, URETEROSCOPY, LEFT  AND  BLADDER BIOPSY;  Surgeon: Bernestine Amass, MD;  Location: [facility];  Service: Urology;  Laterality: Bilateral;    ALLERGIES:  has No Known Allergies.  MEDICATIONS:  Current Facility-Administered Medications  Medication Dose Route Frequency Provider Last Rate Last Dose  . acetaminophen (TYLENOL) tablet 650 mg  650 mg Oral Q6H PRN Bettey Costa, MD   650 mg at 01/09/16 0234   Or  . acetaminophen (TYLENOL) suppository 650 mg  650 mg Rectal Q6H PRN Bettey Costa, MD      . aspirin tablet 325 mg  325 mg Oral Daily Theodoro Grist, MD   325 mg at 01/11/16 0816  . atorvastatin (LIPITOR) tablet 40 mg  40 mg Oral q1800 Theodoro Grist, MD   40 mg at 01/10/16 2113  . brinzolamide (AZOPT) 1 % ophthalmic suspension 1 drop  1 drop Both Eyes BID Bettey Costa, MD   1 drop at 01/11/16 0818  . cholestyramine (QUESTRAN) packet 4 g  4 g Oral BID Bettey Costa, MD   4 g at 01/11/16 0816  . dextrose  5 % and 0.45 % NaCl with KCl 20 mEq/L infusion   Intravenous Continuous Theodoro Grist, MD 50 mL/hr at 01/11/16 0705    . donepezil (ARICEPT) tablet 10 mg  10 mg Oral QHS Sital Mody, MD   10 mg at 01/10/16 2113  . dorzolamide-timolol (COSOPT) 22.3-6.8 MG/ML ophthalmic solution 1 drop  1 drop Left Eye BID Bettey Costa, MD   1 drop at 01/11/16 0817  . enoxaparin (LOVENOX) injection 40 mg  40 mg Subcutaneous Q24H Bettey Costa, MD   40 mg at 01/10/16 2113  . haloperidol lactate (HALDOL) injection  1 mg  1 mg Intravenous Q6H PRN Theodoro Grist, MD   1 mg at 01/09/16 1716   Or  . haloperidol (HALDOL) tablet 1 mg  1 mg Oral Q6H PRN Theodoro Grist, MD   1 mg at 01/10/16 0209  . haloperidol lactate (HALDOL) injection 1 mg  1 mg Intravenous BID Theodoro Grist, MD   1 mg at 01/11/16 1000  . hydrALAZINE (APRESOLINE) injection 10 mg  10 mg Intravenous Q6H PRN Sylvan Cheese, MD      . latanoprost (XALATAN) 0.005 % ophthalmic solution 1 drop  1 drop Both Eyes QHS Bettey Costa, MD   1 drop at 01/10/16 2113  . metoprolol tartrate (LOPRESSOR) tablet 25 mg  25 mg Oral BID Theodoro Grist, MD   25 mg at 01/11/16 0816  . ondansetron (ZOFRAN) tablet 4 mg  4 mg Oral Q6H PRN Bettey Costa, MD       Or  . ondansetron (ZOFRAN) injection 4 mg  4 mg Intravenous Q6H PRN Sital Mody, MD      . sertraline (ZOLOFT) tablet 25 mg  25 mg Oral Daily Bettey Costa, MD   25 mg at 01/11/16 0816  . sodium chloride flush (NS) 0.9 % injection 3 mL  3 mL Intravenous Q12H Bettey Costa, MD   3 mL at 01/11/16 0817    Vital Signs: BP 114/81 mmHg  Pulse 69  Temp(Src) 97.5 F (36.4 C) (Axillary)  Resp 18  Ht 6\' 1"  (1.854 m)  Wt 61.689 kg (136 lb)  SpO2 96% Filed Weights   01/07/16 0912  Weight: 61.689 kg (136 lb)    Estimated body mass index is 17.95 kg/(m^2) as calculated from the following:   Height as of this encounter: 6\' 1"  (1.854 m).   Weight as of this encounter: 61.689 kg (136 lb).  PERFORMANCE STATUS (ECOG) : 4 - Bedbound  PHYSICAL  EXAM:        LABS: CBC:    Component Value Date/Time   WBC 19.4* 01/11/2016 1018   WBC 5.4 06/29/2009 0948   HGB 13.8 01/11/2016 1018   HGB 15.1 06/29/2009 0948   HCT 39.7* 01/11/2016 1018   HCT 42.4 06/29/2009 0948   PLT 187 01/11/2016 1018   PLT 152 06/29/2009 0948   MCV 90.8 01/11/2016 1018   MCV 97.2 06/29/2009 0948   NEUTROABS 5.9 01/07/2016 0906   NEUTROABS 3.6 06/29/2009 0948   LYMPHSABS 0.9* 01/07/2016 0906   LYMPHSABS 1.0 06/29/2009 0948   MONOABS 0.5 01/07/2016 0906   MONOABS 0.5 06/29/2009 0948   EOSABS 0.0 01/07/2016 0906   EOSABS 0.2 06/29/2009 0948   BASOSABS 0.0 01/07/2016 0906   BASOSABS 0.0 06/29/2009 0948   Comprehensive Metabolic Panel:    Component Value Date/Time   NA 133* 01/11/2016 1018   K 3.9 01/11/2016 1018   CL 107 01/11/2016 1018   CO2 18* 01/11/2016 1018   BUN 22* 01/11/2016 1018   CREATININE 1.26* 01/11/2016 1018   CREATININE 4.51* 11/24/2015 1653   GLUCOSE 132* 01/11/2016 1018   CALCIUM 8.4* 01/11/2016 1018   AST 17 01/11/2016 1018   ALT 14* 01/11/2016 1018   ALKPHOS 53 01/11/2016 1018   BILITOT 1.7* 01/11/2016 1018   PROT 6.2* 01/11/2016 1018   ALBUMIN 3.0* 01/11/2016 1018     More than 50% of the visit was spent in counseling/coordination of care: Yes  Time Spent: 55  minutes

## 2016-01-11 NOTE — Progress Notes (Signed)
Nutrition Follow-up  DOCUMENTATION CODES:   Severe malnutrition in context of chronic illness  INTERVENTION:   Meals and Snacks: Cater to patient preferences on Dysphagia I, Honey thick liquids; SLP following Medical Food Supplement Therapy:  Continue as ordered Coordination of Care: will follow poc as palliative care consult pending    NUTRITION DIAGNOSIS:   Malnutrition related to chronic illness, dysphagia, lethargy/confusion as evidenced by severe depletion of muscle mass, moderate depletion of body fat, percent weight loss.  GOAL:   Patient will meet greater than or equal to 90% of their needs  MONITOR:   PO intake, Supplement acceptance, Labs, Weight trends, Skin, I & O's  REASON FOR ASSESSMENT:   Malnutrition Screening Tool    ASSESSMENT:    Pt lethargic, difficult to arouse this am. Pt s/p MBSS dysphagia I, honey thick liquids recommended. Palliative Care evaluation pending.  Diet Order:  DIET - DYS 1 Room service appropriate?: Yes; Fluid consistency:: Honey Thick    Current Nutrition: Pt has eaten 0% today secondary to lethargy. RN Ok Edwards reports arousing pt enough for am medications, but nothing more this afternoon. Ok Edwards RN also reports having the pt on this past Sunday to which pt ate >50% of breakfast Sunday morning and family assisted/fed pt throughout the rest of the day. Pt remains on pureeds with honey thick liquids.   Gastrointestinal Profile: Last BM: 01/10/2016   Scheduled Medications:  . aspirin  325 mg Oral Daily  . atorvastatin  40 mg Oral q1800  . brinzolamide  1 drop Both Eyes BID  . cholestyramine  4 g Oral BID  . donepezil  10 mg Oral QHS  . dorzolamide-timolol  1 drop Left Eye BID  . enoxaparin (LOVENOX) injection  40 mg Subcutaneous Q24H  . haloperidol lactate  1 mg Intravenous BID  . latanoprost  1 drop Both Eyes QHS  . metoprolol tartrate  25 mg Oral BID  . sertraline  25 mg Oral Daily  . sodium chloride flush  3 mL Intravenous Q12H     Continuous Medications:  . dextrose 5 % and 0.45 % NaCl with KCl 20 mEq/L 50 mL/hr at 01/11/16 0705     Electrolyte/Renal Profile and Glucose Profile:   Recent Labs Lab 01/08/16 0444 01/09/16 0436 01/11/16 1018  NA 140 136 133*  K 3.3* 3.7 3.9  CL 114* 110 107  CO2 21* 21* 18*  BUN 24* 17 22*  CREATININE 1.20 1.15 1.26*  CALCIUM 8.3* 8.5* 8.4*  GLUCOSE 91 112* 132*   Protein Profile:  Recent Labs Lab 01/07/16 0906 01/11/16 1018  ALBUMIN 3.5 3.0*     Weight Trend since Admission: Filed Weights   01/07/16 0912  Weight: 136 lb (61.689 kg)     Skin:  Reviewed, no issues   BMI:  Body mass index is 17.95 kg/(m^2).  Estimated Nutritional Needs:   Kcal:  1950-2340 kcals using IBW 78 kg  Protein:  78-94 g   Fluid:  1950-2340 mL   EDUCATION NEEDS:   Education needs no appropriate at this time  [place], RD, LDN Pager [phone] Weekend/On-Call Pager [phone]

## 2016-01-11 NOTE — Progress Notes (Signed)
Palliative Care Update  Pt is now comfort care status.  Additionally, pts daughter is requesting he go to [facility]. She was given information about Hospice care being possible at his facility --but she is asking for Hospice Home here in [place].  Her daughter, Tilda Burrow, is here, and Tilda Burrow is a friend of someone at [facility] (Lynetta Mare) --and they strongly want pt to go to [facility] and not elsewhere.   Pt will have the routine very low dose haldol DCd as he has calmed down. I was asked to DC the sitter order and have done so.  A PRN Haldol order is still in place.  Daughter says he has a 'lot of pain' --often in his abdomen (and over bladder "where he had his bladder cancer"). She wants to make sure we treat his pain.  Pt may perk up and eat more than we think --he did take some Magic Cup from aide this pm (family and I watched him accept 3 bites). But he is VERY slow to take this in and swallow it.  It won't be enough for him to stay alive.  His stroke has definitely affected him significantly and daughter knows he would never want a feeding tube etc.  She is legal guardian but says her two siblings would agree with this plan for Hospice Home.   See DC Med Rec orders already completed.  Will update attending in am, as it is late currently.    Colleen Can, MD

## 2016-01-11 NOTE — Progress Notes (Signed)
Daughter at bedside. Dr. Megan Salon paged. Madlyn Frankel, RN

## 2016-01-11 NOTE — Progress Notes (Signed)
PT Cancellation Note  Patient Details Name: [name] MRN: [id number] DOB: 03-01-1923   Cancelled Treatment:    Reason Eval/Treat Not Completed: Fatigue/lethargy limiting ability to participate;Other (comment). Treatment attempted this morning. Nursing assistant/sitter notes that pt has been up for the past 24 hours and quite agitated at times. Pt has finally fallen asleep and resting peacefully. Recommended to hold PT at this time and allow pt to rest. Re attempt treatment tomorrow.    Charlaine Dalton, [place] 01/11/2016, 11:16 AM

## 2016-01-11 NOTE — Clinical Social Work Note (Signed)
Clinical Social Work Assessment  Patient Details  Name: [name] MRN: [id number] Date of Birth: 1923-05-28  Date of referral:  01/11/16               Reason for consult:  Facility Placement                Permission sought to share information with:  Family Supports Permission granted to share information::  Yes, Verbal Permission Granted  Name::     [place], stepdaughter and guardian   Housing/Transportation Living arrangements for the past 2 months:  [place] of Information:  Guardian Patient Interpreter Needed:  None Criminal Activity/Legal Involvement Pertinent to Current Situation/Hospitalization:  No - Comment as needed Significant Relationships:  Adult Children Lives with:  Facility Resident Do you feel safe going back to the place where you live?  No (Pt will need a higher level of care. ) Need for family participation in patient care:  Yes (Comment)  Care giving concerns:  Pt may need a higher level of care after STR.   Social Worker assessment / plan:  CSW spoke with pt's stepdaughter, Opal Sidles, to address consult for SNF as recommended by PT. Pt was admitted from [facility], however per pt's stepdaughter, pt he may need a higher level of care. Pt's stepdaughter is agreeable to a SNF search and prefers [facility]. CSW sent out referral and will follow up with bed offers.   CSW was updated that a Palliative Care consult has been initiated. CSW will continue to follow.   Employment status:  Retired Forensic scientist:  Medicare PT Recommendations:  [place] / Referral to community resources:  [place]  Patient/Family's Response to care:  Pt's stepdaughter was appreciative of CSW support.  Patient/Family's Understanding of and Emotional Response to Diagnosis, Current Treatment, and Prognosis:  Pt's stepdaughter will be meeting with Palliative Care MD  Emotional Assessment Appearance:   Appears stated age Attitude/Demeanor/Rapport:  Unable to Assess Affect (typically observed):  Unable to Assess Orientation:  Fluctuating Orientation (Suspected and/or reported Sundowners) Alcohol / Substance use:  Never Used Psych involvement (Current and /or in the community):  No (Comment)  Discharge Needs  Concerns to be addressed:  Adjustment to Illness Readmission within the last 30 days:  Yes Current discharge risk:  Chronically ill Barriers to Discharge:  Continued Medical Work up   [employer], LCSW 01/11/2016, 4:06 PM

## 2016-01-12 LAB — URINE CULTURE: Culture: NO GROWTH

## 2016-01-12 NOTE — Progress Notes (Signed)
New hospice home referral received from [place] following a Palliative Care consult. Mr. Lumadue is a 82 year old man brought to [facility] ED on 3/17 for evaluation of slurred speech and left sided weakness, he was found on MRI to have an acute right pontine and right cortical MCA infarct. Family at that time chose to have no further interventions. He has continued with weakness, dysphasia and agitation, eating only bites and sips of honey thick liquids. Patient's daughter Opal Sidles met with Dr. Megan Salon, Palliative Medicine physician and has chosen for Mr. Upperman to be transferred to the hospice home for end of life care.  Patient seen lying in bed, eyes open, hard of hearing, appeared alert and restless. Calmed with voice and hand holding. Last PRN was haldol 1 mg po on 3/21 for agitation. Writer contacted Opal Sidles via phone at her work number to initiate education regarding hospice services, philosophy and team approach to care with good understanding voiced. Consents faxed to Opal Sidles at her work fax, signed and returned and faxed to referral with patient information. Report called to Hospice home, EMS notified for transport. Hospital care team all aware of and in agreement with transfer to the hospice home today via EMS with signed portable DNR in place. Thank you for the opportunity to be involved in the care of this patient. Flo Shanks RN, [facility] [place], hospital Liaison [phone] c

## 2016-01-12 NOTE — Progress Notes (Signed)
Palliative Medicine Inpatient Consult Follow Up Note   Name: [name] Date: 01/12/2016 MRN: [id number]  DOB: 29-Oct-1922  Referring Physician: Hillary Bow, MD  Palliative Care consult requested for this 82 y.o. male for goals of medical therapy in patient with an acute CVA on top of dementia.  TODAY'S PLAN: Pt is being set up to go to [facility]. I have done the DC med recs and RXs DNR form is completed and on paper chart. Family in agreement. Hospice Liaison has spoken with family and plans are in the works appropriately.   IMPRESSION:  Acute CVA Dysphagia due to acute CVA Alzheimers Dementia (not listed in problem list but I put this diagnosis into his record when pt was here in [facility] that note detailing info about his Alzheimer's Dementia) H/O bladder cancer ---treated surgically in 2015. HTN Glaucoma Hyperlipidemia BPH Recent hosp stay for acute kidney injury due to poor oral intake Severe malnutrition present on admission  --associated with loss of appetite associated with aging and worsened by current illness and dysphagia due to CVA Fairly recent hosp admission also for diarrhea and severe malnutrition.  Depression Poor oral intake Acute Renal Failure --likely due to CVA and loss of appetite Leukocytosis ---initial CXR on 3/17 and recent UA neg for infection signs  REVIEW OF SYSTEMS:  Patient is not able to provide ROS due to dementia and stroke  CODE STATUS: DNR   PAST MEDICAL HISTORY: Past Medical History  Diagnosis Date  . Arrhythmia     pvcs  . Hyperlipidemia   . Cancer ([place])     bladder  . BPH (benign prostatic hyperplasia)   . Hypertension   . PONV (postoperative nausea and vomiting)   . Glaucoma   . Fracture of right talus     diagonosed 12/22/13- last f/u note on chart dated 01/02/14.     PAST SURGICAL HISTORY:  Past Surgical History  Procedure Laterality Date  . Bladder surgery  4 or 5 years ago    "cut bladder cancer out"   . Tonsillectomy and adenoidectomy  as child  . Cystoscopy/retrograde/ureteroscopy Bilateral 11/17/2013    Procedure: CYSTOSCOPY, bilateral /RETROGRADE/ right URETEROSCOPY, right stent, bladder fulgeration;  Surgeon: Bernestine Amass, MD;  Location: [facility];  Service: Urology;  Laterality: Bilateral;  . Cystoscopy with retrograde pyelogram, ureteroscopy and stent placement Bilateral 01/12/2014    Procedure: CYSTOSCOPY WITH BILATERAL RETROGRADE PYELOGRAM/URETERAL STENT REMOVAL, URETEROSCOPY, LEFT  AND  BLADDER BIOPSY;  Surgeon: Bernestine Amass, MD;  Location: [facility];  Service: Urology;  Laterality: Bilateral;    Vital Signs: BP 127/86 mmHg  Pulse 80  Temp(Src) 98.1 F (36.7 C) (Axillary)  Resp 18  Ht 6\' 1"  (1.854 m)  Wt 61.689 kg (136 lb)  SpO2 95% Filed Weights   01/07/16 0912  Weight: 61.689 kg (136 lb)    Estimated body mass index is 17.95 kg/(m^2) as calculated from the following:   Height as of this encounter: 6\' 1"  (1.854 m).   Weight as of this encounter: 61.689 kg (136 lb).  PHYSICAL EXAM: NAD Resting in medical bed--feet a bit off of bed Eyes closed No JVD or TM Hrt rrr no m Lungs decreaed BS bases Abd soft and NT Ext no mottling or cyanosis VERY lethargic  LABS: CBC:    Component Value Date/Time   WBC 19.4* 01/11/2016 1018   WBC 5.4 06/29/2009 0948   HGB 13.8 01/11/2016 1018   HGB 15.1 06/29/2009 0948   HCT 39.7* 01/11/2016 1018  HCT 42.4 06/29/2009 0948   PLT 187 01/11/2016 1018   PLT 152 06/29/2009 0948   MCV 90.8 01/11/2016 1018   MCV 97.2 06/29/2009 0948   NEUTROABS 5.9 01/07/2016 0906   NEUTROABS 3.6 06/29/2009 0948   LYMPHSABS 0.9* 01/07/2016 0906   LYMPHSABS 1.0 06/29/2009 0948   MONOABS 0.5 01/07/2016 0906   MONOABS 0.5 06/29/2009 0948   EOSABS 0.0 01/07/2016 0906   EOSABS 0.2 06/29/2009 0948   BASOSABS 0.0 01/07/2016 0906   BASOSABS 0.0 06/29/2009 0948   Comprehensive Metabolic Panel:    Component Value Date/Time   NA 133* 01/11/2016 1018    K 3.9 01/11/2016 1018   CL 107 01/11/2016 1018   CO2 18* 01/11/2016 1018   BUN 22* 01/11/2016 1018   CREATININE 1.26* 01/11/2016 1018   CREATININE 4.51* 11/24/2015 1653   GLUCOSE 132* 01/11/2016 1018   CALCIUM 8.4* 01/11/2016 1018   AST 17 01/11/2016 1018   ALT 14* 01/11/2016 1018   ALKPHOS 53 01/11/2016 1018   BILITOT 1.7* 01/11/2016 1018   PROT 6.2* 01/11/2016 1018   ALBUMIN 3.0* 01/11/2016 1018     More than 50% of the visit was spent in counseling/coordination of care: YES  Time Spent:  15 min

## 2016-01-12 NOTE — Progress Notes (Signed)
Patient had no c/o pain this shift, patient discharged to Hospice via EMS in stretcher personal belongings sent with patient patient had glasses on

## 2016-01-12 NOTE — Discharge Summary (Signed)
[place] [facility] [place] NAME: [name]    MR#:  [id number]  DATE OF BIRTH:  [birth year]  DATE OF ADMISSION:  01/07/2016 ADMITTING PHYSICIAN: Bettey Costa, MD  DATE OF DISCHARGE: No discharge date for patient encounter.  PRIMARY CARE PHYSICIAN: [name], MD   ADMISSION DIAGNOSIS:  Speech disturbance [R47.9] Weakness [R53.1] Facial droop [R29.810]  DISCHARGE DIAGNOSIS:  Active Problems:   Stroke (cerebrum) (HCC)   CVA (cerebral infarction)   SECONDARY DIAGNOSIS:   Past Medical History  Diagnosis Date  . Arrhythmia     pvcs  . Hyperlipidemia   . Cancer ([place])     bladder  . BPH (benign prostatic hyperplasia)   . Hypertension   . PONV (postoperative nausea and vomiting)   . Glaucoma   . Fracture of right talus     diagonosed 12/22/13- last f/u note on chart dated 01/02/14.      ADMITTING HISTORY  [name] is a 82 y.o. male with a known history of Dementia and BPH who presents from [facility] with facial droop and left-sided weakness. Patient continues to have impairment in his speech and facial droop. He did not pass his swallow evaluation here. Symptoms were noticed by the nurse at [facility] this morning. He was last seen without these changes yesterday afternoon. Due to this he is not candidate for TPA.  HOSPITAL COURSE:   #1. Acute stroke in right pontine and the right cortical MCA areas #2. Essential Hypertension #3. Hyperlipidemia #4. Dementia #6. Dysphagia  Due to no improvement with the stroke and severe dysphagia and worsening mental status, palliative care Dr. Megan Salon discussed with family and decision has been made to transfer patient to hospice home for end-of-life care.  CONSULTS OBTAINED:  Treatment Team:  Alexis Goodell, MD  DRUG ALLERGIES:  No Known Allergies  DISCHARGE MEDICATIONS:   Current Discharge Medication List    START taking these medications   Details  bisacodyl  (DULCOLAX) 10 MG suppository Place 1 suppository (10 mg total) rectally daily as needed for moderate constipation. Qty: 6 suppository, Refills: 0    haloperidol (HALDOL) 1 MG tablet Take 1 tablet (1 mg total) by mouth every 6 (six) hours as needed for agitation. Qty: 10 tablet, Refills: 0    LORazepam (ATIVAN) 0.5 MG tablet Take 1 tablet (0.5 mg total) by mouth every 4 (four) hours as needed for anxiety. Qty: 15 tablet, Refills: 0    Morphine Sulfate (MORPHINE CONCENTRATE) 10 MG/0.5ML SOLN concentrated solution Take 0.25 mLs (5 mg total) by mouth every hour as needed for moderate pain, severe pain or shortness of breath. Qty: 30 mL, Refills: 0    polyvinyl alcohol (LIQUIFILM TEARS) 1.4 % ophthalmic solution Place 1 drop into both eyes every 2 (two) hours as needed for dry eyes. Qty: 15 mL, Refills: 0    prochlorperazine (COMPAZINE) 25 MG suppository Place 1 suppository (25 mg total) rectally every 8 (eight) hours as needed for nausea or vomiting. Qty: 6 suppository, Refills: 0      CONTINUE these medications which have CHANGED   Details  acetaminophen (TYLENOL) 325 MG tablet Take 2 tablets (650 mg total) by mouth every 6 (six) hours as needed for mild pain or fever.      STOP taking these medications     brinzolamide (AZOPT) 1 % ophthalmic suspension      cholestyramine (QUESTRAN) 4 g packet      ciprofloxacin (CIPRO) 250 MG tablet  diltiazem (CARDIZEM CD) 120 MG 24 hr capsule      donepezil (ARICEPT) 10 MG tablet      dorzolamide-timolol (COSOPT) 22.3-6.8 MG/ML ophthalmic solution      latanoprost (XALATAN) 0.005 % ophthalmic solution      loperamide (IMODIUM) 2 MG capsule      sertraline (ZOLOFT) 50 MG tablet         Today   VITAL SIGNS:  Blood pressure 127/86, pulse 80, temperature 98.1 F (36.7 C), temperature source Axillary, resp. rate 18, height 6\' 1"  (1.854 m), weight 61.689 kg (136 lb), SpO2 95 %.  I/O:   Intake/Output Summary (Last 24 hours) at  01/12/16 1105 Last data filed at 01/11/16 1835  Gross per 24 hour  Intake    695 ml  Output     50 ml  Net    645 ml    PHYSICAL EXAMINATION:  Physical Exam  GENERAL:  82 y.o.-year-old patient lying in the bed with no acute distress.  [place]  DATA REVIEW:   CBC  Recent Labs Lab 01/11/16 1018  WBC 19.4*  HGB 13.8  HCT 39.7*  PLT 187    Chemistries   Recent Labs Lab 01/11/16 1018  NA 133*  K 3.9  CL 107  CO2 18*  GLUCOSE 132*  BUN 22*  CREATININE 1.26*  CALCIUM 8.4*  AST 17  ALT 14*  ALKPHOS 53  BILITOT 1.7*    Cardiac Enzymes  Recent Labs Lab 01/07/16 0906  TROPONINI <0.03    Microbiology Results  Results for orders placed or performed during the hospital encounter of 01/07/16  MRSA PCR Screening     Status: None   Collection Time: 01/07/16  6:00 PM  Result Value Ref Range Status   MRSA by PCR NEGATIVE NEGATIVE Final    Comment:        The GeneXpert MRSA Assay (FDA approved for NASAL specimens only), is one component of a comprehensive MRSA colonization surveillance program. It is not intended to diagnose MRSA infection nor to guide or monitor treatment for MRSA infections.   Urine culture     Status: None   Collection Time: 01/08/16 10:23 AM  Result Value Ref Range Status   Specimen Description URINE, CATHETERIZED  Final   Special Requests Normal  Final   Culture MULTIPLE SPECIES PRESENT, SUGGEST RECOLLECTION  Final   Report Status 01/10/2016 FINAL  Final  Urine culture     Status: None   Collection Time: 01/11/16  8:30 AM  Result Value Ref Range Status   Specimen Description URINE, CATHETERIZED  Final   Special Requests NONE  Final   Culture NO GROWTH 1 DAY  Final   Report Status 01/12/2016 FINAL  Final    RADIOLOGY:  No results found.  Follow up with PCP in 1 week.  Management plans discussed with the patient, family and they are in agreement.  CODE STATUS:     Code Status Orders        Start     Ordered    01/07/16 1226  Do not attempt resuscitation (DNR)   Continuous    Question Answer Comment  In the event of cardiac or respiratory ARREST Do not call a "code blue"   In the event of cardiac or respiratory ARREST Do not perform Intubation, CPR, defibrillation or ACLS   In the event of cardiac or respiratory ARREST Use medication by any route, position, wound care, and other measures to relive pain and suffering. May use  oxygen, suction and manual treatment of airway obstruction as needed for comfort.      01/07/16 1226    Code Status History    Date Active Date Inactive Code Status Order ID Comments User Context   12/01/2015  1:41 PM 12/01/2015  6:45 PM DNR [id number]  Colleen Can, MD Inpatient   11/25/2015  6:08 PM 12/01/2015  1:40 PM Full Code [id number]  Nicholes Mango, MD Inpatient   11/15/2015  9:02 PM 11/22/2015  8:44 PM Full Code [id number]  Fritzi Mandes, MD Inpatient    Advance Directive Documentation        Most Recent Value   Type of Advance Directive  Healthcare Power of Attorney, Living will   Pre-existing out of facility DNR order (yellow form or pink MOST form)     "MOST" Form in Place?        TOTAL TIME TAKING CARE OF THIS PATIENT ON DAY OF DISCHARGE: more than 30 minutes.   Hillary Bow R M.D on 01/12/2016 at 11:05 AM  Between 7am to 6pm - Pager - [phone]  After 6pm go to [email or web address] - password [email or web address] [place] [facility]  Office  [phone]  CC: Primary care physician; [name], MD  Note: This dictation was prepared with Dragon dictation along with smaller phrase technology. Any transcriptional errors that result from this process are unintentional.

## 2016-01-12 NOTE — Care Management Important Message (Signed)
Important Message  Patient Details  Name: [name] MRN: [id number] Date of Birth: 1923/01/08   Medicare Important Message Given:  Yes    Juliann Pulse A Jashae Wiggs 01/12/2016, 11:00 AM

## 2016-01-12 NOTE — Progress Notes (Signed)
Pt lethargic and slept all night without incident. Safety maintained with low bed, bed alarm and fall mat. Dorna Bloom RN

## 2016-01-12 NOTE — Discharge Instructions (Signed)
Further care at hospice home

## 2016-01-12 NOTE — Clinical Social Work Note (Signed)
Pt is ready for discharge today. Pt will go to [facility] today. Hospice Liaison made arrangements for transfer. CSW spoke with pt's stepdaughter, Opal Sidles, who is in agreement with discharge plan. CSW provided supportive counseling around discharge plan. [facility] EMS will provide transportation. CSW is signing off as no further needs identified.   Darden Dates, MSW, LCSW Clinical Social Worker  [phone]

## 2016-01-19 ENCOUNTER — Telehealth: Payer: Self-pay | Admitting: Family Medicine

## 2016-01-19 NOTE — Telephone Encounter (Signed)
Butch Penny [id number] called from Hospice home in [place] regarding pt passed away 15:15 on Feb 01, 2016 at [facility].  Thank you!

## 2016-01-22 DEATH — deceased

## 2017-05-11 IMAGING — CT CT HEAD W/O CM
3 series · 17 of 30 positions shown, 19 images · non-contrast
Comparison: Report of head CT 08/06/1997 (no images available).

CLINICAL DATA: [AGE] male who fell from standing. Right
facial injury. Initial encounter.

EXAM:
CT HEAD WITHOUT CONTRAST
TECHNIQUE: Contiguous axial images were obtained from the base of the skull
through the vertex without intravenous contrast.

[Series 2: soft tissue · axial · 0.42mm/px · z∈[+304,+404]mm · 5 of 31 slices shown, 7 images]
[im 6/31  brain]
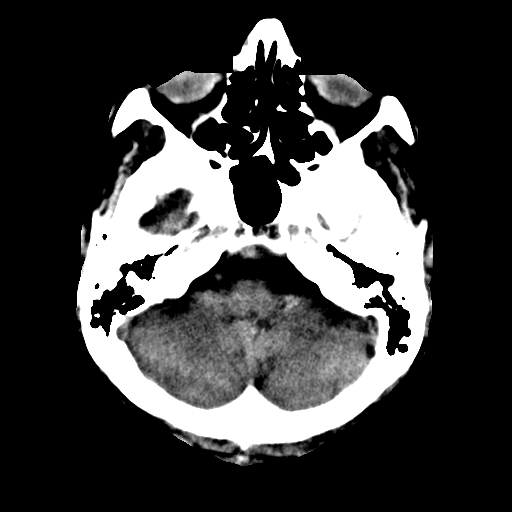
[im 6/31  bone]
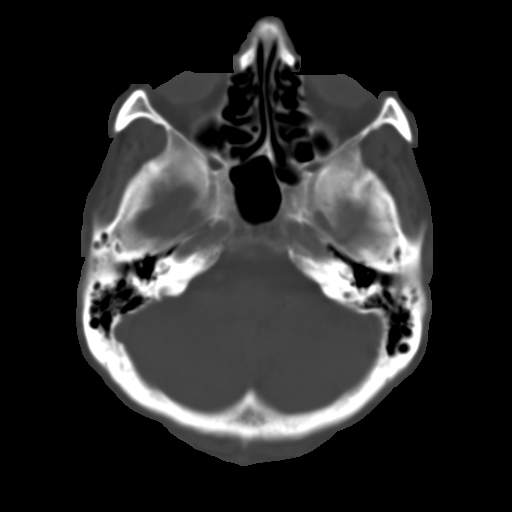
[im 11/31  brain]
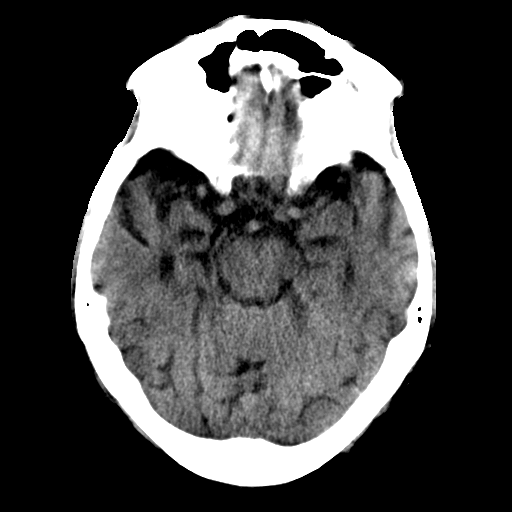
[im 16/31  brain]
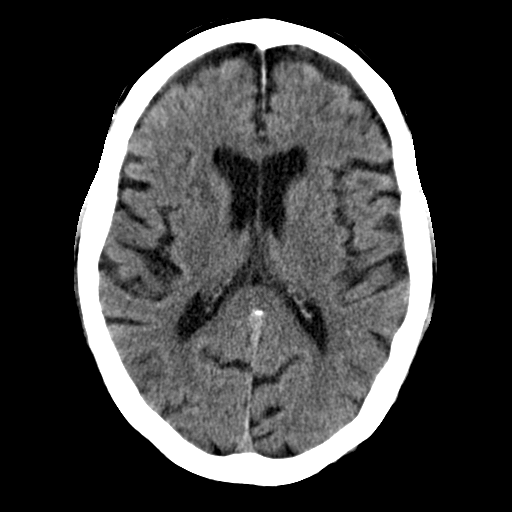
[im 21/31  brain]
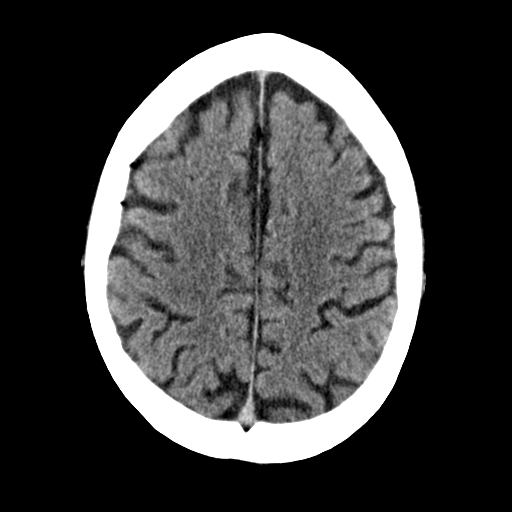
[im 26/31  brain]
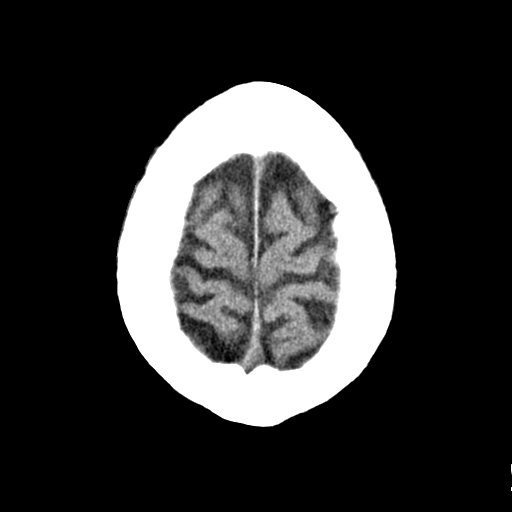
[im 26/31  bone]
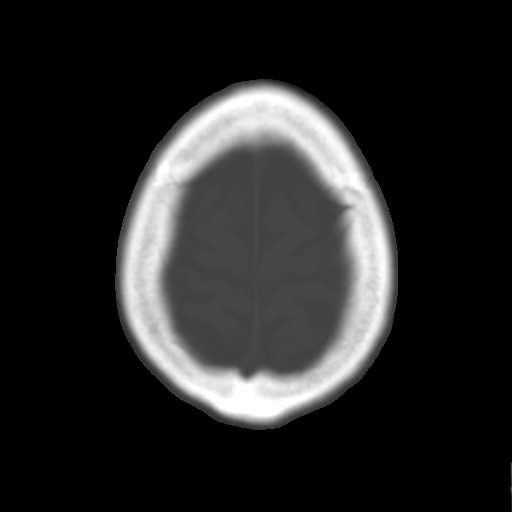

[Series 3: bone · axial · 0.42mm/px · z∈[+279,+429]mm · 8 of 87 slices shown]
[im 6/87  bone]
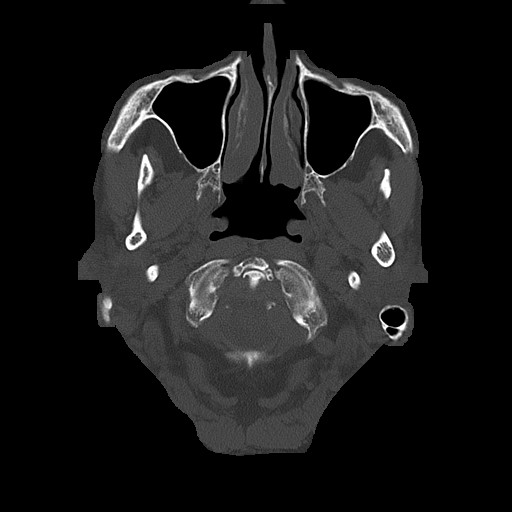
[im 17/87  bone]
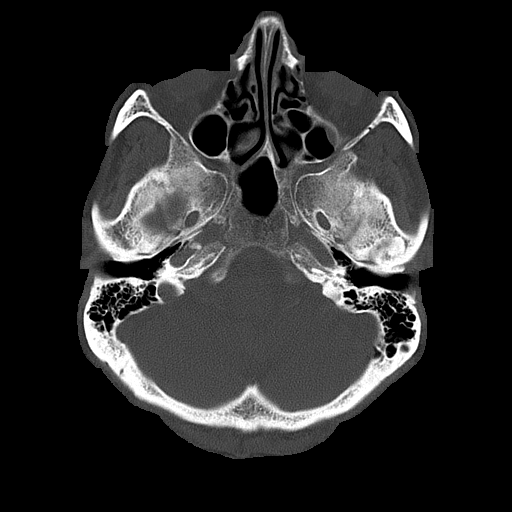
[im 27/87  bone]
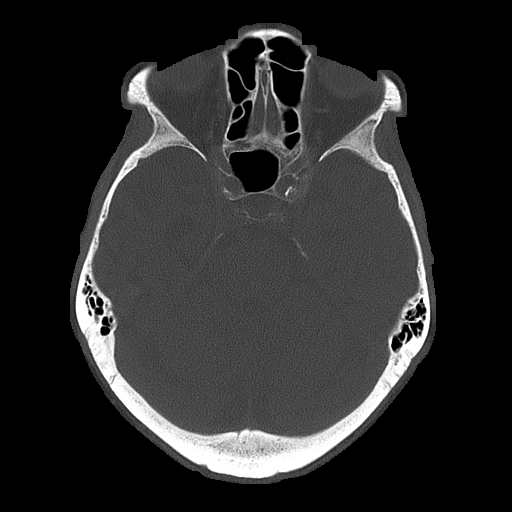
[im 38/87  bone]
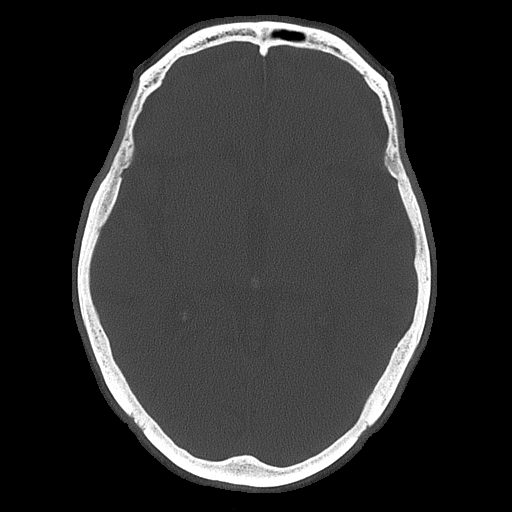
[im 49/87  bone]
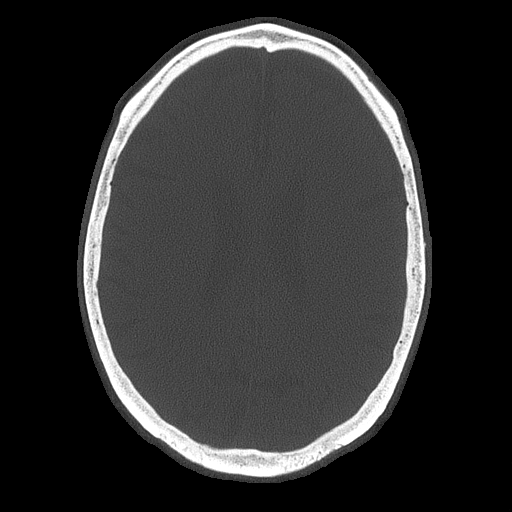
[im 60/87  bone]
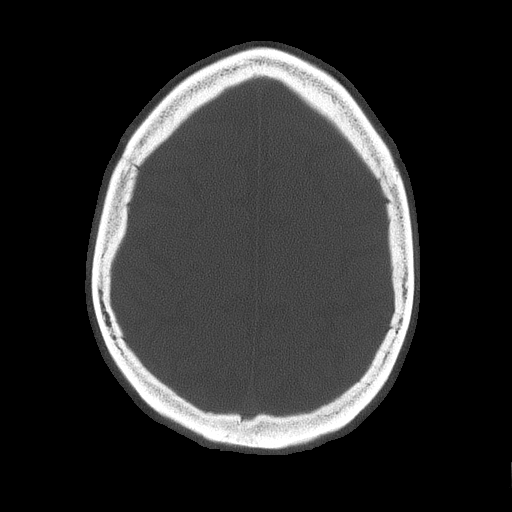
[im 70/87  bone]
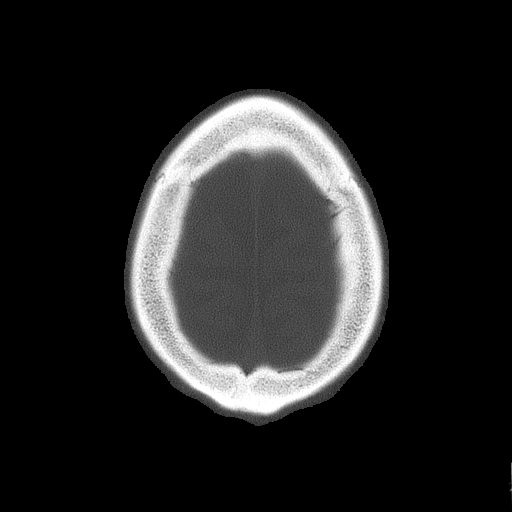
[im 81/87  bone]
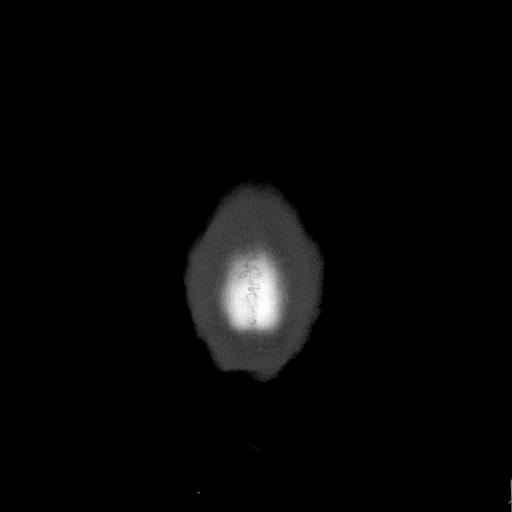

[Series 4: soft tissue recon · axial · 0.42mm/px · z∈[+352,+437]mm · 4 of 32 slices shown]
[im 7/32  brain]
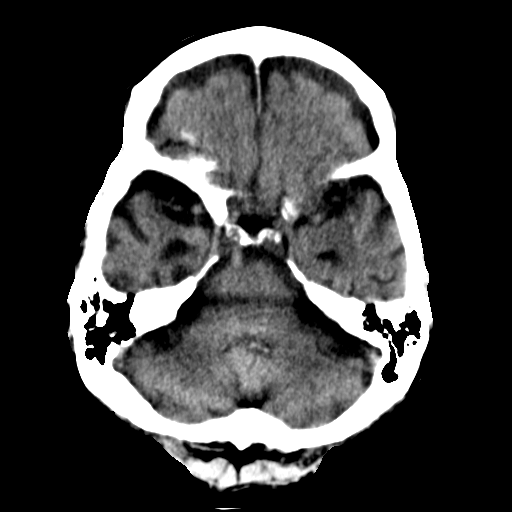
[im 13/32  brain]
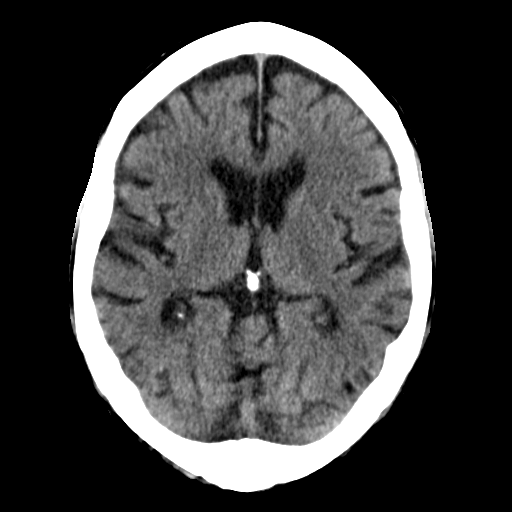
[im 19/32  brain]
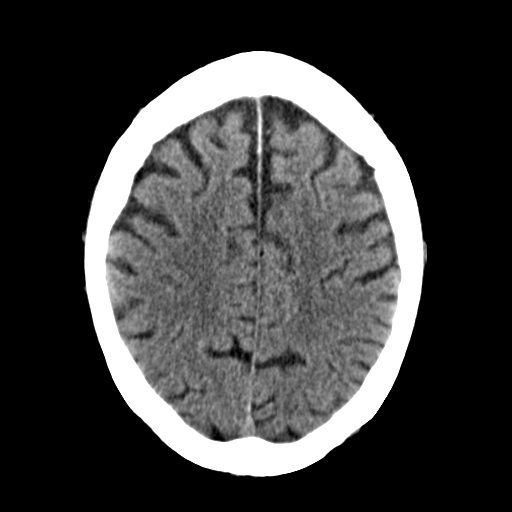
[im 25/32  brain]
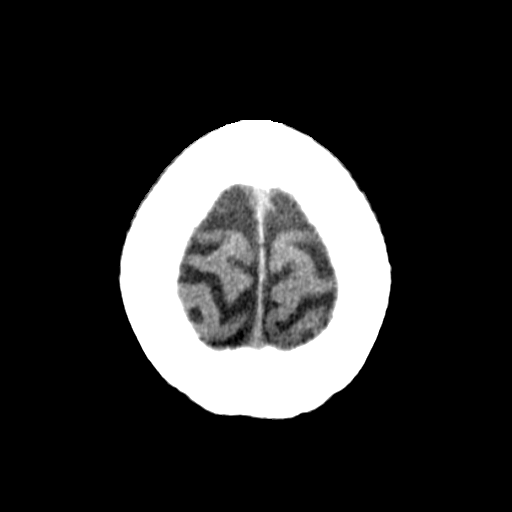

[17 of 30 positions shown; findings below may reference images not displayed]

FINDINGS: Visualized paranasal sinuses and mastoids are clear aside from mild
maxillary sinus mucosal thickening or small mucous retention cysts.
Calvarium appears intact. No acute osseous abnormality identified.
Visualized orbit soft tissues are within normal limits. No scalp
hematoma identified.

Calcified atherosclerosis at the skull base. Mild intracranial
artery dolichoectasia. Cerebral volume is within normal limits for
age. No midline shift, ventriculomegaly, mass effect, evidence of
mass lesion, intracranial hemorrhage or evidence of cortically based
acute infarction. Gray-white matter differentiation is within normal
limits throughout the brain. No suspicious intracranial vascular
hyperdensity.
IMPRESSION: Negative for age noncontrast CT appearance of the brain. No acute
traumatic injury identified.
